# Patient Record
Sex: Female | Born: 1950 | Race: White | Hispanic: No | Marital: Married | Smoking: Never smoker
Health system: Southern US, Community
[De-identification: ages and names within clinical notes are randomized; demographics above are authoritative.]

## PROBLEM LIST (undated history)

## (undated) DIAGNOSIS — F329 Major depressive disorder, single episode, unspecified: Secondary | ICD-10-CM

## (undated) DIAGNOSIS — I10 Essential (primary) hypertension: Secondary | ICD-10-CM

## (undated) DIAGNOSIS — F32A Depression, unspecified: Secondary | ICD-10-CM

## (undated) DIAGNOSIS — R002 Palpitations: Secondary | ICD-10-CM

## (undated) DIAGNOSIS — K769 Liver disease, unspecified: Secondary | ICD-10-CM

## (undated) DIAGNOSIS — E785 Hyperlipidemia, unspecified: Secondary | ICD-10-CM

## (undated) DIAGNOSIS — M8580 Other specified disorders of bone density and structure, unspecified site: Secondary | ICD-10-CM

## (undated) HISTORY — DX: Liver disease, unspecified: K76.9

## (undated) HISTORY — DX: Major depressive disorder, single episode, unspecified: F32.9

## (undated) HISTORY — DX: Hyperlipidemia, unspecified: E78.5

## (undated) HISTORY — DX: Palpitations: R00.2

## (undated) HISTORY — DX: Depression, unspecified: F32.A

## (undated) HISTORY — PX: THYROIDECTOMY, PARTIAL: SHX18

## (undated) HISTORY — DX: Other specified disorders of bone density and structure, unspecified site: M85.80

## (undated) HISTORY — DX: Essential (primary) hypertension: I10

---

## 2014-06-18 ENCOUNTER — Telehealth: Payer: Self-pay | Admitting: Cardiovascular Disease

## 2014-06-18 NOTE — Telephone Encounter (Signed)
Received records from [facility] (Dr Christena FlakeWilliam Zimmer) for appointment with Dr Tresa EndoKelly on 07/26/14.  [facility] for Dr Landry DykeKelly's schedule on 07/16/14.  lp

## 2014-07-26 ENCOUNTER — Encounter: Payer: Self-pay | Admitting: Cardiovascular Disease

## 2014-07-26 ENCOUNTER — Ambulatory Visit (INDEPENDENT_AMBULATORY_CARE_PROVIDER_SITE_OTHER): Payer: Medicare Other | Admitting: Cardiovascular Disease

## 2014-07-26 VITALS — BP 160/80 | HR 68 | Ht 62.0 in | Wt 147.2 lb

## 2014-07-26 DIAGNOSIS — E890 Postprocedural hypothyroidism: Secondary | ICD-10-CM

## 2014-07-26 DIAGNOSIS — R079 Chest pain, unspecified: Secondary | ICD-10-CM

## 2014-07-26 DIAGNOSIS — Z7901 Long term (current) use of anticoagulants: Secondary | ICD-10-CM

## 2014-07-26 DIAGNOSIS — Z9889 Other specified postprocedural states: Secondary | ICD-10-CM

## 2014-07-26 DIAGNOSIS — Z8659 Personal history of other mental and behavioral disorders: Secondary | ICD-10-CM

## 2014-07-26 DIAGNOSIS — Z79899 Other long term (current) drug therapy: Secondary | ICD-10-CM

## 2014-07-26 DIAGNOSIS — Z01818 Encounter for other preprocedural examination: Secondary | ICD-10-CM

## 2014-07-26 DIAGNOSIS — Z9009 Acquired absence of other part of head and neck: Secondary | ICD-10-CM

## 2014-07-26 DIAGNOSIS — R0789 Other chest pain: Secondary | ICD-10-CM

## 2014-07-26 DIAGNOSIS — R5383 Other fatigue: Secondary | ICD-10-CM

## 2014-07-26 DIAGNOSIS — R9431 Abnormal electrocardiogram [ECG] [EKG]: Secondary | ICD-10-CM

## 2014-07-26 DIAGNOSIS — I10 Essential (primary) hypertension: Secondary | ICD-10-CM

## 2014-07-26 LAB — CBC
HCT: 39.1 % (ref 36.0–46.0)
HEMOGLOBIN: 13.7 g/dL (ref 12.0–15.0)
MCH: 30.7 pg (ref 26.0–34.0)
MCHC: 35 g/dL (ref 30.0–36.0)
MCV: 87.7 fL (ref 78.0–100.0)
MPV: 9.7 fL (ref 8.6–12.4)
Platelets: 292 10*3/uL (ref 150–400)
RBC: 4.46 MIL/uL (ref 3.87–5.11)
RDW: 13.2 % (ref 11.5–15.5)
WBC: 11.9 10*3/uL — ABNORMAL HIGH (ref 4.0–10.5)

## 2014-07-26 MED ORDER — AMLODIPINE BESYLATE 5 MG PO TABS: 5.0000 mg | ORAL_TABLET | Freq: Every day | ORAL | Status: DC

## 2014-07-26 NOTE — Patient Instructions (Addendum)
START Amlodipine 5mg  daily.  This has been sent to [facility] in [place].  If you have no side effects please call after 2 weeks and we can send it to your mail order pharmacy.  Your physician has requested that you have a cardiac catheterization by Dr. Tresa EndoKelly. Cardiac catheterization is used to diagnose and/or treat various heart conditions. Doctors may recommend this procedure for a number of different reasons. The most common reason is to evaluate chest pain. Chest pain can be a symptom of coronary artery disease (CAD), and cardiac catheterization can show whether plaque is narrowing or blocking your heart's arteries. This procedure is also used to evaluate the valves, as well as measure the blood flow and oxygen levels in different parts of your heart. For further information please visit [email or web address] Please follow instruction sheet, as given.  Your physician recommends that you return for lab work in: 3-7 days prior to the cardiac cath along with a chest x-ray.(will order CXR day of cath.

## 2014-07-27 LAB — COMPREHENSIVE METABOLIC PANEL
ALBUMIN: 4.1 g/dL (ref 3.5–5.2)
ALK PHOS: 68 U/L (ref 39–117)
ALT: 51 U/L — ABNORMAL HIGH (ref 0–35)
AST: 40 U/L — ABNORMAL HIGH (ref 0–37)
BILIRUBIN TOTAL: 0.3 mg/dL (ref 0.2–1.2)
BUN: 23 mg/dL (ref 6–23)
CO2: 27 mEq/L (ref 19–32)
Calcium: 9.4 mg/dL (ref 8.4–10.5)
Chloride: 103 mEq/L (ref 96–112)
Creat: 1.14 mg/dL — ABNORMAL HIGH (ref 0.50–1.10)
GLUCOSE: 97 mg/dL (ref 70–99)
Potassium: 4.2 mEq/L (ref 3.5–5.3)
Sodium: 138 mEq/L (ref 135–145)
TOTAL PROTEIN: 6.9 g/dL (ref 6.0–8.3)

## 2014-07-27 LAB — TSH: TSH: 1.264 u[IU]/mL (ref 0.350–4.500)

## 2014-07-27 LAB — PROTIME-INR
INR: 1.02 (ref <1.50)
Prothrombin Time: 13.4 seconds (ref 11.6–15.2)

## 2014-07-27 LAB — APTT: aPTT: 29 seconds (ref 24–37)

## 2014-07-28 ENCOUNTER — Encounter: Payer: Self-pay | Admitting: Cardiovascular Disease

## 2014-07-28 DIAGNOSIS — R9431 Abnormal electrocardiogram [ECG] [EKG]: Secondary | ICD-10-CM | POA: Insufficient documentation

## 2014-07-28 DIAGNOSIS — I10 Essential (primary) hypertension: Secondary | ICD-10-CM | POA: Insufficient documentation

## 2014-07-28 DIAGNOSIS — E890 Postprocedural hypothyroidism: Secondary | ICD-10-CM | POA: Insufficient documentation

## 2014-07-28 DIAGNOSIS — R079 Chest pain, unspecified: Secondary | ICD-10-CM | POA: Insufficient documentation

## 2014-07-28 DIAGNOSIS — Z8659 Personal history of other mental and behavioral disorders: Secondary | ICD-10-CM | POA: Insufficient documentation

## 2014-07-28 DIAGNOSIS — Z9009 Acquired absence of other part of head and neck: Secondary | ICD-10-CM | POA: Insufficient documentation

## 2014-07-28 NOTE — Progress Notes (Addendum)
Patient ID: [name], female   DOB: 17-Jan-1951, 64 y.o.   MRN: [id number]     PATIENT PROFILE: [name] is a 64 y.o. female who is followed by Dr. Tanda Rockers in [place], [place].  She was referred for cardiology evaluation.  Her relative, [name], is one of my patients.   HPI:  [name] has a history of hypertension dating back for at least 2002, and most recently she has been maintained on lisinopril 10 mg and Ziac 5/6.25 mg daily.  She has experienced episodic chest pressure intermittently and had initially undergone a stress test a  7 years ago.  She also has a history of depression.  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  Recently, she has noticed increasing palpitations and also has developed episodes of chest tightness which seems to be occurring with less walking.  She underwent a nuclear perfusion study in July 2015 which was done as a Freight forwarder stress.  There were no symptoms during the study.  She was felt to have a small fixed anteroseptal defect without ischemia.  Ejection fraction was 39%, but was felt that this may not be accurate due to many PVCs during the study.  She subsequently underwent a 2-D echo Doppler study in October 2015 in [place] , which demonstrated an ejection fraction at 55-60%.  There was mild left ventricular posterior wall thickness.  Check equivocal tissue Doppler assessment.  The peak pressure gradient across her aortic valve was 7.6 mm with a mean pressure gradient of 4.3 mm without evidence for significant aortic stenosis.  The patient states that recently she has noticed more chest tightness and this seems to occur with only walking halfway down her driveway.  She now presents for cardiology evaluation.   Past medical history is notable for hypertension, palpitations, depressive disorder, osteoarthritis.   Past surgical history is notable for partial thyroidectomy while she was in eighth  grade.   Current Outpatient Prescriptions  Medication Sig Dispense Refill  . amLODipine (NORVASC) 5 MG tablet Take 1 tablet (5 mg total) by mouth daily. 30 tablet 3  . Ascorbic Acid (VITAMIN C) 1000 MG tablet Take 1,000 mg by mouth daily.    [name] BIOTIN 5000 PO Take by mouth.    . bisoprolol-hydrochlorothiazide (ZIAC) 5-6.25 MG per tablet Take by mouth.    . Calcium Carbonate (CALCIUM 600 PO) Take by mouth.    . Cholecalciferol (VITAMIN D-3 PO) Take by mouth.    . diclofenac (VOLTAREN) 50 MG EC tablet Take by mouth.    [name] lisinopril (PRINIVIL,ZESTRIL) 10 MG tablet Take by mouth.    . Omega-3 Fatty Acids (FISH OIL PO) Take by mouth daily.    . TURMERIC CURCUMIN PO Take by mouth daily.     No current facility-administered medications for this visit.   Social history is notable in that she is married for 39 years.  She does not have any children.  She lives with her husband.  She completed 12th grade of education.  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  There is no tobacco history or alcohol use.  She does care for many dogs.  Family History  Problem Relation Age of Onset  . Heart failure Mother    Family history is notable that her mother died at age 42 with possible congestive heart failure.  Her father died at 17 with sudden death.  He had COPD.  A brother died at 70 with  cancer and a sister died at 7656 with cancer.   ROS General: Negative; No fevers, chills, or night sweats HEENT: Negative; No changes in vision or hearing, sinus congestion, difficulty swallowing Pulmonary: Negative; No cough, wheezing, shortness of breath, hemoptysis Cardiovascular:  See HPI;  GI: Negative; No nausea, vomiting, diarrhea, or abdominal pain GU: Negative; No dysuria, hematuria, or difficulty voiding Musculoskeletal: Negative; no myalgias, joint pain, or weakness Hematologic/Oncologic: Negative; no easy bruising, bleeding Endocrine: History of partial  thyroidectomy Neuro: Negative; no changes in balance, headaches Skin: Negative; No rashes or skin lesions Psychiatric: Prior history of depression, currently stable Sleep: Negative; No daytime sleepiness, hypersomnolence, bruxism, restless legs, hypnogagnic hallucinations Other comprehensive 14 point system review is negative   Physical Exam BP 160/80 mmHg  Pulse 68  Ht 5\' 2"  (1.575 m)  Wt 147 lb 3.2 oz (66.769 kg)  BMI 26.92 kg/m2  General: Alert, oriented, no distress.  Skin: normal turgor, no rashes, warm and dry HEENT: Normocephalic, atraumatic. Pupils equal round and reactive to light; sclera anicteric; extraocular muscles intact; Fundi mild arteriolar narrowing without hemorrhages or exudates Nose without nasal septal hypertrophy Mouth/Parynx benign; Mallinpatti scale 3 Neck: No JVD, no carotid bruits; normal carotid upstroke Lungs: clear to ausculatation and percussion; no wheezing or rales Chest wall: without tenderness to palpitation Heart: PMI not displaced, RRR, s1 s2 normal, 1/6 systolic murmur, no diastolic murmur, no rubs, gallops, thrills, or heaves Abdomen: soft, nontender; no hepatosplenomehaly, BS+; abdominal aorta nontender and not dilated by palpation. Back: no CVA tenderness Pulses 2+ Musculoskeletal: full range of motion, normal strength, no joint deformities Extremities: no clubbing cyanosis or edema, Homan's sign negative  Neurologic: grossly nonfocal; Cranial nerves grossly wnl Psychologic: Normal mood and affect   ECG (independently read by me): Normal sinus rhythm at 68 bpm.  There is mild downsloping ST segment depression in leads II, III, and F V4 through V6, which is slightly more progressive since the patient's prior ECG done in [place].  LABS:  BMET    Component Value Date/Time   NA 138 07/26/2014 1553   K 4.2 07/26/2014 1553   CL 103 07/26/2014 1553   CO2 27 07/26/2014 1553   GLUCOSE 97 07/26/2014 1553   BUN 23 07/26/2014 1553    CREATININE 1.14* 07/26/2014 1553   CALCIUM 9.4 07/26/2014 1553     Hepatic Function Panel     Component Value Date/Time   PROT 6.9 07/26/2014 1553   ALBUMIN 4.1 07/26/2014 1553   AST 40* 07/26/2014 1553   ALT 51* 07/26/2014 1553   ALKPHOS 68 07/26/2014 1553   BILITOT 0.3 07/26/2014 1553     CBC    Component Value Date/Time   WBC 11.9* 07/26/2014 1553   RBC 4.46 07/26/2014 1553   HGB 13.7 07/26/2014 1553   HCT 39.1 07/26/2014 1553   PLT 292 07/26/2014 1553   MCV 87.7 07/26/2014 1553   MCH 30.7 07/26/2014 1553   MCHC 35.0 07/26/2014 1553   RDW 13.2 07/26/2014 1553     BNP No results found for: PROBNP  Lipid Panel  No results found for: CHOL, TRIG, HDL, CHOLHDL, VLDL, LDLCALC, LDLDIRECT    RADIOLOGY: No results found.   ASSESSMENT AND PLAN: My impression is that Ms.[name] is a [age] year old female who has at least a 13 year history of hypertension and currently has been on lisinopril 10 mg and bisoprolol/HCTZ 5/6.25 mg.  Her blood pressure today is elevated at 160/80.  She also has experienced progressive chest tightness.  A  prior nuclear perfusion study in July, raised the possibility of a fixed defect anteroseptal lead is felt that this was possibly due to left bundle branch block.  However, I reviewed the ECGs in the patient did not have left bundle branch block at the time of the stress test.  She did have mild RV conduction delay.  Her echo Doppler study has shown normal systolic function.  Her ECG today does show slight  progressive inferolateral ST segment changes.  She admits to fairly reproducible chest tightness and ultimately this does get better with activity if she walks slowly.  She does have mild mitral regurgitation.  I am electing to add amlodipine 5 mg, which be helpful both for her blood pressure control as well as potential for anti-ischemic benefit.  In light of her symptoms which may be compatible with ischemia and her ECG changes, I have recommended  definitive cardiac evaluation with cardiac catheterization.  At this point, I do not feel there is a need to repeat noninvasive assessment. The risks and benefits of a cardiac catheterization and possible coronary percutaneous intervention including, but not limited to, death, stroke, MI, kidney damage and bleeding were discussed with the patient who indicates understanding and agrees to proceed with this procedure for definitive assessment.  If stenting is necessary, there are no contraindications to DES stenting.  Laboratory will be sent.  I answered all her questions.  The catheterization will be scheduled at [facility] next week.   Lennette Bihari, MD, [facility] 07/28/2014 12:35 PM

## 2014-07-31 ENCOUNTER — Other Ambulatory Visit: Payer: Self-pay | Admitting: *Deleted

## 2014-07-31 DIAGNOSIS — R079 Chest pain, unspecified: Secondary | ICD-10-CM

## 2014-07-31 DIAGNOSIS — R0602 Shortness of breath: Secondary | ICD-10-CM

## 2014-08-03 ENCOUNTER — Ambulatory Visit (HOSPITAL_COMMUNITY)
Admission: RE | Admit: 2014-08-03 | Discharge: 2014-08-03 | Disposition: A | Discharge status: Home / Self Care | Payer: Medicare Other | Source: Ambulatory Visit | Attending: Cardiovascular Disease | Admitting: Cardiovascular Disease

## 2014-08-03 ENCOUNTER — Encounter (HOSPITAL_COMMUNITY): Payer: Self-pay | Admitting: Cardiovascular Disease

## 2014-08-03 ENCOUNTER — Encounter (HOSPITAL_COMMUNITY)
Admission: RE | Disposition: A | Discharge status: Home / Self Care | Payer: Self-pay | Source: Ambulatory Visit | Attending: Cardiovascular Disease

## 2014-08-03 ENCOUNTER — Ambulatory Visit (HOSPITAL_COMMUNITY): Payer: Medicare Other

## 2014-08-03 DIAGNOSIS — I2510 Atherosclerotic heart disease of native coronary artery without angina pectoris: Secondary | ICD-10-CM | POA: Diagnosis not present

## 2014-08-03 DIAGNOSIS — F329 Major depressive disorder, single episode, unspecified: Secondary | ICD-10-CM | POA: Diagnosis not present

## 2014-08-03 DIAGNOSIS — Z79899 Other long term (current) drug therapy: Secondary | ICD-10-CM | POA: Diagnosis not present

## 2014-08-03 DIAGNOSIS — R9431 Abnormal electrocardiogram [ECG] [EKG]: Secondary | ICD-10-CM | POA: Insufficient documentation

## 2014-08-03 DIAGNOSIS — M1990 Unspecified osteoarthritis, unspecified site: Secondary | ICD-10-CM | POA: Insufficient documentation

## 2014-08-03 DIAGNOSIS — R0602 Shortness of breath: Secondary | ICD-10-CM | POA: Insufficient documentation

## 2014-08-03 DIAGNOSIS — R0789 Other chest pain: Secondary | ICD-10-CM

## 2014-08-03 DIAGNOSIS — R002 Palpitations: Secondary | ICD-10-CM | POA: Diagnosis not present

## 2014-08-03 DIAGNOSIS — R079 Chest pain, unspecified: Secondary | ICD-10-CM | POA: Diagnosis present

## 2014-08-03 DIAGNOSIS — I341 Nonrheumatic mitral (valve) prolapse: Secondary | ICD-10-CM | POA: Insufficient documentation

## 2014-08-03 DIAGNOSIS — I10 Essential (primary) hypertension: Secondary | ICD-10-CM | POA: Diagnosis not present

## 2014-08-03 DIAGNOSIS — R001 Bradycardia, unspecified: Secondary | ICD-10-CM | POA: Insufficient documentation

## 2014-08-03 HISTORY — PX: LEFT HEART CATHETERIZATION WITH CORONARY ANGIOGRAM: SHX5451

## 2014-08-03 LAB — CK TOTAL AND CKMB (NOT AT ARMC)
CK, MB: 1.9 ng/mL (ref 0.3–4.0)
RELATIVE INDEX: INVALID (ref 0.0–2.5)
Total CK: 60 U/L (ref 7–177)

## 2014-08-03 SURGERY — LEFT HEART CATHETERIZATION WITH CORONARY ANGIOGRAM
Anesthesia: LOCAL

## 2014-08-03 MED ORDER — FENTANYL CITRATE 0.05 MG/ML IJ SOLN
INTRAMUSCULAR | Status: AC
  Filled 2014-08-03: qty 2

## 2014-08-03 MED ORDER — SODIUM CHLORIDE 0.9 % IV SOLN
INTRAVENOUS | Status: DC
  Administered 2014-08-03: 10:00:00 via INTRAVENOUS

## 2014-08-03 MED ORDER — SODIUM CHLORIDE 0.9 % IJ SOLN
3.0000 mL | INTRAMUSCULAR | Status: DC | PRN
  Administered 2014-08-03: 3 mL via INTRAVENOUS
  Filled 2014-08-03: qty 3

## 2014-08-03 MED ORDER — NITROGLYCERIN 1 MG/10 ML FOR IR/CATH LAB
INTRA_ARTERIAL | Status: AC
  Filled 2014-08-03: qty 10

## 2014-08-03 MED ORDER — HEPARIN (PORCINE) IN NACL 2-0.9 UNIT/ML-% IJ SOLN
INTRAMUSCULAR | Status: AC
  Filled 2014-08-03: qty 1500

## 2014-08-03 MED ORDER — SODIUM CHLORIDE 0.9 % IV SOLN: INTRAVENOUS | Status: DC

## 2014-08-03 MED ORDER — HEPARIN SODIUM (PORCINE) 1000 UNIT/ML IJ SOLN
INTRAMUSCULAR | Status: AC
  Filled 2014-08-03: qty 1

## 2014-08-03 MED ORDER — DIAZEPAM 5 MG PO TABS
5.0000 mg | ORAL_TABLET | ORAL | Status: AC
Start: 2014-08-03 — End: 2014-08-03
  Administered 2014-08-03: 5 mg via ORAL

## 2014-08-03 MED ORDER — VERAPAMIL HCL 2.5 MG/ML IV SOLN
INTRAVENOUS | Status: AC
  Filled 2014-08-03: qty 2

## 2014-08-03 MED ORDER — ASPIRIN 81 MG PO CHEW
CHEWABLE_TABLET | ORAL | Status: AC
  Administered 2014-08-03: 81 mg via ORAL
  Filled 2014-08-03: qty 1

## 2014-08-03 MED ORDER — MIDAZOLAM HCL 2 MG/2ML IJ SOLN
INTRAMUSCULAR | Status: AC
Start: 2014-08-03 — End: 2014-08-03
  Filled 2014-08-03: qty 2

## 2014-08-03 MED ORDER — ACETAMINOPHEN 325 MG PO TABS: 650.0000 mg | ORAL_TABLET | ORAL | Status: DC | PRN

## 2014-08-03 MED ORDER — ASPIRIN 81 MG PO CHEW
81.0000 mg | CHEWABLE_TABLET | ORAL | Status: AC
  Administered 2014-08-03: 81 mg via ORAL

## 2014-08-03 MED ORDER — ONDANSETRON HCL 4 MG/2ML IJ SOLN: 4.0000 mg | Freq: Four times a day (QID) | INTRAMUSCULAR | Status: DC | PRN

## 2014-08-03 MED ORDER — MIDAZOLAM HCL 2 MG/2ML IJ SOLN
INTRAMUSCULAR | Status: AC
  Filled 2014-08-03: qty 2

## 2014-08-03 MED ORDER — DIAZEPAM 5 MG PO TABS
ORAL_TABLET | ORAL | Status: AC
  Administered 2014-08-03: 5 mg via ORAL
  Filled 2014-08-03: qty 1

## 2014-08-03 MED ORDER — ASPIRIN EC 81 MG PO TBEC: 81.0000 mg | DELAYED_RELEASE_TABLET | Freq: Every day | ORAL | Status: DC

## 2014-08-03 NOTE — CV Procedure (Addendum)
[name] Sees is a 64 y.o. female   [id number]  [id number] LOCATION:  FACILITY: [facility]  PHYSICIAN: Lennette Biharihomas A. Kelly, MD, [facility] 1950-11-17   DATE OF PROCEDURE:  08/03/2014     CARDIAC CATHETERIZATION    HISTORY:  Ms. Rebbeca Paulancy Kaiser is a 64 year old female who is followed by Dr. Tanda RockersZimmer in [place], [place].  She has a long-standing history of hypertension, a history of palpitations, and recently had developed some exertional shortness of breath and episodes of chest tightness with walking.  A nuclear perfusion study raised the possibility of a small fixed anteroseptal defect without ischemia.  She has continued to experience symptoms.  She is now referred for definitive cardiac catheterization.   PROCEDURE:  Left heart catheterization via the right radial artery approach: Coronary angiography, selective administration of intracoronary nitroglycerin into the left coronary system, left ventriculography.  The patient was brought to the second floor [facility] cath lab in the postabsorptive state. The patient was premedicated with Versed 3 mg and fentanyl 75 mcg. A right radial approach was utilized after an Allen's test verified adequate circulation. The right radial artery was punctured via the Seldinger technique, and a 6 [place] Glidesheath Slender was inserted without difficulty.  A radial cocktail consisting of Verapamil, IV nitroglycerin, and lidocaine was administered. Weight adjusted heparin was administered. A safety J wire was advanced into the ascending aorta. Diagnostic catheterization was done with a 5 [place] TIG 4.0 catheter.  A 5 French pigtail catheter was used for left ventriculography. A TR radial band was applied for hemostasis. The patient left the catheterization laboratory in stable condition.   HEMODYNAMICS:   Central Aorta: 145/58  Left Ventricle: 145/17  ANGIOGRAPHY:   The left main coronary artery was angiographically normal and trifurcated into the  LAD, ramus intermediate and left circumflex coronary artery.   The LAD was a moderate size vessel which gave rise to a proximal diagonal vessel and several septal perforating arteries. The vessel extended to the LV apex.  There was a smooth area of 40-50% narrowing in the LAD after this first diagonal takeoff.  2 g of intracoronary nitroglycerin were injected which did not demonstrate any resolution of this smooth narrowing.  The remainder of the LAD was smooth and free of disease.  Ramus intermediate vessel is a moderate sized normal vessel  The left circumflex coronary artery was angiographically normal and gave rise to one major bifurcating obtuse marginal branch.   The RCA was angiographically normal it gave rise to a large PDA and PLA vessel.   Left ventriculography revealed normal global LV contractility without focal segmental wall motion abnormalities.  He was a suggestion of very mild angiographic mitral valve prolapse.  There was no evidence for significant mitral regurgitation. Ejection fraction is  55-60%   Total contrast used: 70 cc Omnipaque  IMPRESSION:  Mild single-vessel coronary obstructive disease with smooth 40-50% proximal LAD stenosis after the takeoff of the first diagonal vessel which did not significantly normalize following intracoronary nitroglycerin administration; normal ramus intermediate, left circumflex, and RCA.  Normal LV function without wall motion abnormalities.  Very mild angiographic mitral valve prolapse without significant mitral regurgitation   RECOMMENDATION:  Medical therapy.   Lennette Biharihomas A. Kelly, MD, [facility] 08/03/2014 12:50 PM

## 2014-08-03 NOTE — Interval H&P Note (Signed)
Cath Lab Visit (complete for each Cath Lab visit)  Clinical Evaluation Leading to the Procedure:   ACS: No.  Non-ACS:    Anginal Classification: CCS II  Anti-ischemic medical therapy: Maximal Therapy (2 or more classes of medications)  Non-Invasive Test Results: Intermediate-risk stress test findings: cardiac mortality 1-3%/year  Prior CABG: No previous CABG      History and Physical Interval Note:  08/03/2014 12:01 PM  Sarah MorningNancy Kaiser  has presented today for surgery, with the diagnosis of cp  The various methods of treatment have been discussed with the patient and family. After consideration of risks, benefits and other options for treatment, the patient has consented to  Procedure(s): LEFT HEART CATHETERIZATION WITH CORONARY ANGIOGRAM (N/A) as a surgical intervention .  The patient's history has been reviewed, patient examined, no change in status, stable for surgery.  I have reviewed the patient's chart and labs.  Questions were answered to the patient's satisfaction.     Starlynn Klinkner A

## 2014-08-03 NOTE — Discharge Instructions (Signed)
Radial Site Care °Refer to this sheet in the next few weeks. These instructions provide you with information on caring for yourself after your procedure. Your caregiver may also give you more specific instructions. Your treatment has been planned according to current medical practices, but problems sometimes occur. Call your caregiver if you have any problems or questions after your procedure. °HOME CARE INSTRUCTIONS °· You may shower the day after the procedure. Remove the bandage (dressing) and gently wash the site with plain soap and water. Gently pat the site dry. °· Do not apply powder or lotion to the site. °· Do not submerge the affected site in water for 3 to 5 days. °· Inspect the site at least twice daily. °· Do not flex or bend the affected arm for 24 hours. °· No lifting over 5 pounds (2.3 kg) for 5 days after your procedure. °· Do not drive home if you are discharged the same day of the procedure. Have someone else drive you. °· You may drive 24 hours after the procedure unless otherwise instructed by your caregiver. °· Do not operate machinery or power tools for 24 hours. °· A responsible adult should be with you for the first 24 hours after you arrive home. °What to expect: °· Any bruising will usually fade within 1 to 2 weeks. °· Blood that collects in the tissue (hematoma) may be painful to the touch. It should usually decrease in size and tenderness within 1 to 2 weeks. °SEEK IMMEDIATE MEDICAL CARE IF: °· You have unusual pain at the radial site. °· You have redness, warmth, swelling, or pain at the radial site. °· You have drainage (other than a small amount of blood on the dressing). °· You have chills. °· You have a fever or persistent symptoms for more than 72 hours. °· You have a fever and your symptoms suddenly get worse. °· Your arm becomes pale, cool, tingly, or numb. °· You have heavy bleeding from the site. Hold pressure on the site. °Document Released: 08/15/2010 Document Revised:  10/05/2011 Document Reviewed: 08/15/2010 °ExitCare® Patient Information ©2015 [employer]. This information is not intended to replace advice given to you by your health care provider. Make sure you discuss any questions you have with your health care provider. ° °

## 2014-08-03 NOTE — Progress Notes (Signed)
TR BAND REMOVAL  LOCATION:    right radial  DEFLATED PER PROTOCOL:    Yes.    TIME BAND OFF / DRESSING APPLIED:    1430   SITE UPON ARRIVAL:    Level 0  SITE AFTER BAND REMOVAL:    Level 0  CIRCULATION SENSATION AND MOVEMENT:    Within Normal Limits   Yes.    COMMENTS:   TRB REMOVED/ TEGADERM DSG APPLIED. 

## 2014-08-03 NOTE — H&P (View-Only) (Signed)
Patient ID: [name], female   DOB: 17-Jan-1951, 64 y.o.   MRN: [id number]     PATIENT PROFILE: [name] is a 64 y.o. female who is followed by Dr. Tanda Rockers in [place], [place].  She was referred for cardiology evaluation.  Her relative, [name], is one of my patients.   HPI:  [name] has a history of hypertension dating back for at least 2002, and most recently she has been maintained on lisinopril 10 mg and Ziac 5/6.25 mg daily.  She has experienced episodic chest pressure intermittently and had initially undergone a stress test a  7 years ago.  She also has a history of depression.  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  Recently, she has noticed increasing palpitations and also has developed episodes of chest tightness which seems to be occurring with less walking.  She underwent a nuclear perfusion study in July 2015 which was done as a Freight forward[employer] stress.  There were no symptoms during the study.  She was felt to have a small fixed anteroseptal defect without ischemia.  Ejection fraction was 39%, but was felt that this may not be accurate due to many PVCs during the study.  She subsequently underwent a 2-D echo Doppler study in October 2015 in [place] , which demonstrated an ejection fraction at 55-60%.  There was mild left ventricular posterior wall thickness.  Check equivocal tissue Doppler assessment.  The peak pressure gradient across her aortic valve was 7.6 mm with a mean pressure gradient of 4.3 mm without evidence for significant aortic stenosis.  The patient states that recently she has noticed more chest tightness and this seems to occur with only walking halfway down her driveway.  She now presents for cardiology evaluation.   Past medical history is notable for hypertension, palpitations, depressive disorder, osteoarthritis.   Past surgical history is notable for partial thyroidectomy while she was in eighth  grade.   Current Outpatient Prescriptions  Medication Sig Dispense Refill  . amLODipine (NORVASC) 5 MG tablet Take 1 tablet (5 mg total) by mouth daily. 30 tablet 3  . Ascorbic Acid (VITAMIN C) 1000 MG tablet Take 1,000 mg by mouth daily.    [name] BIOTIN 5000 PO Take by mouth.    . bisoprolol-hydrochlorothiazide (ZIAC) 5-6.25 MG per tablet Take by mouth.    . Calcium Carbonate (CALCIUM 600 PO) Take by mouth.    . Cholecalciferol (VITAMIN D-3 PO) Take by mouth.    . diclofenac (VOLTAREN) 50 MG EC tablet Take by mouth.    [name] lisinopril (PRINIVIL,ZESTRIL) 10 MG tablet Take by mouth.    . Omega-3 Fatty Acids (FISH OIL PO) Take by mouth daily.    . TURMERIC CURCUMIN PO Take by mouth daily.     No current facility-administered medications for this visit.   Social history is notable in that she is married for 39 years.  She does not have any children.  She lives with her husband.  She completed 12th grade of education.  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  There is no tobacco history or alcohol use.  She does care for many dogs.  Family History  Problem Relation Age of Onset  . Heart failure Mother    Family history is notable that her mother died at age 42 with possible congestive heart failure.  Her father died at 17 with sudden death.  He had COPD.  A brother died at 70 with  cancer and a sister died at 7656 with cancer.   ROS General: Negative; No fevers, chills, or night sweats HEENT: Negative; No changes in vision or hearing, sinus congestion, difficulty swallowing Pulmonary: Negative; No cough, wheezing, shortness of breath, hemoptysis Cardiovascular:  See HPI;  GI: Negative; No nausea, vomiting, diarrhea, or abdominal pain GU: Negative; No dysuria, hematuria, or difficulty voiding Musculoskeletal: Negative; no myalgias, joint pain, or weakness Hematologic/Oncologic: Negative; no easy bruising, bleeding Endocrine: History of partial  thyroidectomy Neuro: Negative; no changes in balance, headaches Skin: Negative; No rashes or skin lesions Psychiatric: Prior history of depression, currently stable Sleep: Negative; No daytime sleepiness, hypersomnolence, bruxism, restless legs, hypnogagnic hallucinations Other comprehensive 14 point system review is negative   Physical Exam BP 160/80 mmHg  Pulse 68  Ht 5\' 2"  (1.575 m)  Wt 147 lb 3.2 oz (66.769 kg)  BMI 26.92 kg/m2  General: Alert, oriented, no distress.  Skin: normal turgor, no rashes, warm and dry HEENT: Normocephalic, atraumatic. Pupils equal round and reactive to light; sclera anicteric; extraocular muscles intact; Fundi mild arteriolar narrowing without hemorrhages or exudates Nose without nasal septal hypertrophy Mouth/Parynx benign; Mallinpatti scale 3 Neck: No JVD, no carotid bruits; normal carotid upstroke Lungs: clear to ausculatation and percussion; no wheezing or rales Chest wall: without tenderness to palpitation Heart: PMI not displaced, RRR, s1 s2 normal, 1/6 systolic murmur, no diastolic murmur, no rubs, gallops, thrills, or heaves Abdomen: soft, nontender; no hepatosplenomehaly, BS+; abdominal aorta nontender and not dilated by palpation. Back: no CVA tenderness Pulses 2+ Musculoskeletal: full range of motion, normal strength, no joint deformities Extremities: no clubbing cyanosis or edema, Homan's sign negative  Neurologic: grossly nonfocal; Cranial nerves grossly wnl Psychologic: Normal mood and affect   ECG (independently read by me): Normal sinus rhythm at 68 bpm.  There is mild downsloping ST segment depression in leads II, III, and F V4 through V6, which is slightly more progressive since the patient's prior ECG done in [place].  LABS:  BMET    Component Value Date/Time   NA 138 07/26/2014 1553   K 4.2 07/26/2014 1553   CL 103 07/26/2014 1553   CO2 27 07/26/2014 1553   GLUCOSE 97 07/26/2014 1553   BUN 23 07/26/2014 1553    CREATININE 1.14* 07/26/2014 1553   CALCIUM 9.4 07/26/2014 1553     Hepatic Function Panel     Component Value Date/Time   PROT 6.9 07/26/2014 1553   ALBUMIN 4.1 07/26/2014 1553   AST 40* 07/26/2014 1553   ALT 51* 07/26/2014 1553   ALKPHOS 68 07/26/2014 1553   BILITOT 0.3 07/26/2014 1553     CBC    Component Value Date/Time   WBC 11.9* 07/26/2014 1553   RBC 4.46 07/26/2014 1553   HGB 13.7 07/26/2014 1553   HCT 39.1 07/26/2014 1553   PLT 292 07/26/2014 1553   MCV 87.7 07/26/2014 1553   MCH 30.7 07/26/2014 1553   MCHC 35.0 07/26/2014 1553   RDW 13.2 07/26/2014 1553     BNP No results found for: PROBNP  Lipid Panel  No results found for: CHOL, TRIG, HDL, CHOLHDL, VLDL, LDLCALC, LDLDIRECT    RADIOLOGY: No results found.   ASSESSMENT AND PLAN: My impression is that Ms.[name] is a [age] year old female who has at least a 13 year history of hypertension and currently has been on lisinopril 10 mg and bisoprolol/HCTZ 5/6.25 mg.  Her blood pressure today is elevated at 160/80.  She also has experienced progressive chest tightness.  A  prior nuclear perfusion study in July, raised the possibility of a fixed defect anteroseptal lead is felt that this was possibly due to left bundle branch block.  However, I reviewed the ECGs in the patient did not have left bundle branch block at the time of the stress test.  She did have mild RV conduction delay.  Her echo Doppler study has shown normal systolic function.  Her ECG today does show slight  progressive inferolateral ST segment changes.  She admits to fairly reproducible chest tightness and ultimately this does get better with activity if she walks slowly.  She does have mild mitral regurgitation.  I am electing to add amlodipine 5 mg, which be helpful both for her blood pressure control as well as potential for anti-ischemic benefit.  In light of her symptoms which may be compatible with ischemia and her ECG changes, I have recommended  definitive cardiac evaluation with cardiac catheterization.  At this point, I do not feel there is a need to repeat noninvasive assessment. The risks and benefits of a cardiac catheterization and possible coronary percutaneous intervention including, but not limited to, death, stroke, MI, kidney damage and bleeding were discussed with the patient who indicates understanding and agrees to proceed with this procedure for definitive assessment.  If stenting is necessary, there are no contraindications to DES stenting.  Laboratory will be sent.  I answered all her questions.  The catheterization will be scheduled at [facility] next week.   Lennette Bihari, MD, [facility] 07/28/2014 12:35 PM

## 2014-08-03 NOTE — Research (Signed)
Bioflow Study Informed Consent   Subject Name: [name]  Subject met inclusion and exclusion criteria.  The informed consent form, study requirements and expectations were reviewed with the subject and questions and concerns were addressed prior to the signing of the consent form.  The subject verbalized understanding of the trail requirements.  The subject agreed to participate in the Bioflow trial and signed the informed consent.  The informed consent was obtained prior to performance of any protocol-specific procedures for the subject.  A copy of the signed informed consent was given to the subject and a copy was placed in the subject's medical record.  Sandie Ano 08/03/2014, 10:45

## 2014-08-06 ENCOUNTER — Telehealth: Payer: Self-pay | Admitting: Cardiovascular Disease

## 2014-08-06 MED ORDER — AMLODIPINE BESYLATE 5 MG PO TABS: 5.0000 mg | ORAL_TABLET | Freq: Every day | ORAL | Status: DC

## 2014-08-06 NOTE — Telephone Encounter (Signed)
Spoke with pt, refill sent to the pharmacy and 2 wk follow up scheduled.

## 2014-08-06 NOTE — Telephone Encounter (Signed)
Pt need a new prescription for her Amlodipine Bestylate 5 mg #90 and refills. Please send to [facility]. Pt had a Cath on Friday,when does she need her next appt?

## 2014-08-24 ENCOUNTER — Ambulatory Visit (INDEPENDENT_AMBULATORY_CARE_PROVIDER_SITE_OTHER): Payer: Medicare Other | Admitting: Cardiology

## 2014-08-24 ENCOUNTER — Encounter: Payer: Self-pay | Admitting: Cardiology

## 2014-08-24 VITALS — BP 110/70 | HR 76 | Ht 62.0 in | Wt 146.8 lb

## 2014-08-24 DIAGNOSIS — I2510 Atherosclerotic heart disease of native coronary artery without angina pectoris: Secondary | ICD-10-CM

## 2014-08-24 DIAGNOSIS — R945 Abnormal results of liver function studies: Principal | ICD-10-CM

## 2014-08-24 DIAGNOSIS — R7989 Other specified abnormal findings of blood chemistry: Secondary | ICD-10-CM

## 2014-08-24 LAB — LIPID PANEL
CHOL/HDL RATIO: 4.3 ratio
CHOLESTEROL: 187 mg/dL (ref 0–200)
HDL: 44 mg/dL (ref >39)
LDL CALC: 113 mg/dL — AB (ref 0–99)
TRIGLYCERIDES: 148 mg/dL (ref <150)
VLDL: 30 mg/dL (ref 0–40)

## 2014-08-24 LAB — HEPATIC FUNCTION PANEL
ALT: 49 U/L — AB (ref 0–35)
AST: 36 U/L (ref 0–37)
Albumin: 4.2 g/dL (ref 3.5–5.2)
Alkaline Phosphatase: 64 U/L (ref 39–117)
BILIRUBIN DIRECT: 0.1 mg/dL (ref 0.0–0.3)
BILIRUBIN INDIRECT: 0.5 mg/dL (ref 0.2–1.2)
Total Bilirubin: 0.6 mg/dL (ref 0.2–1.2)
Total Protein: 6.8 g/dL (ref 6.0–8.3)

## 2014-08-24 NOTE — Progress Notes (Signed)
08/24/2014 Sarah MorningNancy Kaiser   [birth year]  [id number]  Primary Physician: Christena FlakeZIMMER,WILLIAM, MD Primary Cardiologist: Dr. Tresa EndoKelly  Reason for visit/chief complaint: Post hospital follow-up for CAD  HPI:  The patient is a 64 y/o female, followed by Dr. Tresa EndoKelly, with a long-standing history of hypertension, a history of palpitations, and recently had developed some exertional shortness of breath and episodes of chest tightness with walking. A nuclear perfusion study raised the possibility of a small fixed anteroseptal defect without ischemia. Sbsequently, she was referred for definitive cardiac catheterization. This was performed by Dr. Tresa EndoKelly on 08/03/13 and revealed mild single-vessel coronary obstructive disease with smooth 40-50% proximal LAD stenosis after the takeoff of the first diagonal vessel which did not significantly normalize following intracoronary nitroglycerin administration; normal ramus intermediate, left circumflex, and RCA.  She was noted to have normal LV function without wall motion abnormalities and very mild angiographic mitral valve prolapse without significant mitral regurgitation.    Also of note, patient had elevated LFTs at time of CMP during hospitalization. AST was 40 and ALT was 51.    She presents to clinic today for post catheterization follow-up. She reports that she has done relatively well since discharge. She denies any recurrent chest pain however she also admits that she has not been very active due to right knee osteoarthritis. She denies shortness of breath. Her right radial cath site is well-healed without complications. She reports full medication compliance.   Current Outpatient Prescriptions  Medication Sig Dispense Refill  . alendronate (FOSAMAX) 70 MG tablet Take 70 mg by mouth once a week. Take with a full glass of water on an empty stomach.    [name]. amLODipine (NORVASC) 5 MG tablet Take 1 tablet (5 mg total) by mouth daily. 90 tablet 3  . Ascorbic Acid  (VITAMIN C) 1000 MG tablet Take 1,000 mg by mouth daily.    [name]. BIOTIN 5000 PO Take by mouth.    . bisoprolol-hydrochlorothiazide (ZIAC) 5-6.25 MG per tablet Take 1 tablet by mouth every Kaiser.     . Calcium Carbonate (CALCIUM 600 PO) Take by mouth.    . Cholecalciferol (VITAMIN D-3 PO) Take by mouth.    . diclofenac (VOLTAREN) 50 MG EC tablet Take 50 mg by mouth 2 (two) times daily.     [name]. lisinopril (PRINIVIL,ZESTRIL) 10 MG tablet Take 10 mg by mouth every Kaiser.     . Omega-3 Fatty Acids (FISH OIL PO) Take by mouth daily.    . TURMERIC CURCUMIN PO Take by mouth daily.     No current facility-administered medications for this visit.    No Known Allergies  History   Social History  . Marital Status: Married    Spouse Name: N/A    Number of Children: N/A  . Years of Education: N/A   Occupational History  . Not on file.   Social History Main Topics  . Smoking status: Never Smoker   . Smokeless tobacco: Not on file  . Alcohol Use: No  . Drug Use: No  . Sexual Activity: Not on file   Other Topics Concern  . Not on file   Social History Narrative    Family History  Problem Relation Age of Onset  . Heart failure Mother      Review of Systems: General: negative for chills, fever, night sweats or weight changes.  Cardiovascular: negative for chest pain, dyspnea on exertion, edema, orthopnea, palpitations, paroxysmal nocturnal dyspnea or shortness of breath Dermatological: negative for rash Respiratory: negative  for cough or wheezing Urologic: negative for hematuria Abdominal: negative for nausea, vomiting, diarrhea, bright red blood per rectum, melena, or hematemesis Neurologic: negative for visual changes, syncope, or dizziness All other systems reviewed and are otherwise negative except as noted above.    Blood pressure 110/70, pulse 76, height  (1.575 m), weight 146 lb 12.8 oz (66.588 kg), last menstrual period 07/27/1998.  General appearance: alert,  cooperative and no distress Neck: no carotid bruit and no JVD Lungs: clear to auscultation bilaterally Heart: regular rate and rhythm, S1, S2 normal, no murmur, click, rub or gallop Extremities: no LEE Pulses: 2+ and symmetric Skin: warm and dry Neurologic: Grossly normal  EKG not performed  ASSESSMENT AND PLAN:   1. CAD: Left heart catheterization revealed mild single-vessel coronary obstructive disease with smooth 40-50% proximal LAD stenosis. LV function was normal without wall motion abnormalities. Medical therapy was recommended. Statin therapy was not initiated due to elevated LFTs. It appears that she also has not been taking daily aspirin. Given mild disease and other risk factors, I recommend she start taking low dose daily aspirin, 81 mg. Continue beta blocker therapy with bisoprolol. Continue amlodipine. We also reviewed the concerning symptoms for acute coronary syndrome. We also discussed proper use of sublingual nitroglycerin. We will order sublingual nitroglycerin to be used as needed  2. Hypertension: Well-controlled at 110/70. Continue current medication regimen.  3. Elevated LFTs: AST was 40 and ALT was 51 4 weeks ago. Will recheck LFTs to insure that levels have normalized.  4. Screening for hyperlipidemia: Given known coronary disease, we'll check a fasting lipid panel to assess cholesterol levels. If needed, we will initiate statin therapy if LFTs have normalized.  PLAN  Start low dose daily aspirin. Continue medications for hypertension. Screening for hyperlipidemia. Recheck LFTs. We'll possibly initiate statin therapy based on lab findings. Follow-up with Dr. Tresa Endo in 3-4 months for reassessment.  Jaime Dome, BRITTAINYPA-C 08/24/2014 11:13 AM

## 2014-08-24 NOTE — Patient Instructions (Signed)
Your physician has recommended you make the following change in your medication: [place] recommends that you start a low dose aspirin daily.  Your physician recommends that you return for lab work fasting.   Your physician recommends that you schedule a follow-up appointment in: 3-4 months with Dr. Tresa EndoKelly.

## 2014-09-03 ENCOUNTER — Telehealth: Payer: Self-pay | Admitting: *Deleted

## 2014-09-03 ENCOUNTER — Other Ambulatory Visit: Payer: Self-pay | Admitting: *Deleted

## 2014-09-03 DIAGNOSIS — R945 Abnormal results of liver function studies: Secondary | ICD-10-CM

## 2014-09-03 DIAGNOSIS — E785 Hyperlipidemia, unspecified: Secondary | ICD-10-CM

## 2014-09-03 DIAGNOSIS — Z79899 Other long term (current) drug therapy: Secondary | ICD-10-CM

## 2014-09-03 DIAGNOSIS — R7989 Other specified abnormal findings of blood chemistry: Secondary | ICD-10-CM

## 2014-09-03 MED ORDER — NITROGLYCERIN 0.4 MG SL SUBL: 0.4000 mg | SUBLINGUAL_TABLET | SUBLINGUAL | Status: DC | PRN

## 2014-09-03 NOTE — Telephone Encounter (Signed)
-----   Message from Allayne ButcherBrittainy M Simmons, [place] sent at 08/31/2014  5:03 PM EST ----- LFTs improving. AST back to normal but ALT slightly elevated.  LDL is elevated at 113. She has known coronary disease. LDL goal is <70. Will defer initiation of statin to Dr. Tresa EndoKelly, given mild LFT elevation. May try diet and exercise and repeat in 6 weeks.

## 2014-09-03 NOTE — Telephone Encounter (Signed)
Called and notified patient of lab results and recommendations. She also requested a prescription for sublingual nitro. Stated that at her last appointment with Boyce MediciBrittany Simmons she told her that she wanted her to have this. Prescription was sent to [facility].

## 2014-09-05 ENCOUNTER — Encounter: Payer: Self-pay | Admitting: Cardiovascular Disease

## 2014-09-10 ENCOUNTER — Encounter: Payer: Self-pay | Admitting: Cardiovascular Disease

## 2014-09-25 ENCOUNTER — Telehealth: Payer: Self-pay | Admitting: *Deleted

## 2014-09-25 MED ORDER — ATORVASTATIN CALCIUM 20 MG PO TABS: 20.0000 mg | ORAL_TABLET | Freq: Every day | ORAL | Status: DC

## 2014-09-25 NOTE — Telephone Encounter (Signed)
Called and informed patient of lab results and recommendations. Atorvastatin prescription sent to [facility].

## 2014-09-25 NOTE — Telephone Encounter (Signed)
-----   Message from Lennette Biharihomas A Kelly, MD sent at 09/09/2014 12:51 PM EST ----- Mild LAD disease at cath. LDL 133; consider atorvastatin 20 mg to get LDL <70

## 2014-11-26 ENCOUNTER — Ambulatory Visit (INDEPENDENT_AMBULATORY_CARE_PROVIDER_SITE_OTHER): Payer: Medicare Other | Admitting: Cardiovascular Disease

## 2014-11-26 ENCOUNTER — Encounter: Payer: Self-pay | Admitting: Cardiovascular Disease

## 2014-11-26 VITALS — BP 142/66 | HR 52 | Ht 61.0 in | Wt 139.8 lb

## 2014-11-26 DIAGNOSIS — I10 Essential (primary) hypertension: Secondary | ICD-10-CM | POA: Diagnosis not present

## 2014-11-26 DIAGNOSIS — E785 Hyperlipidemia, unspecified: Secondary | ICD-10-CM

## 2014-11-26 DIAGNOSIS — R0602 Shortness of breath: Secondary | ICD-10-CM

## 2014-11-26 DIAGNOSIS — I2510 Atherosclerotic heart disease of native coronary artery without angina pectoris: Secondary | ICD-10-CM | POA: Diagnosis not present

## 2014-11-26 MED ORDER — ATORVASTATIN CALCIUM 40 MG PO TABS: 40.0000 mg | ORAL_TABLET | Freq: Every day | ORAL | Status: DC

## 2014-11-26 NOTE — Patient Instructions (Signed)
Your physician has recommended you make the following change in your medication: increase the atorvastatin to 40 mg daily.  Cut the bisoprolol-hydrochlorothiazide into 1/2 tablet daily.  Your physician recommends that you return for lab work in: 3 months.  Your physician wants you to follow-up in: 6 months or sooner if needed. You will receive a reminder letter in the mail two months in advance. If you don't receive a letter, please call our office to schedule the follow-up appointment.

## 2014-11-27 ENCOUNTER — Encounter: Payer: Self-pay | Admitting: Cardiovascular Disease

## 2014-11-27 DIAGNOSIS — E785 Hyperlipidemia, unspecified: Secondary | ICD-10-CM | POA: Insufficient documentation

## 2014-11-27 DIAGNOSIS — I2510 Atherosclerotic heart disease of native coronary artery without angina pectoris: Secondary | ICD-10-CM | POA: Insufficient documentation

## 2014-11-27 NOTE — Progress Notes (Signed)
Patient ID: [name], female   DOB: [birth year], 64 y.o.   MRN: [id number]    HPI:  [name] is a 64 y.o. female who is followed by Dr. Tanda Rockers in [place], [place].  She was referred for [facility] cardiology evaluation in December 2015.   Her relative, [name], is one of my patients.  She presents to the office today in follow-up of her cardiac catheterization.   [name] has a history of hypertension dating back for at least 2002, and  recently  has been maintained on lisinopril 10 mg and Ziac 5/6.25 mg daily.  She has experienced episodic chest pressure intermittently and had initially undergone a stress test a  7 years ago.  She also has a history of depression. 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  Recently, she has noticed increasing palpitations and also has developed episodes of chest tightness which seems to be occurring with less walking.  She underwent a nuclear perfusion study in July 2015 which was done as a Fre[employer] stress.  There were no symptoms during the study.  She was felt to have a small fixed anteroseptal defect without ischemia.  Ejection fraction was 39%, but was felt that this may not be accurate due to many PVCs during the study.  She subsequently underwent a 2-D echo Doppler study in October 2015 in [place] , which demonstrated an ejection fraction at 55-60%.  There was mild left ventricular posterior wall thickness.  Check equivocal tissue Doppler assessment.  The peak pressure gradient across her aortic valve was 7.6 mm with a mean pressure gradient of 4.3 mm without evidence for significant aortic stenosis.  The patient states that recently she has noticed more chest tightness and this seems to occur with only walking halfway down her driveway.    1.  I initially saw her, I was concerned about her symptoms.  I added amlodipine 5 mg to help both with blood pressure control as well as potential anti-ischemic benefit.  Her  ECG changes in symptomatology.  Definitive cardiac catheterization was recommended.  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  Otherwise her coronary anatomy was normal.  Showed normal LV function without wall motion abnormalities and evidence for very mild intragraft mitral valve prolapse without significant mitral regurgitation.  She was started on statin therapy with her LDL of 113 attempt to induce plaque stability and regression.  She initially saw [name] for post hospital follow-up on 08/24/2014.  At that time, her blood pressure was well controlled at 110/70.  Presently, she feels well.  She denies any recurrent chest pain.  She does note less shortness of breath.  She has been maintained on amlodipine 5 mg Ziac 5/6.25 mg, lisinopril 10 mg daily.  She's now on atorvastatin 20 mg daily.  Recent blood work was obtained on 11/08/2014 which showed a total cholesterol 236, LDL 163, triglycerides 124.  She presents for evaluation.  Past medical history is notable for hypertension, palpitations, depressive disorder, osteoarthritis.  Past surgical history is notable for partial thyroidectomy while she was in eighth grade.   Current Outpatient Prescriptions  Medication Sig Dispense Refill  . alendronate (FOSAMAX) 70 MG tablet Take 70 mg by mouth once a week. Take with a full glass of water on an empty stomach.    [name] amLODipine (NORVASC) 5 MG tablet Take 1  tablet (5 mg total) by mouth daily. 90 tablet 3  . Ascorbic Acid (VITAMIN C) 1000 MG tablet Take 1,000 mg by mouth daily.    [name]. aspirin 81 MG tablet Take 81 mg by mouth daily.    [name]. BIOTIN 5000 PO Take by mouth.    . bisoprolol-hydrochlorothiazide (ZIAC) 5-6.25 MG per tablet Take 0.5 tablets by mouth every Kaiser.    . Calcium Carbonate (CALCIUM 600 PO) Take by  mouth.    . Cholecalciferol (VITAMIN D-3 PO) Take by mouth.    . diclofenac (VOLTAREN) 50 MG EC tablet Take 50 mg by mouth 2 (two) times daily.     [name]. lisinopril (PRINIVIL,ZESTRIL) 10 MG tablet Take 10 mg by mouth every Kaiser.     . nitroGLYCERIN (NITROSTAT) 0.4 MG SL tablet Place 1 tablet (0.4 mg total) under the tongue every 5 (five) minutes as needed for chest pain. 25 tablet 3  . Omega-3 Fatty Acids (FISH OIL PO) Take by mouth daily.    . TURMERIC CURCUMIN PO Take by mouth daily.    [name]. atorvastatin (LIPITOR) 40 MG tablet Take 1 tablet (40 mg total) by mouth daily. 90 tablet 3  . traMADol (ULTRAM) 50 MG tablet Take 1 tablet by mouth daily as needed.     No current facility-administered medications for this visit.   Social history is notable in that she is married for 39 years.  She does not have any children.  She lives with her husband.  She completed 12th grade of education.  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  There is no tobacco history or alcohol use.  She does care for many dogs.  Family History  Problem Relation Age of Onset  . Heart failure Mother    Family history is notable that her mother died at age 66 with possible congestive heart failure.  Her father died at 1078 with sudden death.  He had COPD.  A brother died at 2357 with cancer and a sister died at 3956 with cancer.   ROS General: Negative; No fevers, chills, or night sweats HEENT: Negative; No changes in vision or hearing, sinus congestion, difficulty swallowing Pulmonary: Negative; No cough, wheezing, shortness of breath, hemoptysis Cardiovascular:  See HPI;  GI: Negative; No nausea, vomiting, diarrhea, or abdominal pain GU: Negative; No dysuria, hematuria, or difficulty voiding Musculoskeletal: Positive for arthritis in her right knee Hematologic/Oncologic: Negative; no easy bruising, bleeding Endocrine: History of partial thyroidectomy Neuro: Negative; no changes in  balance, headaches Skin: Negative; No rashes or skin lesions Psychiatric: Prior history of depression, currently stable Sleep: Negative; No daytime sleepiness, hypersomnolence, bruxism, restless legs, hypnogagnic hallucinations Other comprehensive 14 point system review is negative   Physical Exam BP 142/66 mmHg  Pulse 52  Ht 5\' 1"  (1.549 m)  Wt 139 lb 12.8 oz (63.413 kg)  BMI 26.43 kg/m2  General: Alert, oriented, no distress.  Skin: normal turgor, no rashes, warm and dry HEENT: Normocephalic, atraumatic. Pupils equal round and reactive to light; sclera anicteric; extraocular muscles intact; Fundi mild arteriolar narrowing without hemorrhages or exudates Nose without nasal septal hypertrophy Mouth/Parynx benign; Mallinpatti scale 3 Neck: No JVD, no carotid bruits; normal carotid upstroke Lungs: clear to ausculatation and percussion; no wheezing or rales Chest wall: without tenderness to palpitation Heart: PMI not displaced, RRR, s1 s2 normal, 1/6 systolic murmur, no diastolic murmur, no rubs, gallops, thrills, or heaves Abdomen: soft, nontender; no hepatosplenomehaly, BS+; abdominal aorta nontender and  not dilated by palpation. Back: no CVA tenderness Pulses 2+ Musculoskeletal: full range of motion, normal strength, no joint deformities Extremities: no clubbing cyanosis or edema, Homan's sign negative  Neurologic: grossly nonfocal; Cranial nerves grossly wnl Psychologic: Normal mood and affect  ECG (independently read by me): Sinus bradycardia 52 bpm.  ST changes improved.  QTc interval 381 ms.   07/26/2014 ECG (independently read by me): Normal sinus rhythm at 68 bpm.  There is mild downsloping ST segment depression in leads II, III, and F V4 through V6, which is slightly more progressive since the patient's prior ECG done in [place].  LABS:  BMET    Component Value Date/Time   NA 138 07/26/2014 1553   K 4.2 07/26/2014 1553   CL 103 07/26/2014 1553   CO2 27  07/26/2014 1553   GLUCOSE 97 07/26/2014 1553   BUN 23 07/26/2014 1553   CREATININE 1.14* 07/26/2014 1553   CALCIUM 9.4 07/26/2014 1553     Hepatic Function Panel     Component Value Date/Time   PROT 6.8 08/24/2014 1202   ALBUMIN 4.2 08/24/2014 1202   AST 36 08/24/2014 1202   ALT 49* 08/24/2014 1202   ALKPHOS 64 08/24/2014 1202   BILITOT 0.6 08/24/2014 1202   BILIDIR 0.1 08/24/2014 1202   IBILI 0.5 08/24/2014 1202     CBC    Component Value Date/Time   WBC 11.9* 07/26/2014 1553   RBC 4.46 07/26/2014 1553   HGB 13.7 07/26/2014 1553   HCT 39.1 07/26/2014 1553   PLT 292 07/26/2014 1553   MCV 87.7 07/26/2014 1553   MCH 30.7 07/26/2014 1553   MCHC 35.0 07/26/2014 1553   RDW 13.2 07/26/2014 1553     BNP No results found for: PROBNP  Lipid Panel     Component Value Date/Time   CHOL 187 08/24/2014 1202   TRIG 148 08/24/2014 1202   HDL 44 08/24/2014 1202   CHOLHDL 4.3 08/24/2014 1202   VLDL 30 08/24/2014 1202   LDLCALC 113* 08/24/2014 1202      RADIOLOGY: No results found.   ASSESSMENT AND PLAN:  Ms.Stebbins is a 64 year old female with a 13 year history of hypertension and previously was on lisinopril 10 mg and bisoprolol/HCTZ 5/6.25 mg.  Her blood pressure is improved today with the addition of her amlodipine which was started at her initial office visit.  She also has experienced progressive chest tightness.  A prior nuclear perfusion study in July, raised the possibility of a fixed defect anteroseptal lead is felt that this was possibly due to left bundle branch block.  However, I reviewed the ECGs in the patient did not have left bundle branch block at the time of the stress test.  She did have mild RV conduction delay.  Her echo Doppler study has shown normal systolic function.  I again reviewed her cardiac catheterization findings.  This revealed a smooth 40-50% LAD stenosis which did not completely normalize following intracoronary nitroglycerin, and  otherwise normal coronary arteries.  She's not had any further episodes of chest pain on her current medical therapy.  She had recent lab work done in [place].  Her LDL cholesterol is elevated and she had not been taking the atorvastatin.  I have suggested she take 40 mg with a goal to have an LDL cholesterol less than 70 and suggested coenzyme Q 10, which may help with tolerability.  In 3 months follow-up laboratory including CMET, and lipid panel will be obtained.  She is bradycardic with  a pulse of 52, and has no signs of edema.  I have recommended she decrease her Ziac to one half pill per day.  Lennette Bihari, MD, [facility] 11/27/2014 5:55 PM

## 2015-02-14 LAB — COMPREHENSIVE METABOLIC PANEL
ALT: 30 U/L (ref 0–35)
AST: 27 U/L (ref 0–37)
Albumin: 3.9 g/dL (ref 3.5–5.2)
Alkaline Phosphatase: 76 U/L (ref 39–117)
BUN: 32 mg/dL — ABNORMAL HIGH (ref 6–23)
CO2: 24 mEq/L (ref 19–32)
Calcium: 9.2 mg/dL (ref 8.4–10.5)
Chloride: 105 mEq/L (ref 96–112)
Creat: 1.46 mg/dL — ABNORMAL HIGH (ref 0.50–1.10)
GLUCOSE: 107 mg/dL — AB (ref 70–99)
POTASSIUM: 4.6 meq/L (ref 3.5–5.3)
Sodium: 140 mEq/L (ref 135–145)
TOTAL PROTEIN: 6.3 g/dL (ref 6.0–8.3)
Total Bilirubin: 0.4 mg/dL (ref 0.2–1.2)

## 2015-02-14 LAB — LIPID PANEL
Cholesterol: 126 mg/dL (ref 0–200)
HDL: 32 mg/dL — ABNORMAL LOW (ref >=46)
LDL Cholesterol: 72 mg/dL (ref 0–99)
Total CHOL/HDL Ratio: 3.9 Ratio
Triglycerides: 108 mg/dL (ref <150)
VLDL: 22 mg/dL (ref 0–40)

## 2015-02-18 ENCOUNTER — Telehealth: Payer: Self-pay | Admitting: *Deleted

## 2015-02-18 DIAGNOSIS — Z79899 Other long term (current) drug therapy: Secondary | ICD-10-CM

## 2015-02-18 MED ORDER — LISINOPRIL 5 MG PO TABS: 5.0000 mg | ORAL_TABLET | Freq: Every day | ORAL | Status: DC

## 2015-02-18 NOTE — Telephone Encounter (Signed)
Patient notified of lab results. Instructed to decrease Lisinopril to  daily. BMET to be rechecked in 4 weeks.  Order mailed to patient

## 2015-02-18 NOTE — Telephone Encounter (Signed)
-----   Message from Lennette Bihari, MD sent at 02/17/2015 12:49 PM EDT ----- Cr inc to 1.46; reduce lisinopril to 5 mg; re check bmet in 4 weeks.

## 2015-05-30 ENCOUNTER — Ambulatory Visit (INDEPENDENT_AMBULATORY_CARE_PROVIDER_SITE_OTHER): Payer: Medicare Other | Admitting: Cardiovascular Disease

## 2015-05-30 VITALS — BP 122/60 | HR 57 | Ht 61.5 in | Wt 144.9 lb

## 2015-05-30 DIAGNOSIS — E890 Postprocedural hypothyroidism: Secondary | ICD-10-CM

## 2015-05-30 DIAGNOSIS — I10 Essential (primary) hypertension: Secondary | ICD-10-CM | POA: Diagnosis not present

## 2015-05-30 DIAGNOSIS — R079 Chest pain, unspecified: Secondary | ICD-10-CM

## 2015-05-30 DIAGNOSIS — E785 Hyperlipidemia, unspecified: Secondary | ICD-10-CM

## 2015-05-30 DIAGNOSIS — I2510 Atherosclerotic heart disease of native coronary artery without angina pectoris: Secondary | ICD-10-CM

## 2015-05-30 DIAGNOSIS — Z79899 Other long term (current) drug therapy: Secondary | ICD-10-CM | POA: Diagnosis not present

## 2015-05-30 DIAGNOSIS — Z8659 Personal history of other mental and behavioral disorders: Secondary | ICD-10-CM

## 2015-05-30 DIAGNOSIS — Z9889 Other specified postprocedural states: Secondary | ICD-10-CM

## 2015-05-30 DIAGNOSIS — N289 Disorder of kidney and ureter, unspecified: Secondary | ICD-10-CM

## 2015-05-30 DIAGNOSIS — Z9009 Acquired absence of other part of head and neck: Secondary | ICD-10-CM

## 2015-05-30 LAB — COMPREHENSIVE METABOLIC PANEL
ALBUMIN: 4.3 g/dL (ref 3.6–5.1)
ALT: 32 U/L — ABNORMAL HIGH (ref 6–29)
AST: 30 U/L (ref 10–35)
Alkaline Phosphatase: 89 U/L (ref 33–130)
BILIRUBIN TOTAL: 0.5 mg/dL (ref 0.2–1.2)
BUN: 26 mg/dL — ABNORMAL HIGH (ref 7–25)
CO2: 25 mmol/L (ref 20–31)
CREATININE: 1.38 mg/dL — AB (ref 0.50–0.99)
Calcium: 9.4 mg/dL (ref 8.6–10.4)
Chloride: 105 mmol/L (ref 98–110)
Glucose, Bld: 91 mg/dL (ref 65–99)
Potassium: 4.5 mmol/L (ref 3.5–5.3)
SODIUM: 138 mmol/L (ref 135–146)
TOTAL PROTEIN: 6.7 g/dL (ref 6.1–8.1)

## 2015-05-30 LAB — TSH: TSH: 1.471 u[IU]/mL (ref 0.350–4.500)

## 2015-05-30 NOTE — Patient Instructions (Signed)
Your physician recommends that you return for lab work.  Your physician wants you to follow-up in: 6 MONTHS or sooner if needed. You will receive a reminder letter in the mail two months in advance. If you don't receive a letter, please call our office to schedule the follow-up appointment.  If you need a refill on your cardiac medications before your next appointment, please call your pharmacy.

## 2015-06-01 ENCOUNTER — Encounter: Payer: Self-pay | Admitting: Cardiovascular Disease

## 2015-06-01 DIAGNOSIS — N289 Disorder of kidney and ureter, unspecified: Secondary | ICD-10-CM | POA: Insufficient documentation

## 2015-06-01 NOTE — Progress Notes (Signed)
Patient ID: [name], female   DOB: Mar 29, 1951, 64 y.o.   MRN: [id number]    HPI:  [name] is a 64 y.o. female who is followed by Dr. Ginette Otto in [place], [place].  Her relative, [name], is one of my patients.  She presents to the office today for six-month follow-up cardiology evaluation.   [name] has a history of hypertension dating back for at least 2002, and  recently  has been maintained on lisinopril 10 mg and Ziac 5/6.25 mg daily.  She has experienced episodic chest pressure intermittently and had initially undergone a stress test a  7 years ago.  She also has a history of depression. 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 She had noticed increasing palpitations and also  developed episodes of chest tightness last year. She underwent a nuclear perfusion study in July 2015 She was felt to have a small fixed anteroseptal defect without ischemia.  Ejection fraction was 39%, but was felt that this may not be accurate due to many PVCs during the study.  She subsequently underwent a 2-D echo Doppler study in October 2015 in [place] , which demonstrated an ejection fraction at 55-60%.  There was mild left ventricular posterior wall thickness.  Check equivocal tissue Doppler assessment.  The peak pressure gradient across her aortic valve was 7.6 mm with a mean pressure gradient of 4.3 mm without evidence for significant aortic stenosis.  The patient states that recently she has noticed more chest tightness and this seems to occur with only walking halfway down her driveway.    When I initially saw her,  I added amlodipine 5 mg to help both with blood pressure control as well as potential anti-ischemic benefit.   Due to recurrent symptoms, she underwent a catheterization on 08/03/2014 which revealed smooth 40-50% proximal LAD stenosis after the takeoff of the first diagonal vessel which did not significant normalize following intracoronary nitroglycerin  administration.  Otherwise her coronary anatomy was normal.  She had normal LV function without wall motion abnormalities and evidence for very mild  mitral valve prolapse without significant mitral regurgitation.  She was started on statin therapy with her LDL of 113 attempt to induce plaque stability and regression.  Since I last saw her, she has felt well.  There is been complete resolution of her previous chest tightness.  She denies any palpitations.She denies any recurrent chest pain.  She does note less shortness of breath.  She has been maintained on amlodipine 5 mg Ziac 5/6.25 mg, lisinopril 10 mg daily.  She is  now on an increased atorvastatin dose at 40 mg daily.  Recent blood work was obtained on 11/08/2014 which showed a total cholesterol 236, LDL 163, triglycerides 124.    Past medical history is notable for hypertension, palpitations, depressive disorder, osteoarthritis.  Past surgical history is notable for partial thyroidectomy while she was in eighth grade.   Current Outpatient Prescriptions  Medication Sig Dispense Refill  . amLODipine (NORVASC) 5 MG tablet Take 1 tablet (5 mg total) by mouth daily. 90 tablet 3  . Ascorbic Acid (VITAMIN C) 1000 MG tablet Take 1,000 mg by mouth daily.    [name] aspirin 81 MG tablet Take 81 mg by mouth daily.    [name] atorvastatin (LIPITOR) 40 MG tablet Take 1 tablet (40 mg total) by mouth daily. 90 tablet 3  . BIOTIN 5000 PO Take by mouth.    . bisoprolol-hydrochlorothiazide (ZIAC) 5-6.25 MG per tablet Take 0.5 tablets by mouth  every morning.    . Calcium Carbonate (CALCIUM 600 PO) Take by mouth.    . Cholecalciferol (VITAMIN D-3 PO) Take by mouth.    . diclofenac (VOLTAREN) 50 MG EC tablet Take 50 mg by mouth 2 (two) times daily.     [name] lisinopril (PRINIVIL,ZESTRIL) 5 MG tablet Take 1 tablet (5 mg total) by mouth daily. 90 tablet 3  . nitroGLYCERIN (NITROSTAT) 0.4 MG SL tablet Place 1 tablet (0.4 mg total) under the tongue every 5 (five) minutes as  needed for chest pain. 25 tablet 3  . Omega-3 Fatty Acids (FISH OIL PO) Take by mouth daily.    [name] tiZANidine (ZANAFLEX) 4 MG tablet Take 1 tablet by mouth every 6 (six) hours as needed.    . TURMERIC CURCUMIN PO Take by mouth daily.    . Zinc 50 MG CAPS Take 1 capsule by mouth 2 (two) times daily.     No current facility-administered medications for this visit.   Social history is notable in that she is married for 39 years.  She does not have any children.  She lives with her husband.  She completed 12th grade of education.  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  There is no tobacco history or alcohol use.  She does care for many dogs.  Family History  Problem Relation Age of Onset  . Heart failure Mother    Family history is notable that her mother died at age 31 with possible congestive heart failure.  Her father died at 19 with sudden death.  He had COPD.  A brother died at 6 with cancer and a sister died at 38 with cancer.   ROS General: Negative; No fevers, chills, or night sweats HEENT: Negative; No changes in vision or hearing, sinus congestion, difficulty swallowing Pulmonary: Negative; No cough, wheezing, shortness of breath, hemoptysis Cardiovascular:  See HPI;  GI: Negative; No nausea, vomiting, diarrhea, or abdominal pain GU: Negative; No dysuria, hematuria, or difficulty voiding Musculoskeletal: Positive for arthritis in her right knee Hematologic/Oncologic: Negative; no easy bruising, bleeding Endocrine: History of partial thyroidectomy Neuro: Negative; no changes in balance, headaches Skin: Negative; No rashes or skin lesions Psychiatric: Prior history of depression, currently stable Sleep: Negative; No daytime sleepiness, hypersomnolence, bruxism, restless legs, hypnogagnic hallucinations Other comprehensive 14 point system review is negative   Physical Exam BP 122/60 mmHg  Pulse 57  Ht 5' 1.5" (1.562 m)  Wt 144 lb  14.4 oz (65.726 kg)  BMI 26.94 kg/m2   Wt Readings from Last 3 Encounters:  05/30/15 144 lb 14.4 oz (65.726 kg)  11/26/14 139 lb 12.8 oz (63.413 kg)  08/24/14 146 lb 12.8 oz (66.588 kg)   General: Alert, oriented, no distress.  Skin: normal turgor, no rashes, warm and dry HEENT: Normocephalic, atraumatic. Pupils equal round and reactive to light; sclera anicteric; extraocular muscles intact; Fundi mild arteriolar narrowing without hemorrhages or exudates Nose without nasal septal hypertrophy Mouth/Parynx benign; Mallinpatti scale 3 Neck: No JVD, no carotid bruits; normal carotid upstroke Lungs: clear to ausculatation and percussion; no wheezing or rales Chest wall: without tenderness to palpitation Heart: PMI not displaced, RRR, s1 s2 normal, 1/6 systolic murmur, no diastolic murmur, no rubs, gallops, thrills, or heaves Abdomen: soft, nontender; no hepatosplenomehaly, BS+; abdominal aorta nontender and not dilated by palpation. Back: no CVA tenderness Pulses 2+ Musculoskeletal: full range of motion, normal strength, no joint deformities Extremities: no clubbing cyanosis or edema, Homan's sign  negative  Neurologic: grossly nonfocal; Cranial nerves grossly wnl Psychologic: Normal mood and affect  ECG (independently read by me): sinus bradycardia 57.  Normal intervals.  Nondiagnostic T changes in lead 27 Nov 2014 ECG (independently read by me): Sinus bradycardia 52 bpm.  ST changes improved.  QTc interval 381 ms.  07/26/2014 ECG (independently read by me): Normal sinus rhythm at 68 bpm.  There is mild downsloping ST segment depression in leads II, III, and F V4 through V6, which is slightly more progressive since the patient's prior ECG done in [place].  LABS: BMP Latest Ref Rng 05/30/2015 02/14/2015 07/26/2014  Glucose 65 - 99 mg/dL 91 107(H) 97  BUN 7 - 25 mg/dL 26(H) 32(H) 23  Creatinine 0.50 - 0.99 mg/dL 1.38(H) 1.46(H) 1.14(H)  Sodium 135 - 146 mmol/L 138 140 138  Potassium  3.5 - 5.3 mmol/L 4.5 4.6 4.2  Chloride 98 - 110 mmol/L 105 105 103  CO2 20 - 31 mmol/L $RemoveB'25 24 27  'anurmrfy$ Calcium 8.6 - 10.4 mg/dL 9.4 9.2 9.4   Hepatic Function Latest Ref Rng 05/30/2015 02/14/2015 08/24/2014  Total Protein 6.1 - 8.1 g/dL 6.7 6.3 6.8  Albumin 3.6 - 5.1 g/dL 4.3 3.9 4.2  AST 10 - 35 U/L 30 27 36  ALT 6 - 29 U/L 32(H) 30 49(H)  Alk Phosphatase 33 - 130 U/L 89 76 64  Total Bilirubin 0.2 - 1.2 mg/dL 0.5 0.4 0.6  Bilirubin, Direct 0.0 - 0.3 mg/dL - - 0.1   CBC Latest Ref Rng 07/26/2014  WBC 4.0 - 10.5 K/uL 11.9(H)  Hemoglobin 12.0 - 15.0 g/dL 13.7  Hematocrit 36.0 - 46.0 % 39.1  Platelets 150 - 400 K/uL 292   Lab Results  Component Value Date   MCV 87.7 07/26/2014   Lab Results  Component Value Date   TSH 1.471 05/30/2015  No results found for: HGBA1C  Lipid Panel     Component Value Date/Time   CHOL 126 02/14/2015 0400   TRIG 108 02/14/2015 0400   HDL 32* 02/14/2015 0400   CHOLHDL 3.9 02/14/2015 0400   VLDL 22 02/14/2015 0400   LDLCALC 72 02/14/2015 0400     RADIOLOGY: No results found.   ASSESSMENT AND PLAN:  Ms.Kennard is a 64 year old female has a long-standing history of hypertension, which improved with the addition of amlodipine 5 mg added to her Ziac, lisinopril therapy.  Last year, she had experienced episodic chest tightness in cardiac catheterization performed after a nuclear perfusion study raised the possibility of anteroseptal defect  revealed a smooth 40-50% LAD stenosis which did not completely normalize following intracoronary nitroglycerin, and otherwise normal coronary arteries.  She's not had any further episodes of chest pain on her current medical therapy. Early this year, her lipid studies were still elevated and her dose of atorvastatin was increased.  Most recent laboratory in July shows her LDL cholesterol now at 72.  Several months ago lab revealed mild renal insufficiency with a Cr 1.46 and for this reason, her lisinopril was reduced from  10 mg to 5 mg.  I am recommending a follow-up chemistry profile to reassess her renal function.  I will also check a TSH, which has not been recently checked.  She is not having any palpitations.  She has gained approximate 5 pounds since May 2016.  Her BMI today is 26.9.  I will see her in 6 months for cardiology reevaluation.  Time spent: 25 minutes Troy Sine, MD, [facility] 06/01/2015 8:26 AM

## 2015-07-06 ENCOUNTER — Other Ambulatory Visit: Payer: Self-pay | Admitting: Cardiovascular Disease

## 2015-07-08 NOTE — Telephone Encounter (Signed)
Rx request sent to pharmacy.  

## 2015-11-16 ENCOUNTER — Other Ambulatory Visit: Payer: Self-pay | Admitting: Cardiovascular Disease

## 2015-11-18 NOTE — Telephone Encounter (Signed)
REFILL 

## 2016-01-09 ENCOUNTER — Other Ambulatory Visit: Payer: Self-pay | Admitting: Cardiovascular Disease

## 2016-01-09 NOTE — Telephone Encounter (Signed)
Rx(s) sent to pharmacy electronically.  

## 2016-02-07 ENCOUNTER — Encounter: Payer: Self-pay | Admitting: Cardiovascular Disease

## 2016-02-07 ENCOUNTER — Ambulatory Visit (INDEPENDENT_AMBULATORY_CARE_PROVIDER_SITE_OTHER): Payer: Medicare Other | Admitting: Cardiovascular Disease

## 2016-02-07 VITALS — BP 145/65 | HR 56 | Ht 62.0 in | Wt 144.0 lb

## 2016-02-07 DIAGNOSIS — I10 Essential (primary) hypertension: Secondary | ICD-10-CM | POA: Diagnosis not present

## 2016-02-07 DIAGNOSIS — I2510 Atherosclerotic heart disease of native coronary artery without angina pectoris: Secondary | ICD-10-CM

## 2016-02-07 NOTE — Patient Instructions (Signed)
Your physician recommends that you return for lab work today.  Your physician wants you to follow-up in: 1 year or sooner if needed. You will receive a reminder letter in the mail two months in advance. If you don't receive a letter, please call our office to schedule the follow-up appointment.  Your physician has recommended you make the following change in your medication: Dr Tresa Endokelly recommends that you use the diclofenac only as needed and not daily.

## 2016-02-08 LAB — COMPREHENSIVE METABOLIC PANEL
ALT: 15 U/L (ref 6–29)
AST: 19 U/L (ref 10–35)
Albumin: 4.1 g/dL (ref 3.6–5.1)
Alkaline Phosphatase: 72 U/L (ref 33–130)
BUN: 24 mg/dL (ref 7–25)
CO2: 25 mmol/L (ref 20–31)
CREATININE: 1.21 mg/dL — AB (ref 0.50–0.99)
Calcium: 10.3 mg/dL (ref 8.6–10.4)
Chloride: 105 mmol/L (ref 98–110)
Glucose, Bld: 123 mg/dL — ABNORMAL HIGH (ref 65–99)
Potassium: 4.7 mmol/L (ref 3.5–5.3)
SODIUM: 140 mmol/L (ref 135–146)
TOTAL PROTEIN: 6.8 g/dL (ref 6.1–8.1)
Total Bilirubin: 0.5 mg/dL (ref 0.2–1.2)

## 2016-02-09 ENCOUNTER — Encounter: Payer: Self-pay | Admitting: Cardiovascular Disease

## 2016-02-09 NOTE — Progress Notes (Signed)
Patient ID: [name], female   DOB: [birth year], 65 y.o.   MRN: [id number]    HPI:  [name] is a 65 y.o. female who is followed by Dr. Ginette Otto in [place], [place].  Her relative, [name], is one of my patients.  She presents to the office today for 9 month follow-up cardiology evaluation.   [name] has a history of hypertension dating back for at least 2002, and  recently  has been maintained on lisinopril 10 mg and Ziac 5/6.25 mg daily.  She has experienced episodic chest pressure intermittently and had initially undergone a stress test a  7 years ago.  She also has a history of depression. 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 She had noticed increasing palpitations and also  developed episodes of chest tightness last year. She underwent a nuclear perfusion study in July 2015 She was felt to have a small fixed anteroseptal defect without ischemia.  Ejection fraction was 39%, but was felt that this may not be accurate due to many PVCs during the study.  She subsequently underwent a 2-D echo Doppler study in October 2015 in [place] , which demonstrated an ejection fraction at 55-60%.  There was mild left ventricular posterior wall thickness.  Check equivocal tissue Doppler assessment.  The peak pressure gradient across her aortic valve was 7.6 mm with a mean pressure gradient of 4.3 mm without evidence for significant aortic stenosis.  The patient states that recently she has noticed more chest tightness and this seems to occur with only walking halfway down her driveway.    When I initially saw her,  I added amlodipine 5 mg to help both with blood pressure control as well as potential anti-ischemic benefit.   Due to recurrent symptoms, she underwent a catheterization on 08/03/2014 which revealed smooth 40-50% proximal LAD stenosis after the takeoff of the first diagonal vessel which did not significant normalize following intracoronary nitroglycerin  administration.  Otherwise her coronary anatomy was normal.  She had normal LV function without wall motion abnormalities and evidence for very mild  mitral valve prolapse without significant mitral regurgitation.  She was started on statin therapy with her LDL of 113 attempt to induce plaque stability and regression.  Since I last saw her in November 2016.  He has continued to feel well, she has  felt well.  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  She denies any recurrent episodes of chest pain or tightness.  No palpitations.  She denies presyncope or syncope.  She was told of having mild osteopenia on bone density imaging.  She underwent a CBC, and lipid study by her primary physician.  Her total cholesterol was 142, HDL 43, LDL 83, and triglycerides 76.  Hemoglobin and hematocrit were 13.2 and 39.1.  She denies any palpitations.She denies any recurrent chest pain.  She does note less shortness of breath.  She is on amlodipine 5 mg Ziac 5/6.25 mg, lisinopril 10 mg daily.  She is now on an increased atorvastatin dose at 40 mg daily.  Lab work from one year ago on 11/08/2014 showed a total cholesterol 236, LDL 163, triglycerides 124.  Her lipid studies have improved under increased atorvastatin dose.  She presents for evaluation.  Past medical history is notable for hypertension, palpitations, depressive disorder, osteoarthritis.  Past surgical history is notable for partial thyroidectomy while she was in eighth grade.   Current Outpatient Prescriptions  Medication Sig Dispense Refill  . amLODipine (NORVASC) 5 MG tablet  TAKE 1 TABLET EVERY DAY 90 tablet 2  . atorvastatin (LIPITOR) 40 MG tablet Take 1 tablet (40 mg total) by mouth daily. KEEP OV. 90 tablet 0  . BIOTIN 5000 PO Take by mouth.    . bisoprolol-hydrochlorothiazide (ZIAC) 5-6.25 MG per tablet Take 0.5 tablets by mouth every morning.    . Calcium Carbonate (CALCIUM 600 PO) Take by mouth.    . Cholecalciferol (VITAMIN D-3 PO) Take by  mouth.    . diclofenac (VOLTAREN) 50 MG EC tablet Take 50 mg by mouth as needed.    [name] lisinopril (PRINIVIL,ZESTRIL) 5 MG tablet Take 1 tablet (5 mg total) by mouth daily. 90 tablet 3  . nitroGLYCERIN (NITROSTAT) 0.4 MG SL tablet Place 1 tablet (0.4 mg total) under the tongue every 5 (five) minutes as needed for chest pain. 25 tablet 3  . Omega-3 Fatty Acids (FISH OIL PO) Take by mouth daily.    . TURMERIC CURCUMIN PO Take by mouth daily.     No current facility-administered medications for this visit.   Social history is notable in that she is married for 39 years.  She does not have any children.  She lives with her husband.  She completed 12th grade of education.  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  There is no tobacco history or alcohol use.  She does care for many dogs.  Family History  Problem Relation Age of Onset  . Heart failure Mother   . Sudden death Father   . COPD Father   . Cancer Brother   . Cancer Sister    Family history is notable that her mother died at age 54 with possible congestive heart failure.  Her father died at 79 with sudden death.  He had COPD.  A brother died at 63 with cancer and a sister died at 51 with cancer.   ROS General: Negative; No fevers, chills, or night sweats HEENT: Negative; No changes in vision or hearing, sinus congestion, difficulty swallowing Pulmonary: Negative; No cough, wheezing, shortness of breath, hemoptysis Cardiovascular:  See HPI;  GI: Negative; No nausea, vomiting, diarrhea, or abdominal pain GU: Negative; No dysuria, hematuria, or difficulty voiding Musculoskeletal: Positive for arthritis in her right knee Hematologic/Oncologic: Negative; no easy bruising, bleeding Endocrine: History of partial thyroidectomy Neuro: Negative; no changes in balance, headaches Skin: Negative; No rashes or skin lesions Psychiatric: Prior history of depression, currently stable Sleep: Negative; No  daytime sleepiness, hypersomnolence, bruxism, restless legs, hypnogagnic hallucinations Other comprehensive 14 point system review is negative   Physical Exam BP 145/65 mmHg  Pulse 56  Ht '5\' 2"'$  (1.575 m)  Wt 144 lb (65.318 kg)  BMI 26.33 kg/m2   Wt Readings from Last 3 Encounters:  02/07/16 144 lb (65.318 kg)  05/30/15 144 lb 14.4 oz (65.726 kg)  11/26/14 139 lb 12.8 oz (63.413 kg)   General: Alert, oriented, no distress.  Skin: normal turgor, no rashes, warm and dry HEENT: Normocephalic, atraumatic. Pupils equal round and reactive to light; sclera anicteric; extraocular muscles intact; Fundi mild arteriolar narrowing without hemorrhages or exudates Nose without nasal septal hypertrophy Mouth/Parynx benign; Mallinpatti scale 3 Neck: No JVD, no carotid bruits; normal carotid upstroke Lungs: clear to ausculatation and percussion; no wheezing or rales Chest wall: without tenderness to palpitation Heart: PMI not displaced, RRR, s1 s2 normal, 1/6 systolic murmur, no diastolic murmur, no rubs, gallops, thrills, or heaves Abdomen: soft, nontender; no hepatosplenomehaly, BS+; abdominal aorta nontender  and not dilated by palpation. Back: no CVA tenderness Pulses 2+ Musculoskeletal: full range of motion, normal strength, no joint deformities Extremities: no clubbing cyanosis or edema, Homan's sign negative  Neurologic: grossly nonfocal; Cranial nerves grossly wnl Psychologic: Normal mood and affect   ECG (independently read by me): Sinus bradycardia 56 bpm.  Normal intervals.  Previously noted T-wave changes in leads 3 and aVF.  ECG (independently read by me): sinus bradycardia 57.  Normal intervals.  Nondiagnostic T changes in lead 27 Nov 2014 ECG (independently read by me): Sinus bradycardia 52 bpm.  ST changes improved.  QTc interval 381 ms.  07/26/2014 ECG (independently read by me): Normal sinus rhythm at 68 bpm.  There is mild downsloping ST segment depression in leads II, III,  and F V4 through V6, which is slightly more progressive since the patient's prior ECG done in [place].  LABS: Laboratory from 11/11/2015 including a lipid panel and CBC were reviewed.  BMP Latest Ref Rng 02/07/2016 05/30/2015 02/14/2015  Glucose 65 - 99 mg/dL 123(H) 91 107(H)  BUN 7 - 25 mg/dL 24 26(H) 32(H)  Creatinine 0.50 - 0.99 mg/dL 1.21(H) 1.38(H) 1.46(H)  Sodium 135 - 146 mmol/L 140 138 140  Potassium 3.5 - 5.3 mmol/L 4.7 4.5 4.6  Chloride 98 - 110 mmol/L 105 105 105  CO2 20 - 31 mmol/L '25 25 24  '$ Calcium 8.6 - 10.4 mg/dL 10.3 9.4 9.2   Hepatic Function Latest Ref Rng 02/07/2016 05/30/2015 02/14/2015  Total Protein 6.1 - 8.1 g/dL 6.8 6.7 6.3  Albumin 3.6 - 5.1 g/dL 4.1 4.3 3.9  AST 10 - 35 U/L '19 30 27  '$ ALT 6 - 29 U/L 15 32(H) 30  Alk Phosphatase 33 - 130 U/L 72 89 76  Total Bilirubin 0.2 - 1.2 mg/dL 0.5 0.5 0.4  Bilirubin, Direct 0.0 - 0.3 mg/dL - - -   CBC Latest Ref Rng 07/26/2014  WBC 4.0 - 10.5 K/uL 11.9(H)  Hemoglobin 12.0 - 15.0 g/dL 13.7  Hematocrit 36.0 - 46.0 % 39.1  Platelets 150 - 400 K/uL 292   Lab Results  Component Value Date   MCV 87.7 07/26/2014   Lab Results  Component Value Date   TSH 1.471 05/30/2015  No results found for: HGBA1C  Lipid Panel     Component Value Date/Time   CHOL 126 02/14/2015 0400   TRIG 108 02/14/2015 0400   HDL 32* 02/14/2015 0400   CHOLHDL 3.9 02/14/2015 0400   VLDL 22 02/14/2015 0400   LDLCALC 72 02/14/2015 0400     RADIOLOGY: No results found.   ASSESSMENT AND PLAN:  Ms.Hora is a 65 year old female who has a long-standing history of hypertension, which improved with the addition of amlodipine 5 mg added to her Ziac and lisinopril therapy.  In 2015 she had experienced episodic chest tightness.  Cardiac catheterization performed after a nuclear perfusion study raised the possibility of anteroseptal defect  revealed a smooth 40-50% LAD stenosis which did not completely normalize following intracoronary  nitroglycerin, and otherwise normal coronary arteries.  She has remained stable without recurrent episodes of chest pain on her current medical therapy.  Last year, her lipid studies were still elevated and her dose of atorvastatin was increased.  Most recent laboratory shows improvement in her lipid studies, although LDL was 83, which was slightly increased from one year ago when it had dropped to 72.  She does have mild renal insufficiency.  Last year.  I reduced her lisinopril dose from 10 mg  to 5 mg due to this will insufficiency.  I checked a comprehensive metabolic panel today to further evaluate her mild renal insufficiency.  One year ago.  Her creatinine had risen to 1.46.  Laboratory done at this office visit has shown improvement with a creatinine of 1.21.  Her weight is stable.  She was recently found to have mild osteopenia.  Her blood pressure today continues to be upper normal.  On her current regimen.  She willl monitor her blood pressure.  She will be having a follow-up evaluation with Dr. Ginette Otto.  I will see her in one year for follow-up cardiology evaluation.  Time spent: 25 minutes Troy Sine, MD, [facility] 02/09/2016 9:32 AM

## 2016-03-04 ENCOUNTER — Other Ambulatory Visit: Payer: Self-pay | Admitting: Cardiovascular Disease

## 2016-03-24 ENCOUNTER — Other Ambulatory Visit: Payer: Self-pay | Admitting: Cardiovascular Disease

## 2016-07-10 ENCOUNTER — Telehealth: Payer: Self-pay | Admitting: Cardiovascular Disease

## 2016-07-10 NOTE — Telephone Encounter (Signed)
New Message   Pt call requesting to speak with RN. Pt states she drop off forms for clearance. Pt call to f/u if the forms were received. Pt states she left the forms at check in. Please call back to discuss

## 2016-07-13 NOTE — Telephone Encounter (Signed)
Follow Up   [name] is calling to find out about her Cardiac Clearance. She has called a couple times about this clearance . Please call

## 2016-07-15 ENCOUNTER — Telehealth: Payer: Self-pay | Admitting: *Deleted

## 2016-07-15 NOTE — Telephone Encounter (Signed)
Requesting surgical clearance:   1. Type of surgery: Right knee: right TKA w/navigation, CAS Image-less  2. Surgeon: Samson FredericBrian Swinteck  3. Surgical date: PENDING CLEARANCE  4. Medications that need to be held: NONE                         Special instructions:

## 2016-07-17 NOTE — Telephone Encounter (Signed)
Clearance has been placed in Dr Landry DykeKelly's box for review and approval.

## 2016-07-28 NOTE — Telephone Encounter (Signed)
OK for surgery.

## 2016-08-24 ENCOUNTER — Ambulatory Visit (INDEPENDENT_AMBULATORY_CARE_PROVIDER_SITE_OTHER): Payer: Medicare Other | Admitting: Cardiovascular Disease

## 2016-08-24 ENCOUNTER — Encounter: Payer: Self-pay | Admitting: Cardiovascular Disease

## 2016-08-24 VITALS — BP 130/60 | HR 64 | Ht 62.0 in | Wt 147.0 lb

## 2016-08-24 DIAGNOSIS — I493 Ventricular premature depolarization: Secondary | ICD-10-CM

## 2016-08-24 DIAGNOSIS — I10 Essential (primary) hypertension: Secondary | ICD-10-CM

## 2016-08-24 DIAGNOSIS — Z8659 Personal history of other mental and behavioral disorders: Secondary | ICD-10-CM

## 2016-08-24 DIAGNOSIS — E785 Hyperlipidemia, unspecified: Secondary | ICD-10-CM

## 2016-08-24 DIAGNOSIS — I2510 Atherosclerotic heart disease of native coronary artery without angina pectoris: Secondary | ICD-10-CM

## 2016-08-24 DIAGNOSIS — Z01818 Encounter for other preprocedural examination: Secondary | ICD-10-CM

## 2016-08-24 MED ORDER — METOPROLOL SUCCINATE ER 25 MG PO TB24: 25.0000 mg | ORAL_TABLET | Freq: Every day | ORAL | 3 refills | Status: DC

## 2016-08-24 NOTE — Progress Notes (Signed)
Patient ID: [name], female   DOB: 07-27-1951, 66 y.o.   MRN: [id number]    HPI:  [name] is a 66 y.o. female who is followed by Dr. Ginette Otto in [place], [place].  Her relative, [name], is one of my patients.  She presents to the office today for 6 month follow-up cardiology evaluation.   [name] has a history of hypertension dating back for at least 2002, and  recently  has been maintained on lisinopril 10 mg and Ziac 5/6.25 mg daily.  She has experienced episodic chest pressure intermittently and had initially undergone a stress test a  7 years ago.  She also has a history of depression. 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 She had noticed increasing palpitations and also  developed episodes of chest tightness last year. She underwent a nuclear perfusion study in July 2015 She was felt to have a small fixed anteroseptal defect without ischemia.  Ejection fraction was 39%, but was felt that this may not be accurate due to many PVCs during the study.  She subsequently underwent a 2-D echo Doppler study in October 2015 in [place] , which demonstrated an ejection fraction at 55-60%.  There was mild left ventricular posterior wall thickness.  Check equivocal tissue Doppler assessment.  The peak pressure gradient across her aortic valve was 7.6 mm with a mean pressure gradient of 4.3 mm without evidence for significant aortic stenosis.  The patient states that recently she has noticed more chest tightness and this seems to occur with only walking halfway down her driveway.    When I initially saw her,  I added amlodipine 5 mg to help both with blood pressure control as well as potential anti-ischemic benefit.   Due to recurrent symptoms, she underwent a catheterization on 08/03/2014 which revealed smooth 40-50% proximal LAD stenosis after the takeoff of the first diagonal vessel which did not significant normalize following intracoronary nitroglycerin  administration.  Otherwise her coronary anatomy was normal.  She had normal LV function without wall motion abnormalities and evidence for very mild  mitral valve prolapse without significant mitral regurgitation.  She was started on statin therapy with her LDL of 113 attempt to induce plaque stability and regression.  When I last saw her, she was feeling well.  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  She denies any recurrent episodes of chest pain or tightness.  She denies presyncope or syncope.  She was told of having mild osteopenia on bone density imaging.  She underwent a CBC, and lipid study by her primary physician.  Her total cholesterol was 142, HDL 43, LDL 83, and triglycerides 76.  Hemoglobin and hematocrit were 13.2 and 39.1.  She denies any palpitations.She denies any recurrent chest pain.  She does note less shortness of breath.  She is on amlodipine 5 mg Ziac 5/6.25 mg, lisinopril 10 mg daily.  She is now on an increased atorvastatin dose at 40 mg daily.  Lab work from one year ago on 11/08/2014 showed a total cholesterol 236, LDL 163, triglycerides 124.  Her lipid studies have improved under increased atorvastatin dose.    She presents to the office today complaining that she has noticed an occasional pause when she takes her pulse.  She denies any sensation of tachycardia.  She denies presyncope.  She denies syncope.  There are no episodes of chest pain.  She is in need for right knee surgery to be done by Dr. Delfino Lovett.  She presents  to the office for evaluation.  Past medical history is notable for hypertension, palpitations, depressive disorder, osteoarthritis.  Past surgical history is notable for partial thyroidectomy while she was in eighth grade.   Current Outpatient Prescriptions  Medication Sig Dispense Refill  . amLODipine (NORVASC) 5 MG tablet TAKE 1 TABLET EVERY DAY 90 tablet 2  . atorvastatin (LIPITOR) 40 MG tablet TAKE 1 TABLET EVERY DAY (KEEP OFFICE VISIT) 90 tablet 3   . BIOTIN 5000 PO Take by mouth.    . Calcium Carbonate (CALCIUM 600 PO) Take by mouth.    . Cholecalciferol (VITAMIN D-3 PO) Take by mouth.    . diclofenac (VOLTAREN) 50 MG EC tablet Take 50 mg by mouth as needed.    [name] lisinopril (PRINIVIL,ZESTRIL) 10 MG tablet Take 10 mg by mouth daily.    . Magnesium 250 MG TABS Take 250 mg by mouth daily.    . nitroGLYCERIN (NITROSTAT) 0.4 MG SL tablet Place 1 tablet (0.4 mg total) under the tongue every 5 (five) minutes as needed for chest pain. 25 tablet 3  . Omega-3 Fatty Acids (FISH OIL PO) Take by mouth daily.    . TURMERIC CURCUMIN PO Take by mouth daily.    . metoprolol succinate (TOPROL XL) 25 MG 24 hr tablet Take 1 tablet (25 mg total) by mouth daily. 90 tablet 3   No current facility-administered medications for this visit.    Social history is notable in that she is married for 39 years.  She does not have any children.  She lives with her husband.  She completed 12th grade of education.  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  There is no tobacco history or alcohol use.  She does care for many dogs.  Family History  Problem Relation Age of Onset  . Heart failure Mother   . Sudden death Father   . COPD Father   . Cancer Brother   . Cancer Sister    Family history is notable that her mother died at age 20 with possible congestive heart failure.  Her father died at 37 with sudden death.  He had COPD.  A brother died at 51 with cancer and a sister died at 92 with cancer.   ROS General: Negative; No fevers, chills, or night sweats HEENT: Negative; No changes in vision or hearing, sinus congestion, difficulty swallowing Pulmonary: Negative; No cough, wheezing, shortness of breath, hemoptysis Cardiovascular:  See HPI;  GI: Negative; No nausea, vomiting, diarrhea, or abdominal pain GU: Negative; No dysuria, hematuria, or difficulty voiding Musculoskeletal: Positive for arthritis in her right  knee Hematologic/Oncologic: Negative; no easy bruising, bleeding Endocrine: History of partial thyroidectomy Neuro: Negative; no changes in balance, headaches Skin: Negative; No rashes or skin lesions Psychiatric: Prior history of depression, currently stable Sleep: Negative; No daytime sleepiness, hypersomnolence, bruxism, restless legs, hypnogagnic hallucinations Other comprehensive 14 point system review is negative   Physical Exam BP 130/60   Pulse 64   Ht _0  (1.575 m)   Wt 147 lb (66.7 kg)   BMI 26.89 kg/m    Repeat blood pressure by me was 130/68.  Pulse was in the 60s with the occasional PVC followed by compensatory pause.  Wt Readings from Last 3 Encounters:  08/24/16 147 lb (66.7 kg)  02/07/16 144 lb (65.3 kg)  05/30/15 144 lb 14.4 oz (65.7 kg)   General: Alert, oriented, no distress.  Skin: normal turgor, no rashes, warm and dry  HEENT: Normocephalic, atraumatic. Pupils equal round and reactive to light; sclera anicteric; extraocular muscles intact; Fundi mild arteriolar narrowing without hemorrhages or exudates Nose without nasal septal hypertrophy Mouth/Parynx benign; Mallinpatti scale 3 Neck: No JVD, no carotid bruits; normal carotid upstroke Lungs: clear to ausculatation and percussion; no wheezing or rales Chest wall: without tenderness to palpitation Heart: PMI not displaced, RRR, s1 s2 normal, 1/6 systolic murmur, no diastolic murmur, no rubs, gallops, thrills, or heaves Abdomen: soft, nontender; no hepatosplenomehaly, BS+; abdominal aorta nontender and not dilated by palpation. Back: no CVA tenderness Pulses 2+ Musculoskeletal: full range of motion, normal strength, no joint deformities Extremities: no clubbing cyanosis or edema, Homan's sign negative  Neurologic: grossly nonfocal; Cranial nerves grossly wnl Psychologic: Normal mood and affect  ECG (independently read by me): Sinus rhythm at 64 bpm.  Occasional isolated PVC followed by compensatory pause.   QTc interval 44 ms.  PR interval 128 ms.  No ST segment changes.  July 2017 ECG (independently read by me): Sinus bradycardia 56 bpm.  Normal intervals.  Previously noted T-wave changes in leads 3 and aVF.  ECG (independently read by me): sinus bradycardia 57.  Normal intervals.  Nondiagnostic T changes in lead 27 Nov 2014 ECG (independently read by me): Sinus bradycardia 52 bpm.  ST changes improved.  QTc interval 381 ms.  07/26/2014 ECG (independently read by me): Normal sinus rhythm at 68 bpm.  There is mild downsloping ST segment depression in leads II, III, and F V4 through V6, which is slightly more progressive since the patient's prior ECG done in [place].  LABS: Laboratory from 11/11/2015 including a lipid panel and CBC were reviewed.  BMP Latest Ref Rng & Units 02/07/2016 05/30/2015 02/14/2015  Glucose 65 - 99 mg/dL 123(H) 91 107(H)  BUN 7 - 25 mg/dL 24 26(H) 32(H)  Creatinine 0.50 - 0.99 mg/dL 1.21(H) 1.38(H) 1.46(H)  Sodium 135 - 146 mmol/L 140 138 140  Potassium 3.5 - 5.3 mmol/L 4.7 4.5 4.6  Chloride 98 - 110 mmol/L 105 105 105  CO2 20 - 31 mmol/L _0 Calcium 8.6 - 10.4 mg/dL 10.3 9.4 9.2   Hepatic Function Latest Ref Rng & Units 02/07/2016 05/30/2015 02/14/2015  Total Protein 6.1 - 8.1 g/dL 6.8 6.7 6.3  Albumin 3.6 - 5.1 g/dL 4.1 4.3 3.9  AST 10 - 35 U/L _1 ALT 6 - 29 U/L 15 32(H) 30  Alk Phosphatase 33 - 130 U/L 72 89 76  Total Bilirubin 0.2 - 1.2 mg/dL 0.5 0.5 0.4  Bilirubin, Direct 0.0 - 0.3 mg/dL - - -   CBC Latest Ref Rng & Units 07/26/2014  WBC 4.0 - 10.5 K/uL 11.9(H)  Hemoglobin 12.0 - 15.0 g/dL 13.7  Hematocrit 36.0 - 46.0 % 39.1  Platelets 150 - 400 K/uL 292   Lab Results  Component Value Date   MCV 87.7 07/26/2014   Lab Results  Component Value Date   TSH 1.471 05/30/2015  No results found for: HGBA1C  Lipid Panel     Component Value Date/Time   CHOL 126 02/14/2015 0400   TRIG 108 02/14/2015 0400   HDL 32 (L) 02/14/2015 0400    CHOLHDL 3.9 02/14/2015 0400   VLDL 22 02/14/2015 0400   LDLCALC 72 02/14/2015 0400     RADIOLOGY: No results found.  IMPRESSION:  1. CAD in native artery   2. Essential hypertension   3. Hyperlipidemia LDL goal <70   4. Unifocal PVCs   5.  History of depression   6. Preoperative clearance     ASSESSMENT AND PLAN:  Ms.Dorning is a 66 year old female who has a long-standing history of hypertension, which improved with the addition of amlodipine 5 mg added to her Ziac and lisinopril therapy.  In 2015 she had experienced episodic chest tightness.  Cardiac catheterization performed after a nuclear perfusion study raised the possibility of anteroseptal defect  revealed a smooth 40-50% LAD stenosis which did not completely normalize following intracoronary nitroglycerin, and otherwise normal coronary arteries.  She has remained stable without recurrent episodes of chest pain on her current medical therapy.  Last year, her lipid studies were still elevated and her dose of atorvastatin was increased.  With which improved with increased atorvastatin therapy at 40 mg.  Her blood pressure today is controlled on amlodipine 5 mg, lisinopril 10 mg, and she has been taking bisoprolol HCTZ 5/6.25 mg.  She has noticed an occasional skip.  When she takes her pulse.  Her ECG confirms isolated unifocal PVC followed by compensatory pars.  During prolonged auscultation this occurred rarely.  He has been on a combination beta blocker with hydrochlorothiazide diuretic.  I have uggested that she discontinue bisoprolol HCTZ.  She does not have any signs of edema.  I will start her on metoprolol XL 25 mg daily for long-acting beta blocker therapy.  If her resting pulse gets too slow and gets below 50 she will reduce this to 12.5 mg.  She is stable to undergo knee surgery and I have given her cardiac clearance.    Time spent: 25 minutes Troy Sine, MD, [facility] 08/24/2016 12:27 PM

## 2016-08-24 NOTE — Patient Instructions (Signed)
Medication Instructions:   Start new prescription for metoprolol succ 25 mg  STOP bisoprolol -hctz  Labwork:   Testing/Procedures:    Follow-Up: Your physician wants you to follow-up in: 6 months or sooner if needed. You will receive a reminder letter in the mail two months in advance. If you don't receive a letter, please call our office to schedule the follow-up appointment.    Any Other Special Instructions Will Be Listed Below (If Applicable).  If your heart rate drops below 50 BPM  Decrease the metoprolol to 1/2 tablet.

## 2016-08-31 ENCOUNTER — Ambulatory Visit: Payer: Self-pay | Admitting: Orthopedic Surgery

## 2016-09-15 ENCOUNTER — Ambulatory Visit: Payer: Self-pay | Admitting: Orthopedic Surgery

## 2016-09-15 NOTE — H&P (Signed)
TOTAL KNEE ADMISSION H&P  Patient is being admitted for right total knee arthroplasty.  Subjective:  Chief Complaint:right knee pain.  HPI: Janie Morningancy Macho, 66 y.o. female, has a history of pain and functional disability in the right knee due to arthritis and has failed non-surgical conservative treatments for greater than 12 weeks to includeNSAID's and/or analgesics, corticosteriod injections, flexibility and strengthening excercises, use of assistive devices, weight reduction as appropriate and activity modification.  Onset of symptoms was gradual, starting 4 years ago with rapidlly worsening course since that time. The patient noted no past surgery on the right knee(s).  Patient currently rates pain in the right knee(s) at 10 out of 10 with activity. Patient has night pain, worsening of pain with activity and weight bearing, pain that interferes with activities of daily living, pain with passive range of motion, crepitus and joint swelling.  Patient has evidence of subchondral cysts, subchondral sclerosis, periarticular osteophytes and joint space narrowing by imaging studies. There is no active infection.  Patient Active Problem List   Diagnosis Date Noted  . Mild renal insufficiency 06/01/2015  . CAD in native artery 11/27/2014  . Hyperlipidemia LDL goal <70 11/27/2014  . Pain in the chest   . SOB (shortness of breath)   . Essential hypertension 07/28/2014  . Exertional chest pain 07/28/2014  . Abnormal ECG 07/28/2014  . History of partial thyroidectomy 07/28/2014  . History of depression 07/28/2014   Past Medical History:  Diagnosis Date  . Depressive disorder   . Dyslipidemia   . Hypertension   . Nonalcoholic liver disease, chronic   . Osteopenia   . Palpitations     Past Surgical History:  Procedure Laterality Date  . LEFT HEART CATHETERIZATION WITH CORONARY ANGIOGRAM N/A 08/03/2014   Procedure: LEFT HEART CATHETERIZATION WITH CORONARY ANGIOGRAM;  Surgeon: Lennette Biharihomas A Kelly,  MD;  [facility];  Service: Cardiovascular;  Laterality: N/A;  . THYROIDECTOMY, PARTIAL       (Not in a hospital admission) No Known Allergies  Social History  Substance Use Topics  . Smoking status: Never Smoker  . Smokeless tobacco: Never Used  . Alcohol use No    Family History  Problem Relation Age of Onset  . Heart failure Mother   . Sudden death Father   . COPD Father   . Cancer Brother   . Cancer Sister      Review of Systems  Constitutional: Negative.   HENT: Negative.   Eyes: Negative.   Respiratory: Negative.   Cardiovascular: Negative.   Gastrointestinal: Negative.   Genitourinary: Negative.   Musculoskeletal: Positive for back pain and joint pain.  Skin: Negative.   Neurological: Negative.   Endo/Heme/Allergies: Negative.   Psychiatric/Behavioral: Negative.     Objective:  Physical Exam  Vitals reviewed. Constitutional: She is oriented to person, place, and time. She appears well-developed and well-nourished.  HENT:  Head: Normocephalic and atraumatic.  Eyes: Conjunctivae and EOM are normal. Pupils are equal, round, and reactive to light.  Neck: Normal range of motion. Neck supple.  Cardiovascular: Normal rate, regular rhythm and intact distal pulses.   Respiratory: Effort normal. No respiratory distress.  GI: Soft. She exhibits no distension.  Genitourinary:  Genitourinary Comments: deferred  Musculoskeletal:       Right knee: She exhibits decreased range of motion, swelling, effusion and abnormal alignment. Tenderness found. Medial joint line and lateral joint line tenderness noted.  Neurological: She is alert and oriented to person, place, and time. She has normal reflexes.  Skin: Skin is warm and dry.  Psychiatric: She has a normal mood and affect. Her behavior is normal. Judgment and thought content normal.    Vital signs in last 24 hours: @VSRANGES @  Labs:   Estimated body mass index is 26.89 kg/m as calculated from the  following:   Height as of 08/24/16: 5\' 2"  (1.575 m).   Weight as of 08/24/16: 66.7 kg (147 lb).   Imaging Review Plain radiographs demonstrate severe degenerative joint disease of the right knee(s). The overall alignment issignificant varus. The bone quality appears to be adequate for age and reported activity level.  Assessment/Plan:  End stage arthritis, right knee   The patient history, physical examination, clinical judgment of the provider and imaging studies are consistent with end stage degenerative joint disease of the right knee(s) and total knee arthroplasty is deemed medically necessary. The treatment options including medical management, injection therapy arthroscopy and arthroplasty were discussed at length. The risks and benefits of total knee arthroplasty were presented and reviewed. The risks due to aseptic loosening, infection, stiffness, patella tracking problems, thromboembolic complications and other imponderables were discussed. The patient acknowledged the explanation, agreed to proceed with the plan and consent was signed. Patient is being admitted for inpatient treatment for surgery, pain control, PT, OT, prophylactic antibiotics, VTE prophylaxis, progressive ambulation and ADL's and discharge planning. The patient is planning to be discharged home with outpatient therapy. Already has DME.

## 2016-09-17 NOTE — Pre-Procedure Instructions (Signed)
Janie MorningNancy Egger  09/17/2016      [facility] [place] [address] [place] [id number] Phone: [phone] Fax: [phone]  [id number] - [place], [place] - [address] [place] [id number] Phone: [phone] Fax: [phone]  [id number] - [place], [place] - [address] [place] [id number] Phone: [phone] Fax: [phone]    Your procedure is scheduled on    Monday  09/28/16  [facility] at 530 A.M.  Call this number if you have problems the morning of surgery:  [phone]   Remember:  Do not eat food or drink liquids after midnight.  Take these medicines the morning of surgery with A SIP OF WATER   AMLODIPINE (NORVASC), EYE DROPS, METOPROLOL (TOPROL)      (STOP 7 DAYS PRIOR TO SURGERY- ASPIRIN OR ASPIRIN PRODUCTS, IBUPROFEN/ ADVIL/ MOTRIN, GOODY POWDERS, BC'S, COENZYME  Q 10, OMEGA 3 FISH OIL, TURMERIC, HERBAL MEDICINES)   Do not wear jewelry, make-up or nail polish.  Do not wear lotions, powders, or perfumes, or deoderant.  Do not shave 48 hours prior to surgery.  Men may shave face and neck.  Do not bring valuables to the hospital.  [facility] is not responsible for any belongings or valuables.  Contacts, dentures or bridgework may not be worn into surgery.  Leave your suitcase in the car.  After surgery it may be brought to your room.  For patients admitted to the hospital, discharge time will be determined by your treatment team.  Patients discharged the day of surgery will not be allowed to drive home.   Name and phone number of your driver:    Special instructions:  [facility] - Preparing for Surgery  Before surgery, you can play an important role.  Because skin is not sterile, your skin needs to be as free of germs as possible.  You can reduce the number of germs on you skin by washing  with CHG (chlorahexidine gluconate) soap before surgery.  CHG is an antiseptic cleaner which kills germs and bonds with the skin to continue killing germs even after washing.  Please DO NOT use if you have an allergy to CHG or antibacterial soaps.  If your skin becomes reddened/irritated stop using the CHG and inform your nurse when you arrive at Short Stay.  Do not shave (including legs and underarms) for at least 48 hours prior to the first CHG shower.  You may shave your face.  Please follow these instructions carefully:   1.  Shower with CHG Soap the night before surgery and the                                morning of Surgery.  2.  If you choose to wash your hair, wash your hair first as usual with your       normal shampoo.  3.  After you shampoo, rinse your hair and body thoroughly to remove the                      Shampoo.  4.  Use CHG as you would any other liquid soap.  You can apply chg directly       to the skin and wash gently with scrungie or a clean washcloth.  5.  Apply the  CHG Soap to your body ONLY FROM THE NECK DOWN.        Do not use on open wounds or open sores.  Avoid contact with your eyes,       ears, mouth and genitals (private parts).  Wash genitals (private parts)       with your normal soap.  6.  Wash thoroughly, paying special attention to the area where your surgery        will be performed.  7.  Thoroughly rinse your body with warm water from the neck down.  8.  DO NOT shower/wash with your normal soap after using and rinsing off       the CHG Soap.  9.  Pat yourself dry with a clean towel.            10.  Wear clean pajamas.            11.  Place clean sheets on your bed the night of your first shower and do not        sleep with pets.  Day of Surgery  Do not apply any lotions/deoderants the morning of surgery.  Please wear clean clothes to the hospital/surgery center.    Please read over the following fact sheets that you were given. MRSA Information and  Surgical Site Infection Prevention

## 2016-09-18 ENCOUNTER — Encounter (HOSPITAL_COMMUNITY)
Admission: RE | Admit: 2016-09-18 | Discharge: 2016-09-18 | Disposition: A | Discharge status: Home / Self Care | Payer: Medicare Other | Source: Ambulatory Visit | Attending: Orthopedic Surgery | Admitting: Orthopedic Surgery

## 2016-09-18 ENCOUNTER — Encounter (HOSPITAL_COMMUNITY): Payer: Self-pay | Admitting: *Deleted

## 2016-09-18 DIAGNOSIS — Z01818 Encounter for other preprocedural examination: Secondary | ICD-10-CM | POA: Diagnosis present

## 2016-09-18 DIAGNOSIS — M1711 Unilateral primary osteoarthritis, right knee: Secondary | ICD-10-CM | POA: Diagnosis not present

## 2016-09-18 DIAGNOSIS — Z79899 Other long term (current) drug therapy: Secondary | ICD-10-CM | POA: Insufficient documentation

## 2016-09-18 LAB — CBC
HCT: 38 % (ref 36.0–46.0)
HEMOGLOBIN: 13 g/dL (ref 12.0–15.0)
MCH: 30 pg (ref 26.0–34.0)
MCHC: 34.2 g/dL (ref 30.0–36.0)
MCV: 87.8 fL (ref 78.0–100.0)
Platelets: 239 10*3/uL (ref 150–400)
RBC: 4.33 MIL/uL (ref 3.87–5.11)
RDW: 12.9 % (ref 11.5–15.5)
WBC: 8.2 10*3/uL (ref 4.0–10.5)

## 2016-09-18 LAB — COMPREHENSIVE METABOLIC PANEL
ALK PHOS: 59 U/L (ref 38–126)
ALT: 20 U/L (ref 14–54)
ANION GAP: 12 (ref 5–15)
AST: 26 U/L (ref 15–41)
Albumin: 3.9 g/dL (ref 3.5–5.0)
BUN: 17 mg/dL (ref 6–20)
CALCIUM: 9.6 mg/dL (ref 8.9–10.3)
CO2: 23 mmol/L (ref 22–32)
Chloride: 104 mmol/L (ref 101–111)
Creatinine, Ser: 1.32 mg/dL — ABNORMAL HIGH (ref 0.44–1.00)
GFR, EST AFRICAN AMERICAN: 47 mL/min — AB (ref >60)
GFR, EST NON AFRICAN AMERICAN: 41 mL/min — AB (ref >60)
Glucose, Bld: 198 mg/dL — ABNORMAL HIGH (ref 65–99)
Potassium: 4.2 mmol/L (ref 3.5–5.1)
SODIUM: 139 mmol/L (ref 135–145)
TOTAL PROTEIN: 6.5 g/dL (ref 6.5–8.1)
Total Bilirubin: 0.6 mg/dL (ref 0.3–1.2)

## 2016-09-18 LAB — PROTIME-INR
INR: 1.04
PROTHROMBIN TIME: 13.6 s (ref 11.4–15.2)

## 2016-09-18 LAB — ABO/RH: ABO/RH(D): A POS

## 2016-09-18 LAB — TYPE AND SCREEN
ABO/RH(D): A POS
Antibody Screen: NEGATIVE

## 2016-09-18 LAB — SURGICAL PCR SCREEN
MRSA, PCR: NEGATIVE
Staphylococcus aureus: NEGATIVE

## 2016-09-25 MED ORDER — ACETAMINOPHEN 10 MG/ML IV SOLN
1000.0000 mg | INTRAVENOUS | Status: AC
  Administered 2016-09-28: 1000 mg via INTRAVENOUS

## 2016-09-25 MED ORDER — SODIUM CHLORIDE 0.9 % IV SOLN: INTRAVENOUS | Status: DC

## 2016-09-25 MED ORDER — TRANEXAMIC ACID 1000 MG/10ML IV SOLN
1000.0000 mg | INTRAVENOUS | Status: AC
  Administered 2016-09-28: 1000 mg via INTRAVENOUS
  Filled 2016-09-25: qty 10

## 2016-09-25 MED ORDER — CEFAZOLIN SODIUM-DEXTROSE 2-4 GM/100ML-% IV SOLN
2.0000 g | INTRAVENOUS | Status: AC
  Administered 2016-09-28: 2 g via INTRAVENOUS
  Filled 2016-09-25: qty 100

## 2016-09-27 NOTE — Anesthesia Preprocedure Evaluation (Addendum)
Anesthesia Evaluation  Patient identified by MRN, date of birth, ID band Patient awake    Reviewed: Allergy & Precautions, H&P , NPO status , Patient's Chart, lab work & pertinent test results  Airway Mallampati: III  TM Distance: >3 FB Neck ROM: Full    Dental no notable dental hx. (+) Teeth Intact, Dental Advisory Given   Pulmonary neg pulmonary ROS,    Pulmonary exam normal breath sounds clear to auscultation       Cardiovascular hypertension, Pt. on medications  Rhythm:Regular Rate:Normal     Neuro/Psych Depression negative neurological ROS  negative psych ROS   GI/Hepatic negative GI ROS, Neg liver ROS,   Endo/Other  negative endocrine ROS  Renal/GU negative Renal ROS  negative genitourinary   Musculoskeletal   Abdominal   Peds  Hematology negative hematology ROS (+)   Anesthesia Other Findings   Reproductive/Obstetrics negative OB ROS                            Anesthesia Physical Anesthesia Plan  ASA: II  Anesthesia Plan: Spinal   Post-op Pain Management:  Regional for Post-op pain   Induction: Intravenous  Airway Management Planned: Simple Face Mask  Additional Equipment:   Intra-op Plan:   Post-operative Plan:   Informed Consent: I have reviewed the patients History and Physical, chart, labs and discussed the procedure including the risks, benefits and alternatives for the proposed anesthesia with the patient or authorized representative who has indicated his/her understanding and acceptance.   Dental advisory given  Plan Discussed with: CRNA  Anesthesia Plan Comments:         Anesthesia Quick Evaluation

## 2016-09-28 ENCOUNTER — Inpatient Hospital Stay (HOSPITAL_COMMUNITY): Payer: Medicare Other | Admitting: Anesthesiology

## 2016-09-28 ENCOUNTER — Encounter (HOSPITAL_COMMUNITY)
Admission: RE | Disposition: A | Discharge status: Home / Self Care | Payer: Self-pay | Source: Ambulatory Visit | Attending: Orthopedic Surgery

## 2016-09-28 ENCOUNTER — Inpatient Hospital Stay (HOSPITAL_COMMUNITY): Payer: Medicare Other

## 2016-09-28 ENCOUNTER — Encounter (HOSPITAL_COMMUNITY): Payer: Self-pay | Admitting: *Deleted

## 2016-09-28 ENCOUNTER — Inpatient Hospital Stay (HOSPITAL_COMMUNITY)
Admission: RE | Admit: 2016-09-28 | Discharge: 2016-09-30 | DRG: 470 | Disposition: A | Discharge status: Home / Self Care | Payer: Medicare Other | Source: Ambulatory Visit | Attending: Orthopedic Surgery | Admitting: Orthopedic Surgery

## 2016-09-28 DIAGNOSIS — E785 Hyperlipidemia, unspecified: Secondary | ICD-10-CM | POA: Diagnosis present

## 2016-09-28 DIAGNOSIS — Z96651 Presence of right artificial knee joint: Secondary | ICD-10-CM

## 2016-09-28 DIAGNOSIS — Z09 Encounter for follow-up examination after completed treatment for conditions other than malignant neoplasm: Secondary | ICD-10-CM

## 2016-09-28 DIAGNOSIS — M1711 Unilateral primary osteoarthritis, right knee: Secondary | ICD-10-CM | POA: Diagnosis present

## 2016-09-28 DIAGNOSIS — M8580 Other specified disorders of bone density and structure, unspecified site: Secondary | ICD-10-CM | POA: Diagnosis present

## 2016-09-28 DIAGNOSIS — K769 Liver disease, unspecified: Secondary | ICD-10-CM | POA: Diagnosis present

## 2016-09-28 DIAGNOSIS — M25561 Pain in right knee: Secondary | ICD-10-CM | POA: Diagnosis present

## 2016-09-28 DIAGNOSIS — I2510 Atherosclerotic heart disease of native coronary artery without angina pectoris: Secondary | ICD-10-CM | POA: Diagnosis present

## 2016-09-28 DIAGNOSIS — I10 Essential (primary) hypertension: Secondary | ICD-10-CM | POA: Diagnosis present

## 2016-09-28 HISTORY — PX: KNEE ARTHROPLASTY: SHX992

## 2016-09-28 LAB — GLUCOSE, CAPILLARY: GLUCOSE-CAPILLARY: 100 mg/dL — AB (ref 65–99)

## 2016-09-28 SURGERY — ARTHROPLASTY, KNEE, TOTAL, USING IMAGELESS COMPUTER-ASSISTED NAVIGATION
Anesthesia: Spinal | Laterality: Right

## 2016-09-28 MED ORDER — MAGNESIUM GLUCONATE 500 MG PO TABS
500.0000 mg | ORAL_TABLET | Freq: Every day | ORAL | Status: DC
  Administered 2016-09-29 – 2016-09-30 (×2): 500 mg via ORAL
  Filled 2016-09-28 (×3): qty 1

## 2016-09-28 MED ORDER — METHOCARBAMOL 1000 MG/10ML IJ SOLN
500.0000 mg | Freq: Four times a day (QID) | INTRAMUSCULAR | Status: DC | PRN
  Filled 2016-09-28: qty 5

## 2016-09-28 MED ORDER — ONDANSETRON HCL 4 MG/2ML IJ SOLN
INTRAMUSCULAR | Status: AC
  Filled 2016-09-28: qty 2

## 2016-09-28 MED ORDER — ONDANSETRON HCL 4 MG PO TABS: 4.0000 mg | ORAL_TABLET | Freq: Four times a day (QID) | ORAL | Status: DC | PRN

## 2016-09-28 MED ORDER — DOCUSATE SODIUM 100 MG PO CAPS
100.0000 mg | ORAL_CAPSULE | Freq: Two times a day (BID) | ORAL | Status: DC
  Administered 2016-09-28 – 2016-09-30 (×4): 100 mg via ORAL
  Filled 2016-09-28 (×4): qty 1

## 2016-09-28 MED ORDER — BIOTIN 5 MG PO CAPS: 5.0000 mg | ORAL_CAPSULE | Freq: Every day | ORAL | Status: DC

## 2016-09-28 MED ORDER — SODIUM CHLORIDE 0.9 % IV SOLN: INTRAVENOUS | Status: DC

## 2016-09-28 MED ORDER — ONDANSETRON HCL 4 MG/2ML IJ SOLN
INTRAMUSCULAR | Status: DC | PRN
  Administered 2016-09-28: 4 mg via INTRAVENOUS

## 2016-09-28 MED ORDER — MIDAZOLAM HCL 2 MG/2ML IJ SOLN
INTRAMUSCULAR | Status: AC
  Filled 2016-09-28: qty 2

## 2016-09-28 MED ORDER — PROPOFOL 1000 MG/100ML IV EMUL
INTRAVENOUS | Status: AC
  Filled 2016-09-28: qty 100

## 2016-09-28 MED ORDER — VITAMIN D 1000 UNITS PO TABS
5000.0000 [IU] | ORAL_TABLET | Freq: Every day | ORAL | Status: DC
  Administered 2016-09-29 – 2016-09-30 (×2): 5000 [IU] via ORAL
  Filled 2016-09-28 (×2): qty 5

## 2016-09-28 MED ORDER — PROPOFOL 10 MG/ML IV BOLUS
INTRAVENOUS | Status: AC
  Filled 2016-09-28: qty 20

## 2016-09-28 MED ORDER — MIDAZOLAM HCL 5 MG/5ML IJ SOLN
INTRAMUSCULAR | Status: DC | PRN
  Administered 2016-09-28: 2 mg via INTRAVENOUS

## 2016-09-28 MED ORDER — POLYETHYLENE GLYCOL 3350 17 G PO PACK: 17.0000 g | PACK | Freq: Every day | ORAL | Status: DC | PRN

## 2016-09-28 MED ORDER — HYDROMORPHONE HCL 1 MG/ML IJ SOLN
INTRAMUSCULAR | Status: AC
  Filled 2016-09-28: qty 1

## 2016-09-28 MED ORDER — LIDOCAINE 2% (20 MG/ML) 5 ML SYRINGE
INTRAMUSCULAR | Status: AC
  Filled 2016-09-28: qty 5

## 2016-09-28 MED ORDER — METOCLOPRAMIDE HCL 5 MG PO TABS: 5.0000 mg | ORAL_TABLET | Freq: Three times a day (TID) | ORAL | Status: DC | PRN

## 2016-09-28 MED ORDER — METOCLOPRAMIDE HCL 5 MG/ML IJ SOLN
5.0000 mg | Freq: Three times a day (TID) | INTRAMUSCULAR | Status: DC | PRN
  Administered 2016-09-28: 10 mg via INTRAVENOUS
  Filled 2016-09-28: qty 2

## 2016-09-28 MED ORDER — PHENYLEPHRINE 40 MCG/ML (10ML) SYRINGE FOR IV PUSH (FOR BLOOD PRESSURE SUPPORT)
PREFILLED_SYRINGE | INTRAVENOUS | Status: AC
  Filled 2016-09-28: qty 10

## 2016-09-28 MED ORDER — CEFAZOLIN IN D5W 1 GM/50ML IV SOLN
INTRAVENOUS | Status: AC
  Filled 2016-09-28: qty 50

## 2016-09-28 MED ORDER — KETOROLAC TROMETHAMINE 15 MG/ML IJ SOLN
7.5000 mg | Freq: Four times a day (QID) | INTRAMUSCULAR | Status: AC
  Administered 2016-09-28 – 2016-09-29 (×4): 7.5 mg via INTRAVENOUS
  Filled 2016-09-28 (×3): qty 1

## 2016-09-28 MED ORDER — SENNA 8.6 MG PO TABS
2.0000 | ORAL_TABLET | Freq: Every day | ORAL | Status: DC
  Administered 2016-09-28 – 2016-09-29 (×2): 17.2 mg via ORAL
  Filled 2016-09-28 (×2): qty 2

## 2016-09-28 MED ORDER — DIPHENHYDRAMINE HCL 12.5 MG/5ML PO ELIX
12.5000 mg | ORAL_SOLUTION | ORAL | Status: DC | PRN
Start: 2016-09-28 — End: 2016-09-30

## 2016-09-28 MED ORDER — HYDROMORPHONE HCL 1 MG/ML IJ SOLN
0.2500 mg | INTRAMUSCULAR | Status: DC | PRN
  Administered 2016-09-28 (×4): 0.5 mg via INTRAVENOUS

## 2016-09-28 MED ORDER — DEXAMETHASONE SODIUM PHOSPHATE 10 MG/ML IJ SOLN
10.0000 mg | Freq: Once | INTRAMUSCULAR | Status: AC
  Administered 2016-09-29: 10 mg via INTRAVENOUS
  Filled 2016-09-28: qty 1

## 2016-09-28 MED ORDER — CHLORHEXIDINE GLUCONATE 4 % EX LIQD: 60.0000 mL | Freq: Once | CUTANEOUS | Status: DC

## 2016-09-28 MED ORDER — CEFAZOLIN IN D5W 1 GM/50ML IV SOLN
1.0000 g | Freq: Four times a day (QID) | INTRAVENOUS | Status: AC
  Administered 2016-09-28 (×2): 1 g via INTRAVENOUS
  Filled 2016-09-28: qty 50

## 2016-09-28 MED ORDER — HYDROCODONE-ACETAMINOPHEN 5-325 MG PO TABS: 1.0000 | ORAL_TABLET | ORAL | 0 refills | Status: DC | PRN

## 2016-09-28 MED ORDER — HYDROMORPHONE HCL 1 MG/ML IJ SOLN
0.5000 mg | INTRAMUSCULAR | Status: DC | PRN
  Administered 2016-09-28: 1 mg via INTRAVENOUS

## 2016-09-28 MED ORDER — METOPROLOL SUCCINATE ER 25 MG PO TB24
25.0000 mg | ORAL_TABLET | Freq: Every day | ORAL | Status: DC
  Administered 2016-09-29 – 2016-09-30 (×2): 25 mg via ORAL
  Filled 2016-09-28 (×2): qty 1

## 2016-09-28 MED ORDER — FENTANYL CITRATE (PF) 100 MCG/2ML IJ SOLN
INTRAMUSCULAR | Status: AC
  Filled 2016-09-28: qty 4

## 2016-09-28 MED ORDER — AMLODIPINE BESYLATE 5 MG PO TABS
5.0000 mg | ORAL_TABLET | Freq: Every day | ORAL | Status: DC
  Administered 2016-09-29 – 2016-09-30 (×2): 5 mg via ORAL
  Filled 2016-09-28 (×2): qty 1

## 2016-09-28 MED ORDER — SENNA 8.6 MG PO TABS: 2.0000 | ORAL_TABLET | Freq: Every day | ORAL | 3 refills | Status: DC

## 2016-09-28 MED ORDER — BUPIVACAINE-EPINEPHRINE (PF) 0.5% -1:200000 IJ SOLN
INTRAMUSCULAR | Status: AC
  Filled 2016-09-28: qty 30

## 2016-09-28 MED ORDER — HYDROMORPHONE HCL 2 MG/ML IJ SOLN
0.5000 mg | INTRAMUSCULAR | Status: DC | PRN
  Administered 2016-09-29: 1 mg via INTRAVENOUS
  Filled 2016-09-28: qty 1

## 2016-09-28 MED ORDER — HYDROMORPHONE HCL 1 MG/ML IJ SOLN
INTRAMUSCULAR | Status: AC
Start: 2016-09-28 — End: 2016-09-29
  Filled 2016-09-28: qty 1

## 2016-09-28 MED ORDER — ONDANSETRON HCL 4 MG PO TABS: 4.0000 mg | ORAL_TABLET | Freq: Three times a day (TID) | ORAL | 0 refills | Status: DC | PRN

## 2016-09-28 MED ORDER — ALUM & MAG HYDROXIDE-SIMETH 200-200-20 MG/5ML PO SUSP: 30.0000 mL | ORAL | Status: DC | PRN

## 2016-09-28 MED ORDER — SODIUM CHLORIDE 0.9 % IR SOLN
Status: DC | PRN
  Administered 2016-09-28: 3000 mL

## 2016-09-28 MED ORDER — KETOROLAC TROMETHAMINE 30 MG/ML IJ SOLN
INTRAMUSCULAR | Status: AC
  Filled 2016-09-28: qty 1

## 2016-09-28 MED ORDER — NITROGLYCERIN 0.4 MG SL SUBL: 0.4000 mg | SUBLINGUAL_TABLET | SUBLINGUAL | Status: DC | PRN

## 2016-09-28 MED ORDER — MENTHOL 3 MG MT LOZG: 1.0000 | LOZENGE | OROMUCOSAL | Status: DC | PRN

## 2016-09-28 MED ORDER — EPHEDRINE SULFATE 50 MG/ML IJ SOLN
INTRAMUSCULAR | Status: DC | PRN
  Administered 2016-09-28: 5 mg via INTRAVENOUS

## 2016-09-28 MED ORDER — KETOROLAC TROMETHAMINE 15 MG/ML IJ SOLN
INTRAMUSCULAR | Status: AC
  Filled 2016-09-28: qty 1

## 2016-09-28 MED ORDER — PHENOL 1.4 % MT LIQD: 1.0000 | OROMUCOSAL | Status: DC | PRN

## 2016-09-28 MED ORDER — ASPIRIN 81 MG PO CHEW
81.0000 mg | CHEWABLE_TABLET | Freq: Two times a day (BID) | ORAL | Status: DC
  Administered 2016-09-28 – 2016-09-30 (×4): 81 mg via ORAL
  Filled 2016-09-28 (×4): qty 1

## 2016-09-28 MED ORDER — 0.9 % SODIUM CHLORIDE (POUR BTL) OPTIME
TOPICAL | Status: DC | PRN
  Administered 2016-09-28: 1000 mL

## 2016-09-28 MED ORDER — METHOCARBAMOL 500 MG PO TABS
500.0000 mg | ORAL_TABLET | Freq: Four times a day (QID) | ORAL | Status: DC | PRN
  Administered 2016-09-28 – 2016-09-30 (×4): 500 mg via ORAL
  Filled 2016-09-28 (×4): qty 1

## 2016-09-28 MED ORDER — LACTATED RINGERS IV SOLN
INTRAVENOUS | Status: DC | PRN
  Administered 2016-09-28 (×2): via INTRAVENOUS

## 2016-09-28 MED ORDER — ACETAMINOPHEN 650 MG RE SUPP: 650.0000 mg | Freq: Four times a day (QID) | RECTAL | Status: DC | PRN

## 2016-09-28 MED ORDER — ROPIVACAINE HCL 7.5 MG/ML IJ SOLN
INTRAMUSCULAR | Status: DC | PRN
  Administered 2016-09-28: 20 mL via PERINEURAL

## 2016-09-28 MED ORDER — KETOROLAC TROMETHAMINE 30 MG/ML IJ SOLN
INTRAMUSCULAR | Status: DC | PRN
  Administered 2016-09-28: 30 mg via INTRAVENOUS

## 2016-09-28 MED ORDER — METHOCARBAMOL 500 MG PO TABS
ORAL_TABLET | ORAL | Status: AC
  Filled 2016-09-28: qty 1

## 2016-09-28 MED ORDER — LISINOPRIL 5 MG PO TABS
5.0000 mg | ORAL_TABLET | Freq: Every day | ORAL | Status: DC
  Administered 2016-09-29 – 2016-09-30 (×2): 5 mg via ORAL
  Filled 2016-09-28 (×2): qty 1

## 2016-09-28 MED ORDER — DOCUSATE SODIUM 100 MG PO CAPS: 100.0000 mg | ORAL_CAPSULE | Freq: Two times a day (BID) | ORAL | 3 refills | Status: DC

## 2016-09-28 MED ORDER — HYDROCODONE-ACETAMINOPHEN 5-325 MG PO TABS
1.0000 | ORAL_TABLET | ORAL | Status: DC | PRN
  Administered 2016-09-29 – 2016-09-30 (×6): 2 via ORAL
  Filled 2016-09-28 (×6): qty 2

## 2016-09-28 MED ORDER — ACETAMINOPHEN 10 MG/ML IV SOLN
INTRAVENOUS | Status: AC
  Filled 2016-09-28: qty 100

## 2016-09-28 MED ORDER — TRANEXAMIC ACID 1000 MG/10ML IV SOLN
1000.0000 mg | Freq: Once | INTRAVENOUS | Status: AC
  Administered 2016-09-28: 1000 mg via INTRAVENOUS
  Filled 2016-09-28: qty 10

## 2016-09-28 MED ORDER — SODIUM CHLORIDE 0.9 % IJ SOLN
INTRAMUSCULAR | Status: DC | PRN
  Administered 2016-09-28: 50 mL

## 2016-09-28 MED ORDER — ONDANSETRON HCL 4 MG/2ML IJ SOLN
4.0000 mg | Freq: Four times a day (QID) | INTRAMUSCULAR | Status: DC | PRN
  Administered 2016-09-28: 4 mg via INTRAVENOUS
  Filled 2016-09-28 (×3): qty 2

## 2016-09-28 MED ORDER — LIDOCAINE HCL (CARDIAC) 20 MG/ML IV SOLN
INTRAVENOUS | Status: DC | PRN
  Administered 2016-09-28: 70 mg via INTRATRACHEAL

## 2016-09-28 MED ORDER — BUPIVACAINE-EPINEPHRINE (PF) 0.5% -1:200000 IJ SOLN
INTRAMUSCULAR | Status: DC | PRN
  Administered 2016-09-28: 30 mL via PERINEURAL

## 2016-09-28 MED ORDER — FENTANYL CITRATE (PF) 100 MCG/2ML IJ SOLN
INTRAMUSCULAR | Status: DC | PRN
  Administered 2016-09-28 (×3): 50 ug via INTRAVENOUS
  Administered 2016-09-28: 25 ug via INTRAVENOUS

## 2016-09-28 MED ORDER — ATORVASTATIN CALCIUM 40 MG PO TABS
40.0000 mg | ORAL_TABLET | Freq: Every day | ORAL | Status: DC
  Administered 2016-09-28 – 2016-09-29 (×2): 40 mg via ORAL
  Filled 2016-09-28 (×2): qty 1

## 2016-09-28 MED ORDER — ASPIRIN 81 MG PO TABS: 81.0000 mg | ORAL_TABLET | Freq: Two times a day (BID) | ORAL | 1 refills | Status: DC

## 2016-09-28 MED ORDER — PROPOFOL 500 MG/50ML IV EMUL
INTRAVENOUS | Status: DC | PRN
  Administered 2016-09-28: 50 ug/kg/min via INTRAVENOUS

## 2016-09-28 MED ORDER — BUPIVACAINE IN DEXTROSE 0.75-8.25 % IT SOLN
INTRATHECAL | Status: DC | PRN
  Administered 2016-09-28: 15 mg via INTRATHECAL

## 2016-09-28 MED ORDER — POVIDONE-IODINE 10 % EX SWAB: 2.0000 "application " | Freq: Once | CUTANEOUS | Status: DC

## 2016-09-28 MED ORDER — ACETAMINOPHEN 325 MG PO TABS: 650.0000 mg | ORAL_TABLET | Freq: Four times a day (QID) | ORAL | Status: DC | PRN

## 2016-09-28 SURGICAL SUPPLY — 49 items
ALCOHOL ISOPROPYL (RUBBING) (MISCELLANEOUS) ×3 IMPLANT
BANDAGE ACE 6X5 VEL STRL LF (GAUZE/BANDAGES/DRESSINGS) ×3 IMPLANT
BLADE SAW RECIP 87.9 MT (BLADE) ×3 IMPLANT
CAPT KNEE TRIATH TK-4 ×3 IMPLANT
CHLORAPREP W/TINT 26ML (MISCELLANEOUS) ×6 IMPLANT
CUFF TOURNIQUET SINGLE 34IN LL (TOURNIQUET CUFF) ×3 IMPLANT
DERMABOND ADVANCED (GAUZE/BANDAGES/DRESSINGS) ×4
DERMABOND ADVANCED .7 DNX12 (GAUZE/BANDAGES/DRESSINGS) ×2 IMPLANT
DRAIN HEMOVAC 7FR (DRAIN) ×3 IMPLANT
DRAPE EXTREMITY TIBURON (DRAPES) ×3 IMPLANT
DRAPE SHEET LG 3/4 BI-LAMINATE (DRAPES) ×6 IMPLANT
DRAPE U-SHAPE 47X51 STRL (DRAPES) ×3 IMPLANT
DRAPE UNIVERSAL PACK (DRAPES) ×3 IMPLANT
DRSG AQUACEL AG ADV 3.5X14 (GAUZE/BANDAGES/DRESSINGS) ×3 IMPLANT
DRSG TEGADERM 2-3/8X2-3/4 SM (GAUZE/BANDAGES/DRESSINGS) ×3 IMPLANT
ELECT REM PT RETURN 9FT ADLT (ELECTROSURGICAL) ×3
ELECTRODE REM PT RTRN 9FT ADLT (ELECTROSURGICAL) ×1 IMPLANT
EVACUATOR 1/8 PVC DRAIN (DRAIN) IMPLANT
GLOVE BIO SURGEON STRL SZ8.5 (GLOVE) ×9 IMPLANT
GLOVE BIOGEL PI IND STRL 7.5 (GLOVE) ×1 IMPLANT
GLOVE BIOGEL PI IND STRL 8.5 (GLOVE) ×1 IMPLANT
GLOVE BIOGEL PI INDICATOR 7.5 (GLOVE) ×2
GLOVE BIOGEL PI INDICATOR 8.5 (GLOVE) ×2
GLOVE SURG SS PI 8.0 STRL IVOR (GLOVE) ×12 IMPLANT
GOWN STRL REUS W/ TWL XL LVL3 (GOWN DISPOSABLE) ×1 IMPLANT
GOWN STRL REUS W/TWL 2XL LVL3 (GOWN DISPOSABLE) ×9 IMPLANT
GOWN STRL REUS W/TWL XL LVL3 (GOWN DISPOSABLE) ×2
HANDPIECE INTERPULSE COAX TIP (DISPOSABLE) ×2
HOOD PEEL AWAY FLYTE STAYCOOL (MISCELLANEOUS) ×6 IMPLANT
KIT BASIN OR (CUSTOM PROCEDURE TRAY) ×3 IMPLANT
MANIFOLD NEPTUNE II (INSTRUMENTS) ×3 IMPLANT
NEEDLE SPNL 18GX3.5 QUINCKE PK (NEEDLE) ×6 IMPLANT
PACK TOTAL JOINT (CUSTOM PROCEDURE TRAY) ×3 IMPLANT
PACK TOTAL KNEE CUSTOM (KITS) ×3 IMPLANT
PADDING CAST COTTON 6X4 STRL (CAST SUPPLIES) ×3 IMPLANT
SAW OSC TIP CART 19.5X105X1.3 (SAW) ×3 IMPLANT
SEALER BIPOLAR AQUA 6.0 (INSTRUMENTS) ×6 IMPLANT
SET HNDPC FAN SPRY TIP SCT (DISPOSABLE) ×1 IMPLANT
SET PAD KNEE POSITIONER (MISCELLANEOUS) ×3 IMPLANT
SUT MNCRL AB 3-0 PS2 27 (SUTURE) ×3 IMPLANT
SUT MON AB 2-0 CT1 36 (SUTURE) ×6 IMPLANT
SUT VIC AB 1 CTX 27 (SUTURE) ×3 IMPLANT
SUT VIC AB 2-0 CT1 27 (SUTURE) ×2
SUT VIC AB 2-0 CT1 TAPERPNT 27 (SUTURE) ×1 IMPLANT
SUT VLOC 180 0 24IN GS25 (SUTURE) ×3 IMPLANT
SYR 50ML LL SCALE MARK (SYRINGE) ×6 IMPLANT
TOWER CARTRIDGE SMART MIX (DISPOSABLE) ×3 IMPLANT
TRAY CATH 16FR W/PLASTIC CATH (SET/KITS/TRAYS/PACK) IMPLANT
WRAP KNEE MAXI GEL POST OP (GAUZE/BANDAGES/DRESSINGS) ×3 IMPLANT

## 2016-09-28 NOTE — Interval H&P Note (Signed)
History and Physical Interval Note:  09/28/2016 7:27 AM  [name]  has presented today for surgery, with the diagnosis of Degenerative joint disease right knee  The various methods of treatment have been discussed with the patient and family. After consideration of risks, benefits and other options for treatment, the patient has consented to  Procedure(s): COMPUTER ASSISTED TOTAL KNEE ARTHROPLASTY (Right) as a surgical intervention .  The patient's history has been reviewed, patient examined, no change in status, stable for surgery.  I have reviewed the patient's chart and labs.  Questions were answered to the patient's satisfaction.     Odaly Peri, Cloyde ReamsBrian James

## 2016-09-28 NOTE — Anesthesia Procedure Notes (Signed)
Spinal  Patient location during procedure: OR Start time: 09/28/2016 7:35 AM End time: 09/28/2016 7:40 AM Staffing Anesthesiologist: Gaynelle AduFITZGERALD, Mandy Fitzwater Performed: anesthesiologist  Preanesthetic Checklist Completed: patient identified, surgical consent, pre-op evaluation, timeout performed, IV checked, risks and benefits discussed and monitors and equipment checked Spinal Block Patient position: sitting Prep: DuraPrep Patient monitoring: cardiac monitor, continuous pulse ox and blood pressure Approach: midline Location: L3-4 Injection technique: single-shot Needle Needle type: Pencan  Needle gauge: 24 G Needle length: 9 cm Assessment Sensory level: T8 Additional Notes Functioning IV was confirmed and monitors were applied. Sterile prep and drape, including hand hygiene and sterile gloves were used. The patient was positioned and the spine was prepped. The skin was anesthetized with lidocaine.  Free flow of clear CSF was obtained prior to injecting local anesthetic into the CSF.  The spinal needle aspirated freely following injection.  The needle was carefully withdrawn.  The patient tolerated the procedure well.

## 2016-09-28 NOTE — H&P (View-Only) (Signed)
TOTAL KNEE ADMISSION H&P  Patient is being admitted for right total knee arthroplasty.  Subjective:  Chief Complaint:right knee pain.  HPI: [name], 66 y.o. female, has a history of pain and functional disability in the right knee due to arthritis and has failed non-surgical conservative treatments for greater than 12 weeks to includeNSAID's and/or analgesics, corticosteriod injections, flexibility and strengthening excercises, use of assistive devices, weight reduction as appropriate and activity modification.  Onset of symptoms was gradual, starting 4 years ago with rapidlly worsening course since that time. The patient noted no past surgery on the right knee(s).  Patient currently rates pain in the right knee(s) at 10 out of 10 with activity. Patient has night pain, worsening of pain with activity and weight bearing, pain that interferes with activities of daily living, pain with passive range of motion, crepitus and joint swelling.  Patient has evidence of subchondral cysts, subchondral sclerosis, periarticular osteophytes and joint space narrowing by imaging studies. There is no active infection.  Patient Active Problem List   Diagnosis Date Noted  . Mild renal insufficiency 06/01/2015  . CAD in native artery 11/27/2014  . Hyperlipidemia LDL goal <70 11/27/2014  . Pain in the chest   . SOB (shortness of breath)   . Essential hypertension 07/28/2014  . Exertional chest pain 07/28/2014  . Abnormal ECG 07/28/2014  . History of partial thyroidectomy 07/28/2014  . History of depression 07/28/2014   Past Medical History:  Diagnosis Date  . Depressive disorder   . Dyslipidemia   . Hypertension   . Nonalcoholic liver disease, chronic   . Osteopenia   . Palpitations     Past Surgical History:  Procedure Laterality Date  . LEFT HEART CATHETERIZATION WITH CORONARY ANGIOGRAM N/A 08/03/2014   Procedure: LEFT HEART CATHETERIZATION WITH CORONARY ANGIOGRAM;  Surgeon: Lennette Biharihomas A Kelly,  MD;  [facility];  Service: Cardiovascular;  Laterality: N/A;  . THYROIDECTOMY, PARTIAL       (Not in a hospital admission) No Known Allergies  Social History  Substance Use Topics  . Smoking status: Never Smoker  . Smokeless tobacco: Never Used  . Alcohol use No    Family History  Problem Relation Age of Onset  . Heart failure Mother   . Sudden death Father   . COPD Father   . Cancer Brother   . Cancer Sister      Review of Systems  Constitutional: Negative.   HENT: Negative.   Eyes: Negative.   Respiratory: Negative.   Cardiovascular: Negative.   Gastrointestinal: Negative.   Genitourinary: Negative.   Musculoskeletal: Positive for back pain and joint pain.  Skin: Negative.   Neurological: Negative.   Endo/Heme/Allergies: Negative.   Psychiatric/Behavioral: Negative.     Objective:  Physical Exam  Vitals reviewed. Constitutional: She is oriented to person, place, and time. She appears well-developed and well-nourished.  HENT:  Head: Normocephalic and atraumatic.  Eyes: Conjunctivae and EOM are normal. Pupils are equal, round, and reactive to light.  Neck: Normal range of motion. Neck supple.  Cardiovascular: Normal rate, regular rhythm and intact distal pulses.   Respiratory: Effort normal. No respiratory distress.  GI: Soft. She exhibits no distension.  Genitourinary:  Genitourinary Comments: deferred  Musculoskeletal:       Right knee: She exhibits decreased range of motion, swelling, effusion and abnormal alignment. Tenderness found. Medial joint line and lateral joint line tenderness noted.  Neurological: She is alert and oriented to person, place, and time. She has normal reflexes.  Skin: Skin is warm and dry.  Psychiatric: She has a normal mood and affect. Her behavior is normal. Judgment and thought content normal.    Vital signs in last 24 hours: @VSRANGES @  Labs:   Estimated body mass index is 26.89 kg/m as calculated from the  following:   Height as of 08/24/16: 5\' 2"  (1.575 m).   Weight as of 08/24/16: 66.7 kg (147 lb).   Imaging Review Plain radiographs demonstrate severe degenerative joint disease of the right knee(s). The overall alignment issignificant varus. The bone quality appears to be adequate for age and reported activity level.  Assessment/Plan:  End stage arthritis, right knee   The patient history, physical examination, clinical judgment of the provider and imaging studies are consistent with end stage degenerative joint disease of the right knee(s) and total knee arthroplasty is deemed medically necessary. The treatment options including medical management, injection therapy arthroscopy and arthroplasty were discussed at length. The risks and benefits of total knee arthroplasty were presented and reviewed. The risks due to aseptic loosening, infection, stiffness, patella tracking problems, thromboembolic complications and other imponderables were discussed. The patient acknowledged the explanation, agreed to proceed with the plan and consent was signed. Patient is being admitted for inpatient treatment for surgery, pain control, PT, OT, prophylactic antibiotics, VTE prophylaxis, progressive ambulation and ADL's and discharge planning. The patient is planning to be discharged home with outpatient therapy. Already has DME.

## 2016-09-28 NOTE — Transfer of Care (Signed)
Immediate Anesthesia Transfer of Care Note  Patient: [name]  Procedure(s) Performed: Procedure(s): COMPUTER ASSISTED TOTAL KNEE ARTHROPLASTY (Right)  Patient Location: PACU  Anesthesia Type:Spinal and MAC combined with regional for post-op pain  Level of Consciousness: awake, alert , oriented and patient cooperative  Airway & Oxygen Therapy: Patient Spontanous Breathing  Post-op Assessment: Report given to RN and Post -op Vital signs reviewed and stable  Post vital signs: Reviewed and stable  Last Vitals:  Vitals:   09/28/16 0557  BP: (!) 172/75  Pulse: 76  Resp: 18  Temp: 36.6 C    Last Pain:  Vitals:   09/28/16 0557  TempSrc: Oral         Complications: No apparent anesthesia complications

## 2016-09-28 NOTE — Anesthesia Postprocedure Evaluation (Signed)
Anesthesia Post Note  Patient: [name]  Procedure(s) Performed: Procedure(s) (LRB): COMPUTER ASSISTED TOTAL KNEE ARTHROPLASTY (Right)  Patient location during evaluation: PACU Anesthesia Type: Spinal Level of consciousness: awake and alert Pain management: pain level controlled Vital Signs Assessment: post-procedure vital signs reviewed and stable Respiratory status: spontaneous breathing and respiratory function stable Cardiovascular status: blood pressure returned to baseline and stable Postop Assessment: spinal receding Anesthetic complications: no       Last Vitals:  Vitals:   09/28/16 1415 09/28/16 1445  BP: 138/63 130/72  Pulse: 67 73  Resp: 12 16  Temp:      Last Pain:  Vitals:   09/28/16 1415  TempSrc:   PainSc: Asleep                 Maeley Matton,W. EDMOND

## 2016-09-28 NOTE — Discharge Instructions (Signed)
°Dr. Markela Wee °Total Joint Specialist °[facility] °[address] °[place], [place] [address] °[phone] ° °TOTAL KNEE REPLACEMENT POSTOPERATIVE DIRECTIONS ° ° ° °Knee Rehabilitation, Guidelines Following Surgery  °Results after knee surgery are often greatly improved when you follow the exercise, range of motion and muscle strengthening exercises prescribed by your doctor. Safety measures are also important to protect the knee from further injury. Any time any of these exercises cause you to have increased pain or swelling in your knee joint, decrease the amount until you are comfortable again and slowly increase them. If you have problems or questions, call your caregiver or physical therapist for advice.  ° °WEIGHT BEARING °Weight bearing as tolerated with assist device (walker, cane, etc) as directed, use it as long as suggested by your surgeon or therapist, typically at least 4-6 weeks. ° °HOME CARE INSTRUCTIONS  °Remove items at home which could result in a fall. This includes throw rugs or furniture in walking pathways.  °Continue medications as instructed at time of discharge. °You may have some home medications which will be placed on hold until you complete the course of blood thinner medication.  °You may start showering once you are discharged home but do not submerge the incision under water. Just pat the incision dry and apply a dry gauze dressing on daily. °Walk with walker as instructed.  °You may resume a sexual relationship in one month or when given the OK by your doctor.  °· Use walker as long as suggested by your caregivers. °· Avoid periods of inactivity such as sitting longer than an hour when not asleep. This helps prevent blood clots.  °You may put full weight on your legs and walk as much as is comfortable.  °You may return to work once you are cleared by your doctor.  °Do not drive a car for 6 weeks or until released by you surgeon.  °· Do not drive while  taking narcotics.  °Wear the elastic stockings for three weeks following surgery during the day but you may remove then at night. °Make sure you keep all of your appointments after your operation with all of your doctors and caregivers. You should call the office at the above phone number and make an appointment for approximately two weeks after the date of your surgery. °Do not remove your surgical dressing. The dressing is waterproof; you may take showers in 3 days, but do not take tub baths or submerge the dressing. °Please pick up a stool softener and laxative for home use as long as you are requiring pain medications. °· ICE to the affected knee every three hours for 30 minutes at a time and then as needed for pain and swelling.  Continue to use ice on the knee for pain and swelling from surgery. You may notice swelling that will progress down to the foot and ankle.  This is normal after surgery.  Elevate the leg when you are not up walking on it.   °It is important for you to complete the blood thinner medication as prescribed by your doctor. °· Continue to use the breathing machine which will help keep your temperature down.  It is common for your temperature to cycle up and down following surgery, especially at night when you are not up moving around and exerting yourself.  The breathing machine keeps your lungs expanded and your temperature down. ° °RANGE OF MOTION AND STRENGTHENING EXERCISES  °Rehabilitation of the knee is important following a   knee injury or an operation. After just a few days of immobilization, the muscles of the thigh which control the knee become weakened and shrink (atrophy). Knee exercises are designed to build up the tone and strength of the thigh muscles and to improve knee motion. Often times heat used for twenty to thirty minutes before working out will loosen up your tissues and help with improving the range of motion but do not use heat for the first two weeks following  surgery. These exercises can be done on a training (exercise) mat, on the floor, on a table or on a bed. Use what ever works the best and is most comfortable for you Knee exercises include:  Leg Lifts - While your knee is still immobilized in a splint or cast, you can do straight leg raises. Lift the leg to 60 degrees, hold for 3 sec, and slowly lower the leg. Repeat 10-20 times 2-3 times daily. Perform this exercise against resistance later as your knee gets better.  Quad and Hamstring Sets - Tighten up the muscle on the front of the thigh (Quad) and hold for 5-10 sec. Repeat this 10-20 times hourly. Hamstring sets are done by pushing the foot backward against an object and holding for 5-10 sec. Repeat as with quad sets.  A rehabilitation program following serious knee injuries can speed recovery and prevent re-injury in the future due to weakened muscles. Contact your doctor or a physical therapist for more information on knee rehabilitation.   SKILLED REHAB INSTRUCTIONS: If the patient is transferred to a skilled rehab facility following release from the hospital, a list of the current medications will be sent to the facility for the patient to continue.  When discharged from the skilled rehab facility, please have the facility set up the patient's Home Health Physical Therapy prior to being released. Also, the skilled facility will be responsible for providing the patient with their medications at time of release from the facility to include their pain medication, the muscle relaxants, and their blood thinner medication. If the patient is still at the rehab facility at time of the two week follow up appointment, the skilled rehab facility will also need to assist the patient in arranging follow up appointment in our office and any transportation needs.  MAKE SURE YOU:  Understand these instructions.  Will watch your condition.  Will get help right away if you are not doing well or get worse.     Pick up stool softner and laxative for home use following surgery while on pain medications. Do NOT remove your dressing. You may shower.  Do not take tub baths or submerge incision under water. Please use a clean towel to pat the incision dry following showers. Continue to use ice for pain and swelling after surgery. Do not use any lotions or creams on the incision until instructed by your surgeon.

## 2016-09-28 NOTE — Anesthesia Procedure Notes (Signed)
Anesthesia Regional Block: Adductor canal block   Pre-Anesthetic Checklist: ,, timeout performed, Correct Patient, Correct Site, Correct Laterality, Correct Procedure, Correct Position, site marked, Risks and benefits discussed, pre-op evaluation,  At surgeon's request and post-op pain management  Laterality: Right  Prep: Maximum Sterile Barrier Precautions used, chloraprep       Needles:  Injection technique: Single-shot  Needle Type: Echogenic Stimulator Needle     Needle Length: 9cm  Needle Gauge: 21     Additional Needles:   Procedures: ultrasound guided,,,,,,,,  Narrative:  Start time: 09/28/2016 7:00 AM End time: 09/28/2016 7:10 AM Injection made incrementally with aspirations every 5 mL. Anesthesiologist: Gaynelle AduFITZGERALD, Kanyia Heaslip  Additional Notes: 2% Lidocaine skin wheel.

## 2016-09-28 NOTE — Discharge Summary (Signed)
Physician Discharge Summary  Patient ID: [name] MRN: [id number] DOB/AGE: April 13, 1951 66 y.o.  Admit date: 09/28/2016 Discharge date: 09/30/2016 Admission Diagnoses:  Osteoarthritis of right knee; HTN; exertional chest pain; hx of abnormal ECG; hx of partial thyroidectomy; hx of SOB; CAD; hyperlipidemia; mild renal insufficiency  Discharge Diagnoses:  Principal Problem:   Osteoarthritis of right knee same as above  Past Medical History:  Diagnosis Date  . Depressive disorder   . Dyslipidemia   . Hypertension   . Nonalcoholic liver disease, chronic   . Osteopenia   . Palpitations     Surgeries: Procedure(s): COMPUTER ASSISTED TOTAL KNEE ARTHROPLASTY on 09/28/2016   Consultants (if any): N/A  Discharged Condition: Improved  Hospital Course: [name] is an 66 y.o. female who was admitted 09/28/2016 with a diagnosis of Osteoarthritis of right knee and went to the operating room on 09/28/2016 and underwent the above named procedures.  She tolerated the procedure well without complication.  She was then admitted to the hospital.  She worked well with PT.  She tolerated her stay well without complication.  She is to be D/C'd home on 09/30/16 with outpatient PT.  She was given perioperative antibiotics:  Anti-infectives    Start     Dose/Rate Route Frequency Ordered Stop   09/28/16 1156  ceFAZolin (ANCEF) 1 GM/50ML IVPB    Comments:  Moring, Taylor   : cabinet override      09/28/16 1156 09/28/16 1158   09/28/16 1115  ceFAZolin (ANCEF) IVPB 1 g/50 mL premix     1 g 100 mL/hr over 30 Minutes Intravenous Every 6 hours 09/28/16 1109 09/28/16 1825   09/28/16 0700  ceFAZolin (ANCEF) IVPB 2g/100 mL premix     2 g 200 mL/hr over 30 Minutes Intravenous To [facility]Stay [facility] 09/25/16 1047 09/28/16 0755    .  She was given sequential compression devices, early ambulation, and ASA for DVT prophylaxis.  She benefited maximally from the hospital stay and there were no  complications.    Recent vital signs:  Vitals:   09/29/16 2258 09/30/16 0408  BP: 124/63 122/70  Pulse: 76 72  Resp: 12 14  Temp: 98.4 F (36.9 C) 98.4 F (36.9 C)    Recent laboratory studies:  Lab Results  Component Value Date   HGB 11.0 (L) 09/29/2016   HGB 13.0 09/18/2016   HGB 13.7 07/26/2014   Lab Results  Component Value Date   WBC 11.4 (H) 09/29/2016   PLT 168 09/29/2016   Lab Results  Component Value Date   INR 1.04 09/18/2016   Lab Results  Component Value Date   NA 139 09/29/2016   K 3.8 09/29/2016   CL 106 09/29/2016   CO2 24 09/29/2016   BUN 13 09/29/2016   CREATININE 1.18 (H) 09/29/2016   GLUCOSE 131 (H) 09/29/2016    Discharge Medications:   Allergies as of 09/30/2016      Reactions   No Known Allergies       Medication List    STOP taking these medications   acetaminophen 500 MG tablet Commonly known as:  TYLENOL     TAKE these medications   amLODipine 5 MG tablet Commonly known as:  NORVASC TAKE 1 TABLET EVERY DAY   ARTIFICIAL TEAR OP Apply 1 drop to eye daily as needed (dry eyes).   aspirin 81 MG tablet Take 1 tablet (81 mg total) by mouth 2 (two) times daily after a meal.   atorvastatin 40 MG tablet Commonly known  as:  LIPITOR TAKE 1 TABLET EVERY DAY (KEEP OFFICE VISIT)   BIOTIN 5000 PO Take 5,000 mcg by mouth daily.   CALCIUM PO Take 1,200 mg by mouth 2 (two) times daily.   CoQ-10 200 MG Caps Take 200 mg by mouth daily.   docusate sodium 100 MG capsule Commonly known as:  COLACE Take 1 capsule (100 mg total) by mouth 2 (two) times daily.   FISH OIL ULTRA 1400 MG Caps Take 1,400 mg by mouth 2 (two) times daily.   GLUCOSAMINE-MSM PO Take 1,500 mg by mouth 2 (two) times daily.   HYDROcodone-acetaminophen 5-325 MG tablet Commonly known as:  NORCO Take 1-2 tablets by mouth every 4 (four) hours as needed for moderate pain.   lisinopril 5 MG tablet Commonly known as:  PRINIVIL,ZESTRIL Take 5 mg by mouth daily.    magnesium gluconate 500 MG tablet Commonly known as:  MAGONATE Take 500 mg by mouth daily.   metoprolol succinate 25 MG 24 hr tablet Commonly known as:  TOPROL XL Take 1 tablet (25 mg total) by mouth daily.   nitroGLYCERIN 0.4 MG SL tablet Commonly known as:  NITROSTAT Place 1 tablet (0.4 mg total) under the tongue every 5 (five) minutes as needed for chest pain.   ondansetron 4 MG tablet Commonly known as:  ZOFRAN Take 1 tablet (4 mg total) by mouth every 8 (eight) hours as needed for nausea or vomiting.   senna 8.6 MG Tabs tablet Commonly known as:  SENOKOT Take 2 tablets (17.2 mg total) by mouth at bedtime.   Turmeric 500 MG Caps Take 500 mg by mouth 3 (three) times daily.   Vitamin D3 5000 units Caps Take 5,000 Units by mouth daily.       Diagnostic Studies: Dg Knee Right Port  Result Date: 09/28/2016 CLINICAL DATA:  Status post right knee replacement today. EXAM: PORTABLE RIGHT KNEE - 1-2 VIEW COMPARISON:  None. FINDINGS: Right total knee arthroplasty is in place. There is some gas in the soft tissues from surgery. Hardware is intact and normally positioned. No fracture. IMPRESSION: Status post right total knee replacement.  No acute finding. Electronically Signed   By: Drusilla Kanner M.D.   On: 09/28/2016 10:30    Disposition: 01-Home or Self Care  Discharge Instructions    Call MD / Call 911    Complete by:  As directed    If you experience chest pain or shortness of breath, CALL 911 and be transported to the hospital emergency room.  If you develope a fever above 101 F, pus (white drainage) or increased drainage or redness at the wound, or calf pain, call your surgeon's office.   Constipation Prevention    Complete by:  As directed    Drink plenty of fluids.  Prune juice may be helpful.  You may use a stool softener, such as Colace (over the counter) 100 mg twice a day.  Use MiraLax (over the counter) for constipation as needed.   Diet - low sodium heart healthy     Complete by:  As directed    Increase activity slowly as tolerated    Complete by:  As directed    Weight bearing as tolerated    Complete by:  As directed    Laterality:  right   Extremity:  Lower      Follow-up Information    Delaney Perona, Cloyde Reams, MD. Schedule an appointment as soon as possible for a visit in 2 week(s).   Specialty:  Orthopedic Surgery Why:  For wound re-check Contact information: [address] [place] [address] [phone]            Signed: Alfredo MartinezJustin Ollis, Cordelia PochePA-C, ATC [facility] Office:  [phone]

## 2016-09-28 NOTE — Op Note (Signed)
OPERATIVE REPORT  SURGEON: Samson FredericBrian Maie Kesinger, MD   ASSISTANT: Hart CarwinJustin Queen, RNFA.  PREOPERATIVE DIAGNOSIS: Right knee arthritis.   POSTOPERATIVE DIAGNOSIS: Right knee arthritis.   PROCEDURE: Right total knee arthroplasty.   IMPLANTS: Stryker Triathlon CR femur, size 3. Stryker Tritanium tibia, size 4. X3 polyethelyene insert, size 9 mm, CS. 3 button asymmetric patella, size 29 mm.  ANESTHESIA:  Regional and Spinal  TOURNIQUET TIME: Not utilized.   ESTIMATED BLOOD LOSS: 250 mL.  ANTIBIOTICS: 2 g Ancef.  DRAINS: None.  COMPLICATIONS: None   CONDITION: PACU - hemodynamically stable.   BRIEF CLINICAL NOTE: Janie Morningancy Wanner is a 66 y.o. female with a long-standing history of Right knee arthritis. After failing conservative management, the patient was indicated for total knee arthroplasty. The risks, benefits, and alternatives to the procedure were explained, and the patient elected to proceed.  PROCEDURE IN DETAIL: Adductor canal block was obtained in the pre-op holding area. Once inside the operative room, spinal anesthesia was obtained, and a foley catheter was inserted. The patient was then positioned, a nonsterile tourniquet was placed, and the lower extremity was prepped and draped in the normal sterile surgical fashion. A time-out was called verifying side and site of surgery. The patient received IV antibiotics within 60 minutes of beginning the procedure. The tourniquet was not utilized.  An anterior approach to the knee was performed utilizing a midvastus arthrotomy. A medial release was performed and the patellar fat pad was excised. Stryker navigation was used to cut the distal femur perpendicular to the mechanical axis. A freehand patellar resection was performed, and the patella was sized an prepared with 3 lug holes.  Nagivation was used to make a neutral proximal tibia  resection, taking 8 mm of bone from the less affected lateral side with 3 degrees of slope. The menisci were excised. A spacer block was placed, and the alignment and balance in extension were confirmed.   The distal femur was sized using the 3-degree external rotation guide referencing the posterior femoral cortex. The appropriate 4-in-1 cutting block was pinned into place. Rotation was checked using Whiteside's line, the epicondylar axis, and then confirmed with a spacer block in flexion. The remaining femoral cuts were performed, taking care to protect the MCL.  The tibia was sized and the trial tray was pinned into place. The remaining trail components were inserted. The knee was stable to varus and valgus stress through a full range of motion. The patella tracked centrally, and the PCL was well balanced. The trial components were removed, and the proximal tibial surface was prepared. Final components were impacted into place. The knee was tested for a final time and found to be well balanced.  The wound was copiously irrigated with a dilute betadine solution followed by normal saline with pulse lavage. Marcaine solution was injected into the periarticular soft tissue. The wound was closed in layers using #1 Vicryl and V-Loc for the fascia, 2-0 Vicryl for the subcutaneous fat, 2-0 Monocryl for the deep dermal layer, 3-0 running Monocryl subcuticular Stitch, and Dermabond for the skin. Once the glue was fully dried, an Aquacell Ag and compressive dressing were applied. Tthe patient was transported to the recovery room in stable condition. Sponge, needle, and instrument counts were correct at the end of the case x2. The patient tolerated the procedure well and there were no known complications.

## 2016-09-28 NOTE — Evaluation (Signed)
Physical Therapy Evaluation Patient Details Name: [name] MRN: [id number] DOB: 16-Feb-1951 Today's Date: 09/28/2016   History of Present Illness  Pt is a 66 yo female with history of end stage R knee OA and is s/p 09/28/16 R TKA PMH is significant for dyslipedemia, HTN, non-alchoholic liver disease and osteopenia.   Clinical Impression  Pt is s/p TKA resulting in the deficits listed below (see PT Problem List). Pt mobility unable to be assessed at evaluation due to nausea with movement of trunk and head. Pt was able to perform bed exercises. Pt will benefit from skilled PT to increase their independence and safety with mobility to allow discharge to the venue listed below.      Follow Up Recommendations Supervision/Assistance - 24 hour;Home health PT    Equipment Recommendations  None recommended by PT    Recommendations for Other Services       Precautions / Restrictions Precautions Precautions: Knee Required Braces or Orthoses: Other Brace/Splint (knee brace ice pack) Restrictions Weight Bearing Restrictions: Yes Other Position/Activity Restrictions: WBAT      Mobility  Bed Mobility               General bed mobility comments: Bed mobility not assessed due to pt nausea with movement of trunk and head  Transfers                 General transfer comment: Transfers not assessed due to pt nausea with movement of her trunk and head              Pertinent Vitals/Pain Pain Assessment: 0-10 Pain Score: 0-No pain  BP 132/55 HR 66 bpm SaO2 on RA 99%    Home Living Family/patient expects to be discharged to:: Private residence Living Arrangements: Spouse/significant other Available Help at Discharge: Family;Available 24 hours/day Type of Home: House Home Access: Stairs to enter Entrance Stairs-Rails: Doctor, general practice of Steps: 3 Home Layout: Two level;Able to live on main level with bedroom/bathroom Home Equipment: Dan Humphreys - 2  wheels;Bedside commode      Prior Function Level of Independence: Independent         Comments: difficulty with making it through entire supermarket due to pain        Extremity/Trunk Assessment   Upper Extremity Assessment Upper Extremity Assessment: Defer to OT evaluation    Lower Extremity Assessment Lower Extremity Assessment: RLE deficits/detail RLE Deficits / Details: Measured in supine R knee ext -8 degrees (limited by bandage) R knee flexion 104 degrees       Communication   Communication: No difficulties  Cognition Arousal/Alertness: Lethargic Behavior During Therapy:  (Groggy and nauseated) Overall Cognitive Status: Within Functional Limits for tasks assessed                         Exercises General Exercises - Lower Extremity Ankle Circles/Pumps: AROM;10 reps;Both;Supine Quad Sets: AROM;Both;10 reps;Supine Gluteal Sets: AROM;10 reps;Supine Heel Slides: AROM;10 reps;Both;Supine   Assessment/Plan    PT Assessment Patient needs continued PT services  PT Problem List Decreased activity tolerance;Decreased mobility       PT Treatment Interventions Gait training;Stair training;Therapeutic activities;Therapeutic exercise;Patient/family education;Functional mobility training;DME instruction;Balance training    PT Goals (Current goals can be found in the Care Plan section)  Acute Rehab PT Goals Patient Stated Goal: feel less nauseated and groggy PT Goal Formulation: With patient Time For Goal Achievement: 10/05/16 Potential to Achieve Goals: Good    Frequency 7X/week   Barriers to  discharge           End of Session   Activity Tolerance: Other (comment) (pt limited by nausea ) Patient left: in bed;with call bell/phone within reach Nurse Communication: Other (comment) (Nursing notified of nausea and medication ordered) PT Visit Diagnosis: Other abnormalities of gait and mobility (R26.89)         Time: [id number] PT Time Calculation  (min) (ACUTE ONLY): 34 min   Charges:   PT Evaluation $PT Eval Low Complexity: 1 Procedure PT Treatments $Therapeutic Exercise: 8-22 mins   PT G Codes:         [name] 09/28/2016, 4:56 PM  [name] PT, DPT Acute Rehabilitation  [phone] Pager [phone]

## 2016-09-29 ENCOUNTER — Encounter (HOSPITAL_COMMUNITY): Payer: Self-pay | Admitting: Orthopedic Surgery

## 2016-09-29 LAB — CBC
HEMATOCRIT: 32.3 % — AB (ref 36.0–46.0)
HEMOGLOBIN: 11 g/dL — AB (ref 12.0–15.0)
MCH: 30 pg (ref 26.0–34.0)
MCHC: 34.1 g/dL (ref 30.0–36.0)
MCV: 88 fL (ref 78.0–100.0)
Platelets: 168 10*3/uL (ref 150–400)
RBC: 3.67 MIL/uL — AB (ref 3.87–5.11)
RDW: 13.2 % (ref 11.5–15.5)
WBC: 11.4 10*3/uL — AB (ref 4.0–10.5)

## 2016-09-29 LAB — BASIC METABOLIC PANEL
ANION GAP: 9 (ref 5–15)
BUN: 13 mg/dL (ref 6–20)
CHLORIDE: 106 mmol/L (ref 101–111)
CO2: 24 mmol/L (ref 22–32)
CREATININE: 1.18 mg/dL — AB (ref 0.44–1.00)
Calcium: 9.1 mg/dL (ref 8.9–10.3)
GFR calc non Af Amer: 47 mL/min — ABNORMAL LOW (ref >60)
GFR, EST AFRICAN AMERICAN: 54 mL/min — AB (ref >60)
Glucose, Bld: 131 mg/dL — ABNORMAL HIGH (ref 65–99)
Potassium: 3.8 mmol/L (ref 3.5–5.1)
SODIUM: 139 mmol/L (ref 135–145)

## 2016-09-29 NOTE — Evaluation (Signed)
Occupational Therapy Evaluation Patient Details Name: [name] Dawood MRN: [id number] DOB: 1950-10-31 Today's Date: 09/29/2016    History of Present Illness Pt is a 66 yo female with history of end stage R knee OA and is s/p 09/28/16 R TKA PMH is significant for dyslipedemia, HTN, non-alchoholic liver disease and osteopenia.    Clinical Impression   Patient is s/p R TKA surgery resulting in functional limitations due to the deficits listed below (see OT problem list). Pt currently requires supervision for LB adls and will benefit from tub transfer demonstration next visit. [facility]  Patient will benefit from skilled OT acutely to increase independence and safety with ADLS to allow discharge home without follow up .     Follow Up Recommendations  No OT follow up    Equipment Recommendations  Tub/shower bench;3 in 1 bedside commode    Recommendations for Other Services       Precautions / Restrictions Precautions Precautions: Knee Restrictions Weight Bearing Restrictions: Yes RLE Weight Bearing: Weight bearing as tolerated      Mobility Bed Mobility               General bed mobility comments: in recliner-- bed is high and requries step stol  Transfers Overall transfer level: Needs assistance Equipment used: Rolling walker (2 wheeled) Transfers: Sit to/from Stand Sit to Stand: Min assist         General transfer comment: vc for hand placement to push up from recliner and to use UE to offweight L LE to adjust to more stable position    Balance Overall balance assessment: Needs assistance Sitting-balance support: Feet supported Sitting balance-Leahy Scale: Good     Standing balance support: Bilateral upper extremity supported Standing balance-Leahy Scale: Fair Standing balance comment: requires RW to maintain standing balance                            ADL Overall ADL's : Needs  assistance/impaired Eating/Feeding: Independent   Grooming: Wash/dry hands;Modified independent   Upper Body Bathing: Independent   Lower Body Bathing: Supervison/ safety Lower Body Bathing Details (indicate cue type and reason): able to reach toes in sitting Upper Body Dressing : Independent   Lower Body Dressing: Supervision/safety                 General ADL Comments: Pt educated on bed mobility with demonstration , tub transfer with bench, positioning of leg in bed using bed sheet as leg lifter, med management with alarm clock, avoid washing directly over incision and clothing options for safety with dogs.  pt educated on RW use with dogs in the home for safety     Vision Baseline Vision/History: Wears glasses Wears Glasses: At all times       Perception     Praxis      Pertinent Vitals/Pain Pain Assessment: Faces Pain Score: 7  Faces Pain Scale: Hurts little more Pain Location: R knee, R ankle Pain Descriptors / Indicators: Sore Pain Intervention(s): Premedicated before session;Repositioned;Monitored during session;Limited activity within patient's tolerance     Hand Dominance Right   Extremity/Trunk Assessment Upper Extremity Assessment Upper Extremity Assessment: Overall WFL for tasks assessed   Lower Extremity Assessment Lower Extremity Assessment: Defer to PT evaluation RLE Deficits / Details: Measured seated in recliner R knee ext -8 degrees  R knee flexion 107 degrees RLE Sensation:  (intact)   Cervical / Trunk Assessment Cervical /  Trunk Assessment: Normal   Communication Communication Communication: No difficulties   Cognition Arousal/Alertness: Awake/alert Behavior During Therapy: WFL for tasks assessed/performed Overall Cognitive Status: Within Functional Limits for tasks assessed                 General Comments: pt no longer nauseated   General Comments       Exercises Exercises: General Lower Extremity     Shoulder  Instructions      Home Living Family/patient expects to be discharged to:: Private residence Living Arrangements: Spouse/significant other Available Help at Discharge: Family;Available 24 hours/day Type of Home: House Home Access: Stairs to enter [employer] of Steps: 3 Entrance Stairs-Rails: Right;Left Home Layout: Two level;Able to live on main level with bedroom/bathroom Alternate Level Stairs-Number of Steps: 13 Alternate Level Stairs-Rails: Left Bathroom Shower/Tub: Tub/shower unit;Curtain   Bathroom Toilet: Standard Bathroom Accessibility: Yes   Home Equipment: Environmental consultant - 2 wheels;Bedside commode   Additional Comments: two small 13 pound dogs in the house that love to sit in her lap. spouse states he can lock them on the porch if needed      Prior Functioning/Environment Level of Independence: Independent        Comments: difficulty with making it through entire supermarket due to pain        OT Problem List: Decreased strength;Decreased activity tolerance;Impaired balance (sitting and/or standing);Decreased safety awareness;Decreased knowledge of use of DME or AE;Decreased knowledge of precautions;Pain      OT Treatment/Interventions: Self-care/ADL training;Therapeutic exercise;DME and/or AE instruction;Therapeutic activities;Patient/family education;Balance training    OT Goals(Current goals can be found in the care plan section) Acute Rehab OT Goals Patient Stated Goal: to go home OT Goal Formulation: With patient Time For Goal Achievement: 10/13/16 Potential to Achieve Goals: Good  OT Frequency: Min 2X/week   Barriers to D/C:            Co-evaluation              End of Session Equipment Utilized During Treatment: Rolling walker Nurse Communication: Mobility status;Precautions  Activity Tolerance: Patient tolerated treatment well Patient left: in chair;with call bell/phone within reach;with family/visitor present  OT Visit Diagnosis:  Unsteadiness on feet (R26.81)                ADL either performed or assessed with clinical judgement  Time: 4098-1191 OT Time Calculation (min): 26 min Charges:  OT General Charges $OT Visit: 1 Procedure OT Evaluation $OT Eval Moderate Complexity: 1 Procedure OT Treatments $Self Care/Home Management : 8-22 mins G-Codes:      Mateo Flow   OTR/L Pager: [phone] Office: [phone] .   Boone Master B 09/29/2016, 3:04 PM

## 2016-09-29 NOTE — Plan of Care (Signed)
Problem: Bowel/Gastric: Goal: Will not experience complications related to bowel motility Outcome: Progressing No bowel issues reported  Problem: Activity: Goal: Will remain free from falls Outcome: Progressing Safety precautions maintained  Problem: Physical Regulation: Goal: Postoperative complications will be avoided or minimized Outcome: Progressing No post op. complication noted   Problem: Pain Management: Goal: Pain level will decrease with appropriate interventions Outcome: Progressing Medicated once for pain with moderate relief

## 2016-09-29 NOTE — Progress Notes (Signed)
Physical Therapy Treatment Patient Details Name: [name] MRN: [id number] DOB: 1951-01-18 Today's Date: 09/29/2016    History of Present Illness Pt is a 66 yo female with history of end stage R knee OA and is s/p 09/28/16 R TKA PMH is significant for dyslipedemia, HTN, non-alchoholic liver disease and osteopenia.     PT Comments    Pt able to ambulate 300 feet with RW and supervision with no LoB, no buckling of her knee. Pt able to ascend/descend 4 stairs with rail on R with supervision. Pt still requires skilled PT for improving LE strength and ROM and endurance to be able to safely return to her home environment.     Follow Up Recommendations  Supervision/Assistance - 24 hour;Home health PT     Equipment Recommendations  None recommended by PT    Recommendations for Other Services       Precautions / Restrictions Precautions Precautions: Knee Precaution Booklet Issued: Yes (comment) (exercises sheet with precautions) Restrictions Weight Bearing Restrictions: Yes RLE Weight Bearing: Weight bearing as tolerated    Mobility  Bed Mobility Overal bed mobility: Modified Independent (utilizes bedrails)             General bed mobility comments: able to get into and out of bed with no assist  Transfers Overall transfer level: Needs assistance Equipment used: Rolling walker (2 wheeled) Transfers: Sit to/from Stand Sit to Stand: Supervision         General transfer comment: pt remembered to push up from the bed to come to upright and keep her R LE out in front of her  Ambulation/Gait Ambulation/Gait assistance: Supervision Ambulation Distance (Feet): 315 Feet (1 x 300, 1 x 15 ) Assistive device: Rolling walker (2 wheeled) Gait Pattern/deviations: Step-through pattern;Decreased step length - right;Decreased step length - left;Decreased weight shift to right;Trunk flexed Gait velocity: decreased Gait velocity interpretation: Below normal speed for  age/gender General Gait Details: slow, steady gait with no LoB, no buckling, pt able to self correct to place entire foot onto ground instead of just her toe. Pt also attempting to flex her R knee with gait as well.   Stairs Stairs: Yes   Stair Management: One rail Right Number of Stairs: 4 General stair comments: pt and husband educated on proper ascent with L LE first and descent with R LE first as well as keeping rail on R side if possible  Wheelchair Mobility    Modified Rankin (Stroke Patients Only)       Balance Overall balance assessment: Needs assistance Sitting-balance support: Feet supported Sitting balance-Leahy Scale: Good     Standing balance support: No upper extremity supported;During functional activity Standing balance-Leahy Scale: Good Standing balance comment: able to wash hands after using bathroom with no UE support and reaching outside BoS                    Cognition Arousal/Alertness: Awake/alert Behavior During Therapy: WFL for tasks assessed/performed Overall Cognitive Status: Within Functional Limits for tasks assessed                         General Comments General comments (skin integrity, edema, etc.): Pt and husband educated on proper technique for getting into and out of her SUV to go home. Pt and husband also educated on the need for using her RW at all times when mobilizing in the room even if she feel like she is doing okay to be safe while on  pain medications and adjusting to R TKA.       Pertinent Vitals/Pain Pain Assessment: 0-10 Pain Score: 6  Faces Pain Scale: Hurts little more Pain Location: R knee  Pain Descriptors / Indicators: Aching;Burning;Sore;Tender;Guarding Pain Intervention(s): Premedicated before session;Ice applied;Limited activity within patient's tolerance    Home Living Family/patient expects to be discharged to:: Private residence Living Arrangements: Spouse/significant other Available Help at  Discharge: Family;Available 24 hours/day Type of Home: House Home Access: Stairs to enter Entrance Stairs-Rails: Right;Left Home Layout: Two level;Able to live on main level with bedroom/bathroom Home Equipment: Dan HumphreysWalker - 2 wheels;Bedside commode Additional Comments: two small 13 pound dogs in the house that love to sit in her lap. spouse states he can lock them on the porch if needed    Prior Function Level of Independence: Independent      Comments: difficulty with making it through entire supermarket due to pain   PT Goals (current goals can now be found in the care plan section) Acute Rehab PT Goals Patient Stated Goal: to go home PT Goal Formulation: With patient Time For Goal Achievement: 10/05/16 Potential to Achieve Goals: Good Progress towards PT goals: Progressing toward goals    Frequency    7X/week      PT Plan Current plan remains appropriate       End of Session Equipment Utilized During Treatment: Gait belt Activity Tolerance: Patient tolerated treatment well Patient left: with call bell/phone within reach;with family/visitor present;in bed   PT Visit Diagnosis: Other abnormalities of gait and mobility (R26.89);Pain Pain - Right/Left: Right Pain - part of body: Knee     Time: [id number] PT Time Calculation (min) (ACUTE ONLY): 35 min  Charges:  $Gait Training: 23-37 mins                    G Codes:       [name] 09/29/2016, 4:41 PM  [name] PT, DPT Acute Rehabilitation  [phone] Pager [phone]

## 2016-09-29 NOTE — Progress Notes (Signed)
Subjective: 1 Day Post-Op Procedure(s) (LRB): COMPUTER ASSISTED TOTAL KNEE ARTHROPLASTY (Right)  Patient reports pain as mild to moderate.  Patient reports that she struggled with Nausea last night, however that is started to subside. Denies BM, however admits to flatulence.  Denies fever, chills, V.  Objective:   VITALS:  Temp:  [97.6 F (36.4 C)-99 F (37.2 C)] 98.9 F (37.2 C) (03/06 0353) Pulse Rate:  [65-111] 111 (03/06 0353) Resp:  [10-19] 12 (03/06 0353) BP: (125-141)/(52-83) 130/75 (03/06 0353) SpO2:  [98 %-100 %] 100 % (03/06 0353)  General: WDWN patient in NAD. Psych:  Appropriate mood and affect. Neuro:  A&O x 3, Moving all extremities, sensation intact to light touch HEENT:  EOMs intact Chest:  Even non-labored respirations Skin:  Incision C/D/I, no rashes or lesions Extremities: warm/dry, mild edema, no erythema or echymosis.  No lymphadenopathy. Pulses: Popliteus 2+ MSK:  ROM: lacks 5 degrees of TKE, MMT: patient is able to perform quad set, (-) Homan's    LABS  Recent Labs  09/29/16 0545  HGB 11.0*  WBC 11.4*  PLT 168    Recent Labs  09/29/16 0545  NA 139  K 3.8  CL 106  CO2 24  BUN 13  CREATININE 1.18*  GLUCOSE 131*   No results for input(s): LABPT, INR in the last 72 hours.   Assessment/Plan: 1 Day Post-Op Procedure(s) (LRB): COMPUTER ASSISTED TOTAL KNEE ARTHROPLASTY (Right)  Up with therapy WBAT R LE ACE bandage removed, compression stocking applied Plan for D/C home tomorrow Outpatient PT is set up ASA for DVT prophylaxis.  Alfredo MartinezJustin Yafet Cline, PA-C, [facility] Office:  [phone]

## 2016-09-29 NOTE — Progress Notes (Signed)
Physical Therapy Treatment Patient Details Name: [name] MRN: [id number] DOB: [birth year] Today's Date: 09/29/2016    History of Present Illness Pt is a 66 yo female with history of end stage R knee OA and is s/p 09/28/16 R TKA PMH is significant for dyslipedemia, HTN, non-alchoholic liver disease and osteopenia.     PT Comments    Pt feeling much better today. Pt able to sit<>stand with min Ax1 with RW and ambulate 75 feet with min Ax1 and RW with no buckling, no LoB. Pt requires continued skilled PT for transfer, gait and stair training as well as for improving strength and endurance to safely return to her home environment.   Follow Up Recommendations  Supervision/Assistance - 24 hour;Home health PT     Equipment Recommendations  None recommended by PT    Recommendations for Other Services       Precautions / Restrictions Precautions Precautions: Knee Restrictions Weight Bearing Restrictions: Yes RLE Weight Bearing: Weight bearing as tolerated    Mobility  Bed Mobility               General bed mobility comments: Pt seated in recliner at entry  Transfers Overall transfer level: Needs assistance Equipment used: Rolling walker (2 wheeled) Transfers: Sit to/from Stand Sit to Stand: Min assist         General transfer comment: vc for hand placement to push up from recliner and to use UE to offweight L LE to adjust to more stable position  Ambulation/Gait Ambulation/Gait assistance: Min assist Ambulation Distance (Feet): 75 Feet Assistive device: Rolling walker (2 wheeled) Gait Pattern/deviations: Step-through pattern;Decreased step length - right;Decreased step length - left;Decreased weight shift to right;Antalgic;Trunk flexed Gait velocity: decreased Gait velocity interpretation: Below normal speed for age/gender General Gait Details: slow, steady gait with no LoB, no buckling, vc to relax shoulders after pushing through them to advance L LE  as pt  with c/o UE fatigue          Balance Overall balance assessment: Needs assistance Sitting-balance support: Feet supported Sitting balance-Leahy Scale: Good     Standing balance support: Bilateral upper extremity supported Standing balance-Leahy Scale: Fair Standing balance comment: requires RW to maintain standing balance                    Cognition Arousal/Alertness: Lethargic;Awake/alert   Overall Cognitive Status: Within Functional Limits for tasks assessed                 General Comments: pt no longer nauseated    Exercises General Exercises - Lower Extremity Ankle Circles/Pumps: AROM;10 reps;Both;Seated Quad Sets: AROM;Both;10 reps;Seated Gluteal Sets: AROM;10 reps;Seated Long Arc Quad: AROM;Both;10 reps;Seated Heel Slides: AROM;10 reps;Both;Seated        Pertinent Vitals/Pain Pain Assessment: 0-10 Pain Score: 7  Pain Location: R knee, R ankle Pain Descriptors / Indicators: Aching;Burning;Sore;Tender Pain Intervention(s): Premedicated before session;Ice applied;Monitored during session  VSS    Home Living Family/patient expects to be discharged to:: Private residence Living Arrangements: Spouse/significant other Available Help at Discharge: Family;Available 24 hours/day Type of Home: House Home Access: Stairs to enter Entrance Stairs-Rails: Right;Left Home Layout: Two level;Able to live on main level with bedroom/bathroom Home Equipment: Dan HumphreysWalker - 2 wheels;Bedside commode      Prior Function Level of Independence: Independent      Comments: difficulty with making it through entire supermarket due to pain   PT Goals (current goals can now be found in the care plan section) Acute Rehab  PT Goals Patient Stated Goal: to go home PT Goal Formulation: With patient Time For Goal Achievement: 10/05/16 Potential to Achieve Goals: Good    Frequency    7X/week       End of Session Equipment Utilized During Treatment: Gait  belt Activity Tolerance: Patient tolerated treatment well Patient left: with call bell/phone within reach;in chair;with family/visitor present   PT Visit Diagnosis: Other abnormalities of gait and mobility (R26.89);Pain Pain - Right/Left: Right Pain - part of body: Knee;Ankle and joints of foot     Time: 1023-1050 PT Time Calculation (min) (ACUTE ONLY): 27 min  Charges:  $Gait Training: 8-22 mins $Therapeutic Exercise: 8-22 mins                    G Codes:       Elon Alas Fleet 09/29/2016, 12:25 PM  Courtney Paris. Beverely Risen PT, DPT Acute Rehabilitation  [phone] Pager [phone]

## 2016-09-30 LAB — CBC
HEMATOCRIT: 32.4 % — AB (ref 36.0–46.0)
Hemoglobin: 10.6 g/dL — ABNORMAL LOW (ref 12.0–15.0)
MCH: 29.2 pg (ref 26.0–34.0)
MCHC: 32.7 g/dL (ref 30.0–36.0)
MCV: 89.3 fL (ref 78.0–100.0)
PLATELETS: 170 10*3/uL (ref 150–400)
RBC: 3.63 MIL/uL — ABNORMAL LOW (ref 3.87–5.11)
RDW: 13.8 % (ref 11.5–15.5)
WBC: 14.1 10*3/uL — AB (ref 4.0–10.5)

## 2016-09-30 NOTE — Care Management Note (Signed)
Case Management Note  Patient Details  Name: Janie Morningancy Micale MRN: [id number] Date of Birth: 07-30-1950  Subjective/Objective:  66 yr old old female s/p right total knee arthroplasty.              Action/Plan: Case manager spoke with patient and her husband concerning discharge plan and DME needs. [facility]. Patient asked about getting 3in1 and tub bench, she is aware that these items are not covered by her insurance and is agreeable to paying for them. CM has requested DME from [facility].    Expected Discharge Date:  09/30/16               Expected Discharge Plan:     In-House Referral:  NA  Discharge planning Services  CM Consult  Post Acute Care Choice:  NA, Durable Medical Equipment Choice offered to:  Patient, Spouse  DME Arranged:  3-N-1, Tub bench, CPM DME Agency:  [facility]  HH Arranged:    HH Agency:  NA ([facility])  Status of Service:  Completed, signed off  If discussed at Long Length of Stay Meetings, dates discussed:    Additional Comments:  Durenda GuthrieBrady, Torey Regan Naomi, RN 09/30/2016, 11:15 AM

## 2016-09-30 NOTE — Progress Notes (Signed)
Discharge instructions reviewed with patient.  These included the following:  Medication instructions, f/u appointment, when to call the MD, incision care instructions, pain management, smoking cessation recommendations, activity instructions, and infection control.

## 2016-09-30 NOTE — Progress Notes (Signed)
Occupational Therapy Treatment Patient Details Name: [name] MRN: [id number] DOB: [birth year] Today's Date: 09/30/2016    History of present illness Pt is a 66 yo female with history of end stage R knee OA and is s/p 09/28/16 R TKA PMH is significant for dyslipedemia, HTN, non-alchoholic liver disease and osteopenia.    OT comments  All education is complete and patient indicates understanding. Pt met all goals and adequate level for dlc home with spouse. Pt pending dme arrival prior to d/c.   Follow Up Recommendations  No OT follow up    Equipment Recommendations  Tub/shower bench;3 in 1 bedside commode    Recommendations for Other Services      Precautions / Restrictions Precautions Precautions: Knee Precaution Booklet Issued: Yes (comment) (exercises sheet with precautions) Restrictions Weight Bearing Restrictions: Yes RLE Weight Bearing: Weight bearing as tolerated       Mobility Bed Mobility Overal bed mobility: Independent             General bed mobility comments: able to get into and out of bed with no assist  Transfers Overall transfer level: Modified independent Equipment used: Rolling walker (2 wheeled) Transfers: Sit to/from Stand Sit to Stand: Modified independent (Device/Increase time)         General transfer comment: pt used proper technique to come up from seated position    Balance Overall balance assessment: Modified Independent Sitting-balance support: Feet supported Sitting balance-Leahy Scale: Good     Standing balance support: No upper extremity supported;During functional activity Standing balance-Leahy Scale: Good Standing balance comment: able to stand  in walker and remove ice bag from her leg                   ADL Overall ADL's : Modified independent                                       General ADL Comments: pt completed full adl dressing for home and simulated tub bench transfer during session.  pt with spouse present. pt able to do all without (A) or verbal cues based on education from evaluation   Pt educated on bathing and avoid washing directly on incision. Pt educated to use new wash cloth and towel each day. Pt educated to allow water to run across dressing and not to soak in a tub at this time. Pt advised RN will instruct on any bandages required otherwise is open to air.      Vision                     Perception     Praxis      Cognition   Behavior During Therapy: WFL for tasks assessed/performed Overall Cognitive Status: Within Functional Limits for tasks assessed                         Exercises Total Joint Exercises Long Arc Quad: AROM;10 reps;Right;Seated Knee Flexion: AROM;10 reps;Seated;Right General Exercises - Lower Extremity Long Arc Quad: AROM;Right;10 reps;Seated Heel Slides: AROM;Right;10 reps;Seated Hip ABduction/ADduction: AROM;10 reps;Supine;Right Straight Leg Raises: AROM;10 reps;Right;Supine   Shoulder Instructions       General Comments      Pertinent Vitals/ Pain       Pain Assessment: Faces Pain Score: 4  Faces Pain Scale: Hurts little more Pain Location: R knee  Pain Descriptors /  Indicators: Sore Pain Intervention(s): Monitored during session;Repositioned;Premedicated before session  Home Living                                          Prior Functioning/Environment              Frequency  Min 2X/week        Progress Toward Goals  OT Goals(current goals can now be found in the care plan section)  Progress towards OT goals: Goals met/education completed, patient discharged from OT  Acute Rehab OT Goals Patient Stated Goal: to go home OT Goal Formulation: With patient Time For Goal Achievement: 10/13/16 Potential to Achieve Goals: Good ADL Goals Pt Will Perform Tub/Shower Transfer: Tub transfer;with supervision;tub bench;rolling walker;ambulating (met)  Plan All goals met and  education completed, patient discharged from OT services    Co-evaluation                 End of Session Equipment Utilized During Treatment: Rolling walker  OT Visit Diagnosis: Unsteadiness on feet (R26.81)   Activity Tolerance Patient tolerated treatment well   Patient Left in chair;with call bell/phone within reach;with family/visitor present   Nurse Communication Mobility status;Precautions        Time: 2897-9150 OT Time Calculation (min): 17 min  Charges: OT General Charges $OT Visit: 1 Procedure OT Treatments $Self Care/Home Management : 8-22 mins   Jeri Modena   OTR/L Pager: [phone] Office: [phone] .    Parke Poisson B 09/30/2016, 2:09 PM

## 2016-09-30 NOTE — Progress Notes (Signed)
Subjective: 2 Days Post-Op Procedure(s) (LRB): COMPUTER ASSISTED TOTAL KNEE ARTHROPLASTY (Right)  Patient reports pain as mild to moderate.  Denies fever, chills, N/V.  Reports that her pain is well controlled and that she slept well last night.  Tolerating POs well.  Admits to flatulence.  Objective:   VITALS:  Temp:  [98.4 F (36.9 C)] 98.4 F (36.9 C) (03/07 0408) Pulse Rate:  [72-105] 72 (03/07 0408) Resp:  [12-14] 14 (03/07 0408) BP: (122-155)/(55-70) 122/70 (03/07 0408) SpO2:  [96 %-99 %] 96 % (03/07 0408)  General: WDWN patient in NAD. Psych:  Appropriate mood and affect. Neuro:  A&O x 3, Moving all extremities, sensation intact to light touch HEENT:  EOMs intact Chest:  Even non-labored respirations Skin:  Dressing C/D/I, no rashes or lesions Extremities: warm/dry, mild edema, no erythema or echymosis.  No lymphadenopathy. Pulses: Popliteus 2+ MSK:  ROM: lacks 5 degrees of TKE, MMT: patient is able to perform quad set, (-) Homan's    LABS  Recent Labs  09/29/16 0545  HGB 11.0*  WBC 11.4*  PLT 168    Recent Labs  09/29/16 0545  NA 139  K 3.8  CL 106  CO2 24  BUN 13  CREATININE 1.18*  GLUCOSE 131*   No results for input(s): LABPT, INR in the last 72 hours.   Assessment/Plan: 2 Days Post-Op Procedure(s) (LRB): COMPUTER ASSISTED TOTAL KNEE ARTHROPLASTY (Right)  D/C home today Scripts on chart WBAT R LE ASA for DVT prophylaxis Plan for 2 week outpatient post-op visit with Dr. Noralee CharsSwinteck  Sarah Lopes, PA-C, AT[facility] Office:  [phone]

## 2016-09-30 NOTE — Progress Notes (Signed)
Physical Therapy Treatment Patient Details Name: [name] MRN: [id number] DOB: 1950/11/03 Today's Date: 09/30/2016    History of Present Illness Pt is a 66 yo female with history of end stage R knee OA and is s/p 09/28/16 R TKA PMH is significant for dyslipedemia, HTN, non-alchoholic liver disease and osteopenia.     PT Comments    Pt is moving with more fluidity and increased speed. Pt is modified independent with bed mobility, transfers and gait of 600' with RW with good safety awareness. Pt requires skilled PT for increasing ROM and strength to regain R knee mobility to safely return to her home environment.    Follow Up Recommendations  Supervision/Assistance - 24 hour;Home health PT     Equipment Recommendations  None recommended by PT    Recommendations for Other Services       Precautions / Restrictions Precautions Precautions: Knee Precaution Booklet Issued: Yes (comment) (exercises sheet with precautions) Restrictions Weight Bearing Restrictions: Yes RLE Weight Bearing: Weight bearing as tolerated    Mobility  Bed Mobility Overal bed mobility: Modified Independent (utilizes bedrails)             General bed mobility comments: able to get into and out of bed with no assist  Transfers Overall transfer level: Modified independent Equipment used: Rolling walker (2 wheeled) Transfers: Sit to/from Stand Sit to Stand: Modified independent (Device/Increase time)         General transfer comment: pt used proper technique to come up from seated position  Ambulation/Gait Ambulation/Gait assistance: Modified independent (Device/Increase time) Ambulation Distance (Feet): 600 Feet Assistive device: Rolling walker (2 wheeled) Gait Pattern/deviations: Step-through pattern;Decreased step length - right;Decreased step length - left;Trunk flexed Gait velocity: decreased Gait velocity interpretation: Below normal speed for age/gender General Gait Details: slow,  steady gait with no LoB, no buckling, pt with full available extension and increased flexion in gait    Stairs            Wheelchair Mobility    Modified Rankin (Stroke Patients Only)       Balance Overall balance assessment: Modified Independent Sitting-balance support: Feet supported Sitting balance-Leahy Scale: Good     Standing balance support: No upper extremity supported;During functional activity Standing balance-Leahy Scale: Good Standing balance comment: able to use cell phone with both hands while standing inside the RW at side of bed with no LoB                    Cognition Arousal/Alertness: Awake/alert Behavior During Therapy: WFL for tasks assessed/performed Overall Cognitive Status: Within Functional Limits for tasks assessed                      Exercises General Exercises - Lower Extremity Long Arc Quad: AROM;Right;10 reps;Seated Heel Slides: AROM;Right;10 reps;Seated Hip ABduction/ADduction: AROM;10 reps;Supine;Right Straight Leg Raises: AROM;10 reps;Right;Supine    General Comments General comments (skin integrity, edema, etc.): Pt educated on pain management and ice application to aid in pain reduction as well as how to use the game ready that the physician has given them for home use.      Pertinent Vitals/Pain Pain Assessment: Faces Faces Pain Scale: Hurts little more Pain Location: R knee  Pain Descriptors / Indicators: Aching;Burning;Sore;Tender;Guarding Pain Intervention(s): Premedicated before session;Ice applied;Monitored during session  VSS           PT Goals (current goals can now be found in the care plan section) Acute Rehab PT Goals Patient Stated  Goal: to go home PT Goal Formulation: With patient Time For Goal Achievement: 10/05/16 Potential to Achieve Goals: Good    Frequency    7X/week      PT Plan Current plan remains appropriate       End of Session Equipment Utilized During Treatment: Gait  belt Activity Tolerance: Patient tolerated treatment well Patient left: with call bell/phone within reach;in bed   PT Visit Diagnosis: Other abnormalities of gait and mobility (R26.89);Pain Pain - Right/Left: Right Pain - part of body: Knee     Time: 0825-0901 PT Time Calculation (min) (ACUTE ONLY): 36 min  Charges:  $Gait Training: 8-22 mins $Therapeutic Exercise: 8-22 mins                    G Codes:       Elon Alaslizabeth B Van Fleet 09/30/2016, 9:11 AM  Courtney ParisElizabeth B. Beverely RisenVan Fleet PT, DPT Acute Rehabilitation  [phone] Pager [phone]) [phone]

## 2016-09-30 NOTE — Progress Notes (Signed)
Physical Therapy Discharge Patient Details Name: [name] MRN: [id number] DOB: January 31, 1951 Today's Date: 09/30/2016 Time: 5188-4166 PT Time Calculation (min) (ACUTE ONLY): 25 min  Patient discharged from PT services secondary to goals met and no further PT needs identified.  Please see latest therapy progress note for current level of functioning and progress toward goals.    Progress and discharge plan discussed with patient and/or caregiver: Patient/Caregiver agrees with plan  GP     [place] 09/30/2016, 2:00 PM  Dani Gobble. Migdalia Dk PT, DPT Acute Rehabilitation  [phone] Pager [phone]

## 2016-09-30 NOTE — Progress Notes (Signed)
Physical Therapy Treatment Patient Details Name: [name] MRN: [id number] DOB: [birth year] Today's Date: 09/30/2016    History of Present Illness Pt is a 66 yo female with history of end stage R knee OA and is s/p 09/28/16 R TKA PMH is significant for dyslipedemia, HTN, non-alchoholic liver disease and osteopenia.     PT Comments    Pt has met her goals and has had her mobility questions answered and is suitable for discharge.    Follow Up Recommendations  Supervision/Assistance - 24 hour;Home health PT     Equipment Recommendations  None recommended by PT    Recommendations for Other Services       Precautions / Restrictions Precautions Precautions: Knee Precaution Booklet Issued: Yes (comment) (exercises sheet with precautions) Restrictions Weight Bearing Restrictions: Yes RLE Weight Bearing: Weight bearing as tolerated    Mobility  Bed Mobility Overal bed mobility: Independent (utilizes bedrails)             General bed mobility comments: able to get into and out of bed with no assist  Transfers Overall transfer level: Modified independent Equipment used: Rolling walker (2 wheeled) Transfers: Sit to/from Stand Sit to Stand: Modified independent (Device/Increase time)         General transfer comment: pt used proper technique to come up from seated position  Ambulation/Gait Ambulation/Gait assistance: Modified independent (Device/Increase time) Ambulation Distance (Feet): 600 Feet Assistive device: Rolling walker (2 wheeled) Gait Pattern/deviations: Step-through pattern;Decreased step length - right;Decreased step length - left;Trunk flexed Gait velocity: decreased Gait velocity interpretation: Below normal speed for age/gender General Gait Details: pt with greater fluidity of gait, cadance has increased since this morning and pt continues to be safe with no LoB or knee buckling       Balance Overall balance assessment: Modified  Independent Sitting-balance support: Feet supported Sitting balance-Leahy Scale: Good     Standing balance support: No upper extremity supported;During functional activity Standing balance-Leahy Scale: Good Standing balance comment: able to stand  in walker and remove ice bag from her leg                    Cognition Arousal/Alertness: Awake/alert Behavior During Therapy: WFL for tasks assessed/performed Overall Cognitive Status: Within Functional Limits for tasks assessed                      Exercises Total Joint Exercises Long Arc Quad: AROM;10 reps;Right;Seated Knee Flexion: AROM;10 reps;Seated;Right General Exercises - Lower Extremity Long Arc Quad: AROM;Right;10 reps;Seated Heel Slides: AROM;Right;10 reps;Seated Hip ABduction/ADduction: AROM;10 reps;Supine;Right Straight Leg Raises: AROM;10 reps;Right;Supine    General Comments General comments (skin integrity, edema, etc.): Pt and husband with continued education on getting into the SUV for transportation and if there are hand holds in the back seat it may be easier to get into the back than the front seat.       Pertinent Vitals/Pain Pain Assessment: 0-10 Pain Score: 4  Pain Location: R knee  Pain Descriptors / Indicators: Aching;Burning;Sore;Tender;Guarding Pain Intervention(s): Monitored during session;Ice applied  VSS           PT Goals (current goals can now be found in the care plan section) Acute Rehab PT Goals Patient Stated Goal: to go home PT Goal Formulation: With patient Time For Goal Achievement: 10/05/16 Potential to Achieve Goals: Good Progress towards PT goals: Goals met/education completed, patient discharged from PT    Frequency    7X/week      PT  Plan Current plan remains appropriate       End of Session Equipment Utilized During Treatment: Gait belt Activity Tolerance: Patient tolerated treatment well Patient left: with call bell/phone within reach;in bed;with  family/visitor present   PT Visit Diagnosis: Other abnormalities of gait and mobility (R26.89);Pain Pain - Right/Left: Right Pain - part of body: Knee     Time: 3329-5188 PT Time Calculation (min) (ACUTE ONLY): 25 min  Charges:  $Gait Training: 8-22 mins $Therapeutic Exercise: 8-22 mins                    G Codes:       Bailey Mech Fleet 09/30/2016, 1:57 PM  Dani Gobble. Migdalia Dk PT, DPT Acute Rehabilitation  [phone] Pager [phone]

## 2016-10-16 ENCOUNTER — Other Ambulatory Visit: Payer: Self-pay | Admitting: Cardiovascular Disease

## 2016-11-13 ENCOUNTER — Other Ambulatory Visit: Payer: Self-pay | Admitting: Cardiovascular Disease

## 2016-12-12 LAB — POCT GLUCOSE (2 HR PP): Glucose 2 Hr PP, POC: 124 mg/dL

## 2016-12-16 ENCOUNTER — Telehealth: Payer: Self-pay | Admitting: Cardiovascular Disease

## 2016-12-16 NOTE — Telephone Encounter (Signed)
Per pt has stopped taking METOPROLOL today  And started back. bisoprolol/hctz  5/6.25

## 2016-12-16 NOTE — Telephone Encounter (Signed)
Returned call to patient. She states she changed back to bisoprolol-hctz5/6.25 mg instead of metoprolol succinate 25mg  (was changed in Jan by Dr. Tresa EndoKelly). She states she asked her PCP if she could go back to bisoprolol-hctz and was he OK with this is Dr. Tresa EndoKelly was OK with this. She states she did not feel good on Toprol and there were "so many side effects listed" and was "leary" about taking it. She states she had knee surgery and swelling and thinks the hctz will help with this.   Will defer to MD to make sure change is OK

## 2016-12-17 NOTE — Telephone Encounter (Signed)
Follow up     Pt said she will stay on the metoprolol until she sees Dr Tresa EndoKelly and she is not going to take the other medication . Can you get her an earlier appt

## 2016-12-18 NOTE — Telephone Encounter (Signed)
Returned call to patient. Scheduled to see Dr. Tresa EndoKelly 12/23/16 @ 0800.

## 2016-12-23 ENCOUNTER — Ambulatory Visit (INDEPENDENT_AMBULATORY_CARE_PROVIDER_SITE_OTHER): Payer: Medicare Other | Admitting: Cardiovascular Disease

## 2016-12-23 ENCOUNTER — Encounter: Payer: Self-pay | Admitting: Cardiovascular Disease

## 2016-12-23 VITALS — BP 140/60 | HR 97 | Ht 62.0 in | Wt 129.2 lb

## 2016-12-23 DIAGNOSIS — R002 Palpitations: Secondary | ICD-10-CM

## 2016-12-23 DIAGNOSIS — I10 Essential (primary) hypertension: Secondary | ICD-10-CM

## 2016-12-23 DIAGNOSIS — I2510 Atherosclerotic heart disease of native coronary artery without angina pectoris: Secondary | ICD-10-CM | POA: Diagnosis not present

## 2016-12-23 DIAGNOSIS — E785 Hyperlipidemia, unspecified: Secondary | ICD-10-CM | POA: Diagnosis not present

## 2016-12-23 DIAGNOSIS — I639 Cerebral infarction, unspecified: Secondary | ICD-10-CM | POA: Diagnosis not present

## 2016-12-23 MED ORDER — METOPROLOL SUCCINATE ER 50 MG PO TB24: 50.0000 mg | ORAL_TABLET | Freq: Every day | ORAL | 3 refills | Status: DC

## 2016-12-23 NOTE — Progress Notes (Addendum)
Patient ID: [name], female   DOB: 17-May-1951, 66 y.o.   MRN: [id number]    HPI:  [name] is a 66 y.o. female who is followed by Dr. Ginette Otto in [place], [place].  Her relative, [name], is one of my patients.  She presents to the office today for 6 month follow-up cardiology evaluation.   [name] has a history of hypertension dating back for at least 2002, and  recently  has been maintained on lisinopril 10 mg and Ziac 5/6.25 mg daily.  She has experienced episodic chest pressure intermittently and had initially undergone a stress test a  7 years ago.  She also has a history of depression. 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 She had noticed increasing palpitations and also  developed episodes of chest tightness last year. She underwent a nuclear perfusion study in July 2015 She was felt to have a small fixed anteroseptal defect without ischemia.  Ejection fraction was 39%, but was felt that this may not be accurate due to many PVCs during the study.  She subsequently underwent a 2-D echo Doppler study in October 2015 in [place] , which demonstrated an ejection fraction at 55-60%.  There was mild left ventricular posterior wall thickness.  Check equivocal tissue Doppler assessment.  The peak pressure gradient across her aortic valve was 7.6 mm with a mean pressure gradient of 4.3 mm without evidence for significant aortic stenosis.  The patient states that recently she has noticed more chest tightness and this seems to occur with only walking halfway down her driveway.    When I initially saw her,  I added amlodipine 5 mg to help both with blood pressure control as well as potential anti-ischemic benefit.   Due to recurrent symptoms, she underwent a catheterization on 08/03/2014 which revealed smooth 40-50% proximal LAD stenosis after the takeoff of the first diagonal vessel which did not significant normalize following intracoronary nitroglycerin  administration.  Otherwise her coronary anatomy was normal.  She had normal LV function without wall motion abnormalities and evidence for very mild  mitral valve prolapse without significant mitral regurgitation.  She was started on statin therapy with her LDL of 113 attempt to induce plaque stability and regression.  When I last saw her, she was feeling well.  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  She denies any recurrent episodes of chest pain or tightness.  She denies presyncope or syncope.  She was told of having mild osteopenia on bone density imaging.  She underwent a CBC, and lipid study by her primary physician.  Her total cholesterol was 142, HDL 43, LDL 83, and triglycerides 76.  Hemoglobin and hematocrit were 13.2 and 39.1.  She denies any palpitations.She denies any recurrent chest pain.  She does note less shortness of breath.  She is on amlodipine 5 mg Ziac 5/6.25 mg, lisinopril 10 mg daily.  She is now on an increased atorvastatin dose at 40 mg daily.  Lab work from one year ago on 11/08/2014 showed a total cholesterol 236, LDL 163, triglycerides 124.  Her lipid studies have improved under increased atorvastatin dose.    When I last saw  She had noticed an occasional pause when she takes her pulse.  She denies any sensation of tachycardia.  She denies presyncope.  She denies syncope.  There are no episodes of chest pain.  She was  need for right knee surgery to be done by Dr. Delfino Lovett.  She underwent her knee surgery  vessel by Dr. Delfino Lovett on 09/28/2016.  She had developed some swelling initially post procedure.  She was doing well but on 12/11/2016, she developed right arm, hand weakness, nausea and vomiting.  She was hospitalized in [place] and was diagnosed as having a stroke.  Apparently, during that evaluation, she underwent CT imaging, MRI, carotid duplex imaging and appears that she may have had an echo bubble study.  I'm waiting for the records to arrive from [facility] for my review.  Since her discharge, she has noticed significant improvement in her neurologic capacity.  According to her description, it sounds as though she was significant dysarthric and may have had a cerebellar infarct in the etiology of her presentation.  She was discharged on baby aspirin 81 mg in addition to her lisinopril 5 mg, Toprol XL 25 mg, atorvastatin 40 mg, amlodipine 5 mg.  She denies any chest pain.  She is unaware of any knowledge of atrial fibrillation.  She has noticed her pulse rate be elevated in the 90s to 100 range.  She presents for cardiology evaluation.  Past medical history is notable for hypertension, palpitations, depressive disorder, osteoarthritis.  Past surgical history is notable for partial thyroidectomy while she was in eighth grade, and recent right knee surgery   Current Outpatient Prescriptions  Medication Sig Dispense Refill  . amLODipine (NORVASC) 5 MG tablet TAKE 1 TABLET EVERY DAY 90 tablet 3  . ARTIFICIAL TEAR OP Apply 1 drop to eye daily as needed (dry eyes).    [name] aspirin 81 MG tablet Take 1 tablet (81 mg total) by mouth 2 (two) times daily after a meal. 60 tablet 1  . atorvastatin (LIPITOR) 40 MG tablet TAKE 1 TABLET EVERY DAY (KEEP OFFICE VISIT) 90 tablet 3  . CALCIUM PO Take 1,200 mg by mouth 2 (two) times daily.    . Cholecalciferol (VITAMIN D3) 5000 units CAPS Take 5,000 Units by mouth daily.    . Coenzyme Q10 (COQ-10) 200 MG CAPS Take 200 mg by mouth daily.    . Glucosamine HCl-MSM (GLUCOSAMINE-MSM PO) Take 1,500 mg by mouth 2 (two) times daily.    [name] HYDROcodone-acetaminophen (NORCO) 5-325 MG tablet Take 1-2 tablets by mouth every 4 (four) hours as needed for moderate pain. 90 tablet 0  . lisinopril (PRINIVIL,ZESTRIL) 5 MG tablet TAKE 1 TABLET (5 MG TOTAL) BY MOUTH DAILY. 90 tablet 3  . metoprolol succinate (TOPROL XL) 50 MG 24 hr tablet Take 1 tablet (50 mg total) by mouth daily. 90 tablet 3  . nitroGLYCERIN (NITROSTAT) 0.4 MG SL  tablet Place 1 tablet (0.4 mg total) under the tongue every 5 (five) minutes as needed for chest pain. 25 tablet 3  . Omega-3 Fatty Acids (FISH OIL ULTRA) 1400 MG CAPS Take 1,400 mg by mouth 2 (two) times daily.    . ondansetron (ZOFRAN) 4 MG tablet Take 1 tablet (4 mg total) by mouth every 8 (eight) hours as needed for nausea or vomiting. 20 tablet 0  . senna (SENOKOT) 8.6 MG TABS tablet Take 2 tablets (17.2 mg total) by mouth at bedtime. 60 each 3  . Turmeric 500 MG CAPS Take 500 mg by mouth 3 (three) times daily.     No current facility-administered medications for this visit.    Social history is notable in that she is married for 39 years.  She does not have any children.  She lives with her husband.  She completed 12th grade of education.  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  There is no tobacco history or alcohol use.  She does care for many dogs.  Family History  Problem Relation Age of Onset  . Heart failure Mother   . Sudden death Father   . COPD Father   . Cancer Brother   . Cancer Sister    Family history is notable that her mother died at age 48 with possible congestive heart failure.  Her father died at 96 with sudden death.  He had COPD.  A brother died at 77 with cancer and a sister died at 25 with cancer.   ROS General: Negative; No fevers, chills, or night sweats HEENT: Negative; No changes in vision or hearing, sinus congestion, difficulty swallowing Pulmonary: Negative; No cough, wheezing, shortness of breath, hemoptysis Cardiovascular:  See HPI;  GI: Negative; No nausea, vomiting, diarrhea, or abdominal pain GU: Negative; No dysuria, hematuria, or difficulty voiding Musculoskeletal: Positive for arthritis in her right knee Hematologic/Oncologic: Negative; no easy bruising, bleeding Endocrine: History of partial thyroidectomy Neuro: Negative; no changes in balance, headaches Skin: Negative; No rashes or skin  lesions Psychiatric: Prior history of depression, currently stable Sleep: Negative; No daytime sleepiness, hypersomnolence, bruxism, restless legs, hypnogagnic hallucinations Other comprehensive 14 point system review is negative   Physical Exam BP 140/60   Pulse 97   Ht '5\' 2"'$  (1.575 m)   Wt 129 lb 3.2 oz (58.6 kg)   LMP 07/27/1998 (LMP Unknown)   BMI 23.63 kg/m    Repeat blood pressure by me was 130/68.  Pulse was in the 60s with the occasional PVC followed by compensatory pause.  Wt Readings from Last 3 Encounters:  12/23/16 129 lb 3.2 oz (58.6 kg)  09/28/16 146 lb (66.2 kg)  09/18/16 146 lb 5 oz (66.4 kg)   General: Alert, oriented, no distress.  Skin: normal turgor, no rashes, warm and dry HEENT: Normocephalic, atraumatic. Pupils equal round and reactive to light; sclera anicteric; extraocular muscles intact; Fundi mild arteriolar narrowing without hemorrhages or exudates Nose without nasal septal hypertrophy Mouth/Parynx benign; Mallinpatti scale 3 Neck: No JVD, no carotid bruits; normal carotid upstroke Lungs: clear to ausculatation and percussion; no wheezing or rales Chest wall: without tenderness to palpitation Heart: PMI not displaced, RRR, s1 s2 normal, 1/6 systolic murmur, no diastolic murmur, no rubs, gallops, thrills, or heaves Abdomen: soft, nontender; no hepatosplenomehaly, BS+; abdominal aorta nontender and not dilated by palpation. Back: no CVA tenderness Pulses 2+ Musculoskeletal: full range of motion, normal strength, no joint deformities Extremities: no clubbing cyanosis or edema, Homan's sign negative  Neurologic: grossly nonfocal; Cranial nerves grossly wnl Psychologic: Normal mood and affect  ECG (independently read by me): Sinus rhythm at 97 bpm with frequent PVCs.  Left bundle branch block.  PR interval 142.  QTc interval 457 ms.  January 2018 ECG (independently read by me): Sinus rhythm at 64 bpm.  Occasional isolated PVC followed by compensatory  pause.  QTc interval 44 ms.  PR interval 128 ms.  No ST segment changes.  July 2017 ECG (independently read by me): Sinus bradycardia 56 bpm.  Normal intervals.  Previously noted T-wave changes in leads 3 and aVF.  ECG (independently read by me): sinus bradycardia 57.  Normal intervals.  Nondiagnostic T changes in lead 27 Nov 2014 ECG (independently read by me): Sinus bradycardia 52 bpm.  ST changes improved.  QTc interval 381 ms.  07/26/2014 ECG (independently read by me): Normal sinus rhythm at 68  bpm.  There is mild downsloping ST segment depression in leads II, III, and F V4 through V6, which is slightly more progressive since the patient's prior ECG done in [place].  LABS: Laboratory from 11/11/2015 including a lipid panel and CBC were reviewed.  BMP Latest Ref Rng & Units 09/29/2016 09/18/2016 02/07/2016  Glucose 65 - 99 mg/dL 131(H) 198(H) 123(H)  BUN 6 - 20 mg/dL '13 17 24  '$ Creatinine 0.44 - 1.00 mg/dL 1.18(H) 1.32(H) 1.21(H)  Sodium 135 - 145 mmol/L 139 139 140  Potassium 3.5 - 5.1 mmol/L 3.8 4.2 4.7  Chloride 101 - 111 mmol/L 106 104 105  CO2 22 - 32 mmol/L '24 23 25  '$ Calcium 8.9 - 10.3 mg/dL 9.1 9.6 10.3   Hepatic Function Latest Ref Rng & Units 09/18/2016 02/07/2016 05/30/2015  Total Protein 6.5 - 8.1 g/dL 6.5 6.8 6.7  Albumin 3.5 - 5.0 g/dL 3.9 4.1 4.3  AST 15 - 41 U/L '26 19 30  '$ ALT 14 - 54 U/L 20 15 32(H)  Alk Phosphatase 38 - 126 U/L 59 72 89  Total Bilirubin 0.3 - 1.2 mg/dL 0.6 0.5 0.5  Bilirubin, Direct 0.0 - 0.3 mg/dL - - -   CBC Latest Ref Rng & Units 09/30/2016 09/29/2016 09/18/2016  WBC 4.0 - 10.5 K/uL 14.1(H) 11.4(H) 8.2  Hemoglobin 12.0 - 15.0 g/dL 10.6(L) 11.0(L) 13.0  Hematocrit 36.0 - 46.0 % 32.4(L) 32.3(L) 38.0  Platelets 150 - 400 K/uL 170 168 239   Lab Results  Component Value Date   MCV 89.3 09/30/2016   MCV 88.0 09/29/2016   MCV 87.8 09/18/2016   Lab Results  Component Value Date   TSH 1.471 05/30/2015  No results found for: HGBA1C  Lipid Panel      Component Value Date/Time   CHOL 126 02/14/2015 0400   TRIG 108 02/14/2015 0400   HDL 32 (L) 02/14/2015 0400   CHOLHDL 3.9 02/14/2015 0400   VLDL 22 02/14/2015 0400   LDLCALC 72 02/14/2015 0400     RADIOLOGY: No results found.  IMPRESSION:  1. CAD in native artery   2. Essential hypertension   3. Palpitations   4. Cerebrovascular accident (CVA), unspecified mechanism ([place])   5. Hyperlipidemia LDL goal <70     ASSESSMENT AND PLAN:  Ms.Norman is a 66 year old female who has a long-standing history of hypertension, which improved with the addition of amlodipine 5 mg added to her Ziac and lisinopril therapy.  In 2015 she had experienced episodic chest tightness. Cardiac catheterization performed after a nuclear perfusion study raised the possibility of anteroseptal defect  revealed a smooth 40-50% LAD stenosis which did not completely normalize following intracoronary nitroglycerin, and otherwise normal coronary arteries.  She had remained stable without recurrent episodes of chest pain on her current medical therapy.  Last year, her lipid studies were still elevated and her dose of atorvastatin was increased.  With which improved with increased atorvastatin therapy at 40 mg.  Her blood pressure today is controlled on amlodipine 5 mg, lisinopril 10 mg, and she has been taking bisoprolol HCTZ 5/6.25 mg. when I last saw her in January for preoperative cardiac clearance, I recommended changing her bisoprolol to Toprol-XL 25 mg for improved ectopy suppression.  She tolerated her knee surgery well.  Apparently,  on 12/11/2016, while home after going up and down carrying laundry, she noticed transient right arm and hand weakness, nausea and vomiting.  She was hospitalized was told of having a stroke.  It sounds as though this  was of cerebellar origin based on her description of significant symptoms.  She had an MRI, CT, carotid Doppler studies, and reportedly had a normal bubble study.  The  records have been requested but have not yet arrived.  At present, her pulse is in the 90s and she is having occasional PVC.  I'm further titrating Toprol-XL to 50.  No grams daily.  If after 4-5 days.  She still notes her heart rate in the 80s or above.  She will titrate this to 75 mg but reduce the dose if her resting pulse gets below 55.  I will await the records.  I discussed with her different etiologies for stroke including thrombotic, embolic, or hemorrhagic.  She's not on anticoagulation.  I will defer to her neurology physician when she sees him back to see if she is a candidate for adjunctive treatment with antiplatelet therapy.  She continues to be on aspirin 81 mg.  She will continue atorvastatin 40 mg with target LDL less than 70.  She will monitor her blood pressure and heart rate.  I am scheduling her to wear a 30 day event monitor to make certain she is not having any potential underlying atrial fibrillation.  If her stroke was cryptogenic we discussed a potentia loop recorder, but  at present she has no interest.  I will see her back in the office in 6-8 weeks for reevaluation.  Time spent: 25 minutes Troy Sine, MD, [facility] 12/23/2016 8:46 AM   Addendum: I was able to obtain the records from the patient's hospitalization in [place] several hours after she left the office.  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  An echo Doppler study done on 12/13/2016 showed an EF of 55-60%.  She normal right ventricular size with normal function.  There was mild left atrial dilatation.  There was mild MR, there was aortic sclerosis without stenosis.  There was mild TR.  Shelva Majestic, MD,FACC 5:23 PM

## 2016-12-23 NOTE — Patient Instructions (Signed)
Medication Instructions:   The metoprolol succ has been increased to 50 mg daily. If after 5 days your heart rate is above 80 then increase to 75 mg ( 1 & 1/2 tablet) .  Labwork:  NONE ORDERED  Testing/Procedures:  Your physician has recommended that you wear a 30 day event monitor for palpitations.. Event monitors are medical devices that record the heart's electrical activity. Doctors most often us these monitors to diagnose arrhythmias. Arrhythmias are problems with the speed or rhythm of the heartbeat. The monitor is a small, portable device. You can wear one while you do your normal daily activities. This is usually used to diagnose what is causing palpitations/syncope (passing out).    Follow-Up:  2 months  Any Other Special Instructions Will Be Listed Below (If Applicable).

## 2016-12-28 ENCOUNTER — Encounter: Payer: Self-pay | Admitting: *Deleted

## 2016-12-28 NOTE — Progress Notes (Signed)
Patient ID: [name], female   DOB: 09-Aug-1950, 66 y.o.   MRN: [id number] Patient enrolled for [employer] to mail a [employer] cardiac event monitor to her home.

## 2016-12-31 ENCOUNTER — Telehealth: Payer: Self-pay | Admitting: Cardiovascular Disease

## 2016-12-31 NOTE — Telephone Encounter (Signed)
Spoke with pt, she is currently taking the metoprolol 50 mg once daily. She counted her pulse for one minute while on the phone with me and she reports 62. She reports her heart skips. Explained her bp cuff may not get the correct reading if heart is skipping. She will continue to monitor and palpate pulse if low on machine, she will call us if she gets consistent readings that are low.

## 2016-12-31 NOTE — Telephone Encounter (Signed)
New Message   pt verbalized that she is calling for rn   To follow up after stroke  Pt c/o BP issue: STAT if pt c/o blurred vision, one-sided weakness or slurred speech  1. What are your last 5 BP readings? 101/47  Hr  44 2. Are you having any other symptoms (ex. Dizziness, headache, blurred vision, passed out)? Pt feels weak and jittery   3. What is your BP issue? Low hr   Do you want pt to decrease the metoprolol

## 2017-01-04 ENCOUNTER — Ambulatory Visit (INDEPENDENT_AMBULATORY_CARE_PROVIDER_SITE_OTHER): Payer: Medicare Other

## 2017-01-04 DIAGNOSIS — R002 Palpitations: Secondary | ICD-10-CM | POA: Diagnosis not present

## 2017-03-05 ENCOUNTER — Encounter: Payer: Self-pay | Admitting: Cardiovascular Disease

## 2017-03-05 ENCOUNTER — Ambulatory Visit (INDEPENDENT_AMBULATORY_CARE_PROVIDER_SITE_OTHER): Payer: Medicare Other | Admitting: Cardiovascular Disease

## 2017-03-05 VITALS — BP 128/58 | HR 90 | Ht 62.0 in | Wt 132.4 lb

## 2017-03-05 DIAGNOSIS — G4700 Insomnia, unspecified: Secondary | ICD-10-CM | POA: Diagnosis not present

## 2017-03-05 DIAGNOSIS — R400 Somnolence: Secondary | ICD-10-CM | POA: Diagnosis not present

## 2017-03-05 DIAGNOSIS — I471 Supraventricular tachycardia: Secondary | ICD-10-CM | POA: Diagnosis not present

## 2017-03-05 DIAGNOSIS — I493 Ventricular premature depolarization: Secondary | ICD-10-CM

## 2017-03-05 DIAGNOSIS — I10 Essential (primary) hypertension: Secondary | ICD-10-CM

## 2017-03-05 DIAGNOSIS — E785 Hyperlipidemia, unspecified: Secondary | ICD-10-CM | POA: Diagnosis not present

## 2017-03-05 DIAGNOSIS — I639 Cerebral infarction, unspecified: Secondary | ICD-10-CM | POA: Diagnosis not present

## 2017-03-05 DIAGNOSIS — R351 Nocturia: Secondary | ICD-10-CM | POA: Diagnosis not present

## 2017-03-05 DIAGNOSIS — I2510 Atherosclerotic heart disease of native coronary artery without angina pectoris: Secondary | ICD-10-CM | POA: Diagnosis not present

## 2017-03-05 MED ORDER — NEBIVOLOL HCL 10 MG PO TABS: 5.0000 mg | ORAL_TABLET | Freq: Every day | ORAL | 3 refills | Status: DC

## 2017-03-05 MED ORDER — DILTIAZEM HCL ER COATED BEADS 180 MG PO CP24: 180.0000 mg | ORAL_CAPSULE | Freq: Every day | ORAL | 0 refills | Status: DC

## 2017-03-05 MED ORDER — DILTIAZEM HCL ER COATED BEADS 180 MG PO CP24: 180.0000 mg | ORAL_CAPSULE | Freq: Every day | ORAL | 3 refills | Status: DC

## 2017-03-05 NOTE — Patient Instructions (Addendum)
Medication Instructions:  STOP amlodipine  START cardizem 180 mg (1 tablet) daily  Take metoprolol 25 mg (1/2 tablet) Saturday and Sunday, then STOP  START Bystolic 5 mg (1/2 tablet) on Monday-samples have been provided.   Testing/Procedures: Your physician has recommended that you have a sleep study. This test records several body functions during sleep, including: brain activity, eye movement, oxygen and carbon dioxide blood levels, heart rate and rhythm, breathing rate and rhythm, the flow of air through your mouth and nose, snoring, body muscle movements, and chest and belly movement.  Follow-Up: Your physician recommends that you schedule a follow-up appointment in: 6-8 weeks with Dr. Tresa EndoKelly.    Any Other Special Instructions Will Be Listed Below (If Applicable).     If you need a refill on your cardiac medications before your next appointment, please call your pharmacy. '

## 2017-03-05 NOTE — Progress Notes (Signed)
Patient ID: [name], female   DOB: 17-May-1951, 66 y.o.   MRN: [id number]    HPI:  [name] is a 66 y.o. female who is followed by Dr. Ginette Otto in [place], [place].  Her relative, [name], is one of my patients.  She presents to the office today for 6 month follow-up cardiology evaluation.   [name] has a history of hypertension dating back for at least 2002, and  recently  has been maintained on lisinopril 10 mg and Ziac 5/6.25 mg daily.  She has experienced episodic chest pressure intermittently and had initially undergone a stress test a  7 years ago.  She also has a history of depression. 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 She had noticed increasing palpitations and also  developed episodes of chest tightness last year. She underwent a nuclear perfusion study in July 2015 She was felt to have a small fixed anteroseptal defect without ischemia.  Ejection fraction was 39%, but was felt that this may not be accurate due to many PVCs during the study.  She subsequently underwent a 2-D echo Doppler study in October 2015 in [place] , which demonstrated an ejection fraction at 55-60%.  There was mild left ventricular posterior wall thickness.  Check equivocal tissue Doppler assessment.  The peak pressure gradient across her aortic valve was 7.6 mm with a mean pressure gradient of 4.3 mm without evidence for significant aortic stenosis.  The patient states that recently she has noticed more chest tightness and this seems to occur with only walking halfway down her driveway.    When I initially saw her,  I added amlodipine 5 mg to help both with blood pressure control as well as potential anti-ischemic benefit.   Due to recurrent symptoms, she underwent a catheterization on 08/03/2014 which revealed smooth 40-50% proximal LAD stenosis after the takeoff of the first diagonal vessel which did not significant normalize following intracoronary nitroglycerin  administration.  Otherwise her coronary anatomy was normal.  She had normal LV function without wall motion abnormalities and evidence for very mild  mitral valve prolapse without significant mitral regurgitation.  She was started on statin therapy with her LDL of 113 attempt to induce plaque stability and regression.  When I last saw her, she was feeling well.  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  She denies any recurrent episodes of chest pain or tightness.  She denies presyncope or syncope.  She was told of having mild osteopenia on bone density imaging.  She underwent a CBC, and lipid study by her primary physician.  Her total cholesterol was 142, HDL 43, LDL 83, and triglycerides 76.  Hemoglobin and hematocrit were 13.2 and 39.1.  She denies any palpitations.She denies any recurrent chest pain.  She does note less shortness of breath.  She is on amlodipine 5 mg Ziac 5/6.25 mg, lisinopril 10 mg daily.  She is now on an increased atorvastatin dose at 40 mg daily.  Lab work from one year ago on 11/08/2014 showed a total cholesterol 236, LDL 163, triglycerides 124.  Her lipid studies have improved under increased atorvastatin dose.    When I last saw  She had noticed an occasional pause when she takes her pulse.  She denies any sensation of tachycardia.  She denies presyncope.  She denies syncope.  There are no episodes of chest pain.  She was  need for right knee surgery to be done by Dr. Delfino Lovett.  She underwent her knee surgery  vessel by Dr. Delfino Lovett on 09/28/2016.  She had developed some swelling initially post procedure.  She was doing well but on 12/11/2016, she developed right arm, hand weakness, nausea and vomiting.  She was hospitalized in [place] and was diagnosed as having a stroke.  Apparently, during that evaluation, she underwent CT imaging, MRI, carotid duplex imaging and appears that she may have had an echo bubble study.Since her discharge, she has noticed significant improvement  in her neurologic capacity.  According to her description, it sounds as though she was significant dysarthric and may have had a cerebellar infarct in the etiology of her presentation.  She was discharged on baby aspirin 81 mg in addition to her lisinopril 5 mg, Toprol XL 25 mg, atorvastatin 40 mg, amlodipine 5 mg.  when I saw her in follow-up at her last office visit, she denied any episodes of chest pain, was unaware of any atrial fibrillation and had noticed episodes of heart rates in the 90s to 100s.  I was able to obtain records from the patient's hospitalization in [place].  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  On 12/14/2014.  An echo Doppler study showed an EF of 55-60% with normal right ventricular size with normal function.  She had mild left atrial dilation, mild MR, aortic sclerosis without stenosis, and mild TR.  Since I saw her, she wore a 2 week event monitor.  This showed sinus rhythm.  She had episodes of sinus tachycardia with PACs, sinus rhythm with PACs, and was an episode of sinus rhythm with PSVT and bigeminal PACs.  Her heart rate had increased to 144 bpm.  During SVT episode.  She also was noted to have sinus tachycardia while sleeping at 5:30 AM for which she was unaware.  She states that she has not felt real well on the metoprolol and was wanting to get off this.  She notes significant fatigue.  She does not sleep well.  She has frequent nycturia.  She admits to daytime fatigue and feeling tired most of the time.  She presents for evaluation.  Past medical history is notable for hypertension, palpitations, depressive disorder, osteoarthritis.  Past surgical history is notable for partial thyroidectomy while she was in eighth grade, and recent right knee surgery   Current Outpatient Prescriptions  Medication Sig Dispense Refill  . ARTIFICIAL TEAR OP Apply 1 drop to eye daily as needed (dry  eyes).    [name] aspirin 81 MG tablet Take 1 tablet (81 mg total) by mouth 2 (two) times daily after a meal. 60 tablet 1  . atorvastatin (LIPITOR) 40 MG tablet TAKE 1 TABLET EVERY DAY (KEEP OFFICE VISIT) 90 tablet 3  . CALCIUM PO Take 1,200 mg by mouth 2 (two) times daily.    . Cholecalciferol (VITAMIN D3) 5000 units CAPS Take 5,000 Units by mouth daily.    . Coenzyme Q10 (COQ-10) 200 MG CAPS Take 200 mg by mouth daily.    [name] lisinopril (PRINIVIL,ZESTRIL) 5 MG tablet TAKE 1 TABLET (5 MG TOTAL) BY MOUTH DAILY. 90 tablet 3  . nitroGLYCERIN (NITROSTAT) 0.4 MG SL tablet Place 1 tablet (0.4 mg total) under the tongue every 5 (five) minutes as needed for chest pain. 25 tablet 3  . Omega-3 Fatty Acids (FISH OIL ULTRA) 1400 MG CAPS Take 1,400 mg by mouth 2 (two) times daily.    [name] diltiazem (CARDIZEM CD) 180 MG 24 hr capsule Take  1 capsule (180 mg total) by mouth daily. 30 capsule 0  . nebivolol (BYSTOLIC) 10 MG tablet Take 0.5 tablets (5 mg total) by mouth daily. 30 tablet 3   No current facility-administered medications for this visit.    Social history is notable in that she is married for 39 years.  She does not have any children.  She lives with her husband.  She completed 12th grade of education.  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  There is no tobacco history or alcohol use.  She does care for many dogs.  Family History  Problem Relation Age of Onset  . Heart failure Mother   . Sudden death Father   . COPD Father   . Cancer Brother   . Cancer Sister    Family history is notable that her mother died at age 79 with possible congestive heart failure.  Her father died at 81 with sudden death.  He had COPD.  A brother died at 50 with cancer and a sister died at 66 with cancer.   ROS General: Negative; No fevers, chills, or night sweats HEENT: Negative; No changes in vision or hearing, sinus congestion, difficulty swallowing Pulmonary: Negative; No  cough, wheezing, shortness of breath, hemoptysis Cardiovascular:  See HPI;  GI: Negative; No nausea, vomiting, diarrhea, or abdominal pain GU: Negative; No dysuria, hematuria, or difficulty voiding Musculoskeletal: Positive for arthritis in her right knee Hematologic/Oncologic: Negative; no easy bruising, bleeding Endocrine: History of partial thyroidectomy Neuro: Negative; no changes in balance, headaches Skin: Negative; No rashes or skin lesions Psychiatric: Prior history of depression, currently stable Sleep: Negative; No daytime sleepiness, hypersomnolence, bruxism, restless legs, hypnogagnic hallucinations Other comprehensive 14 point system review is negative   Physical Exam BP (!) 128/58   Pulse 90   Ht _0  (1.575 m)   Wt 132 lb 6.4 oz (60.1 kg)   LMP 07/27/1998 (LMP Unknown)   BMI 24.22 kg/m    Repeat blood pressure by me was 128/64.  Pulse was in the 90s with the occasional PVC followed by compensatory pause.  Wt Readings from Last 3 Encounters:  03/05/17 132 lb 6.4 oz (60.1 kg)  12/23/16 129 lb 3.2 oz (58.6 kg)  09/28/16 146 lb (66.2 kg)   General: Alert, oriented, no distress.  Skin: normal turgor, no rashes, warm and dry HEENT: Normocephalic, atraumatic. Pupils equal round and reactive to light; sclera anicteric; extraocular muscles intact; on previous fundi exam she had mild arterial narrowing without hemorrhages or exudates Nose without nasal septal hypertrophy Mouth/Parynx benign; Mallinpatti scale 3 Neck: No JVD, no carotid bruits; normal carotid upstroke Lungs: clear to ausculatation and percussion; no wheezing or rales Chest wall: without tenderness to palpitation Heart: PMI not displaced, RRR with occasional ectopy, s1 s2 normal, 1/6 systolic murmur, no diastolic murmur, no rubs, gallops, thrills, or heaves Abdomen: soft, nontender; no hepatosplenomehaly, BS+; abdominal aorta nontender and not dilated by palpation. Back: no CVA tenderness Pulses  2+ Musculoskeletal: full range of motion, normal strength, no joint deformities Extremities: no clubbing cyanosis or edema, Homan's sign negative  Neurologic: grossly nonfocal; Cranial nerves grossly wnl Psychologic: Normal mood and affect   ECG (independently read by me): Sinus rhythm at 90 bpm with frequent PVCs with transient bigeminy.  ECG (independently read by me): Sinus rhythm at 97 bpm with frequent PVCs.  Left bundle branch block.  PR interval 142.  QTc interval 457 ms.  January 2018 ECG (independently read  by me): Sinus rhythm at 64 bpm.  Occasional isolated PVC followed by compensatory pause.  QTc interval 44 ms.  PR interval 128 ms.  No ST segment changes.  July 2017 ECG (independently read by me): Sinus bradycardia 56 bpm.  Normal intervals.  Previously noted T-wave changes in leads 3 and aVF.  ECG (independently read by me): sinus bradycardia 57.  Normal intervals.  Nondiagnostic T changes in lead 27 Nov 2014 ECG (independently read by me): Sinus bradycardia 52 bpm.  ST changes improved.  QTc interval 381 ms.  07/26/2014 ECG (independently read by me): Normal sinus rhythm at 68 bpm.  There is mild downsloping ST segment depression in leads II, III, and F V4 through V6, which is slightly more progressive since the patient's prior ECG done in [place].  LABS: Laboratory from 11/11/2015 including a lipid panel and CBC were reviewed.  BMP Latest Ref Rng & Units 09/29/2016 09/18/2016 02/07/2016  Glucose 65 - 99 mg/dL 131(H) 198(H) 123(H)  BUN 6 - 20 mg/dL _0 Creatinine 0.44 - 1.00 mg/dL 1.18(H) 1.32(H) 1.21(H)  Sodium 135 - 145 mmol/L 139 139 140  Potassium 3.5 - 5.1 mmol/L 3.8 4.2 4.7  Chloride 101 - 111 mmol/L 106 104 105  CO2 22 - 32 mmol/L _1 Calcium 8.9 - 10.3 mg/dL 9.1 9.6 10.3   Hepatic Function Latest Ref Rng & Units 09/18/2016 02/07/2016 05/30/2015  Total Protein 6.5 - 8.1 g/dL 6.5 6.8 6.7  Albumin 3.5 - 5.0 g/dL 3.9 4.1 4.3  AST 15 - 41 U/L _2 ALT 14 - 54 U/L 20 15 32(H)  Alk Phosphatase 38 - 126 U/L 59 72 89  Total Bilirubin 0.3 - 1.2 mg/dL 0.6 0.5 0.5  Bilirubin, Direct 0.0 - 0.3 mg/dL - - -   CBC Latest Ref Rng & Units 09/30/2016 09/29/2016 09/18/2016  WBC 4.0 - 10.5 K/uL 14.1(H) 11.4(H) 8.2  Hemoglobin 12.0 - 15.0 g/dL 10.6(L) 11.0(L) 13.0  Hematocrit 36.0 - 46.0 % 32.4(L) 32.3(L) 38.0  Platelets 150 - 400 K/uL 170 168 239   Lab Results  Component Value Date   MCV 89.3 09/30/2016   MCV 88.0 09/29/2016   MCV 87.8 09/18/2016   Lab Results  Component Value Date   TSH 1.471 05/30/2015  No results found for: HGBA1C  Lipid Panel     Component Value Date/Time   CHOL 126 02/14/2015 0400   TRIG 108 02/14/2015 0400   HDL 32 (L) 02/14/2015 0400   CHOLHDL 3.9 02/14/2015 0400   VLDL 22 02/14/2015 0400   LDLCALC 72 02/14/2015 0400     RADIOLOGY: No results found.  IMPRESSION:  1. Essential hypertension   2. Paroxysmal SVT (supraventricular tachycardia) ([place])   3. Unifocal PVCs   4. Hyperlipidemia LDL goal <70   5. Daytime sleepiness   6. Nocturia   7. Frequent nocturnal awakening   8. Cerebrovascular accident (CVA), unspecified mechanism ([place])     ASSESSMENT AND PLAN:  [name] is a 66 year old female who has a long-standing history of hypertension, which improved with the addition of amlodipine 5 mg added to her Ziac and lisinopril therapy.  In 2015 she had experienced episodic chest tightness. Cardiac catheterization performed after a nuclear perfusion study raised the possibility of anteroseptal defect  revealed a smooth 40-50% LAD stenosis which did not completely normalize following intracoronary nitroglycerin, and otherwise normal coronary arteries.  She had remained stable without recurrent episodes of chest pain on her current  medical therapy.  Lipid studies were still elevated in 2017 and her dose of atorvastatin to 40 mg with improved results.  When I last saw her, since she was having ectopy on  bisoprolol I recommended that she change to Toprol-XL. She was hospitalized was told of having a stroke.  It sounds as though this was of cerebellar origin based on her description of significant symptoms.  She had an MRI, CT, carotid Doppler studies, and echo Doppler study showed normal ejection fraction at 55-60%.  It was no obvious evidence of a PFO on color Doppler or bubble study.  I reviewed her event monitor with her in detail.  She predominantly is in sinus rhythm with occasional PACs.  There were episodes of sinus tachycardia.  There was one episode of PSVT with heart rates of 1 44 bpm.  Of note, there was also an episode of sinus tachycardia at 5:30 AM all.  She was sleeping.  She has requested that she get off the metoprolol due to significant fatigue.  In attempt to improve rate control.  I will discontinue her amlodipine and switch her to Cardizem CD 180 mg for blood pressure as well.  I will wean metoprolol, and in its place will initiate Bystolic at 5 mg, which should have less fatigability and titrate the dose up to 10 mg when metoprolol succinate is completely discontinued.  She has had periods of tachycardia while sleeping.  Is certainly possible with her poor sleep, nocturia, and fatigue.  She may have a component of obstructive sleep apnea.  I have recommended a sleep study for further evaluation.  She continues to be on atorvastatin for hyperlipidemia.  She will now be on Cardizem, Bystolic, in addition to lisinopril for hypertension.  She continues to take vitamin D supplementation for low vitamin D levels.  I will see her in 6 weeks for reevaluation or sooner if problems arise.   Time spent: 25 minutes Sarah Sine, MD, [facility] 03/07/2017 11:30 PM

## 2017-03-07 IMAGING — DX DG KNEE 1-2V PORT*R*
2 series · 2 of 2 positions shown · non-contrast
Comparison: None.

CLINICAL DATA: Status post right knee replacement today.

EXAM:
PORTABLE RIGHT KNEE - 1-2 VIEW

[knee ap]
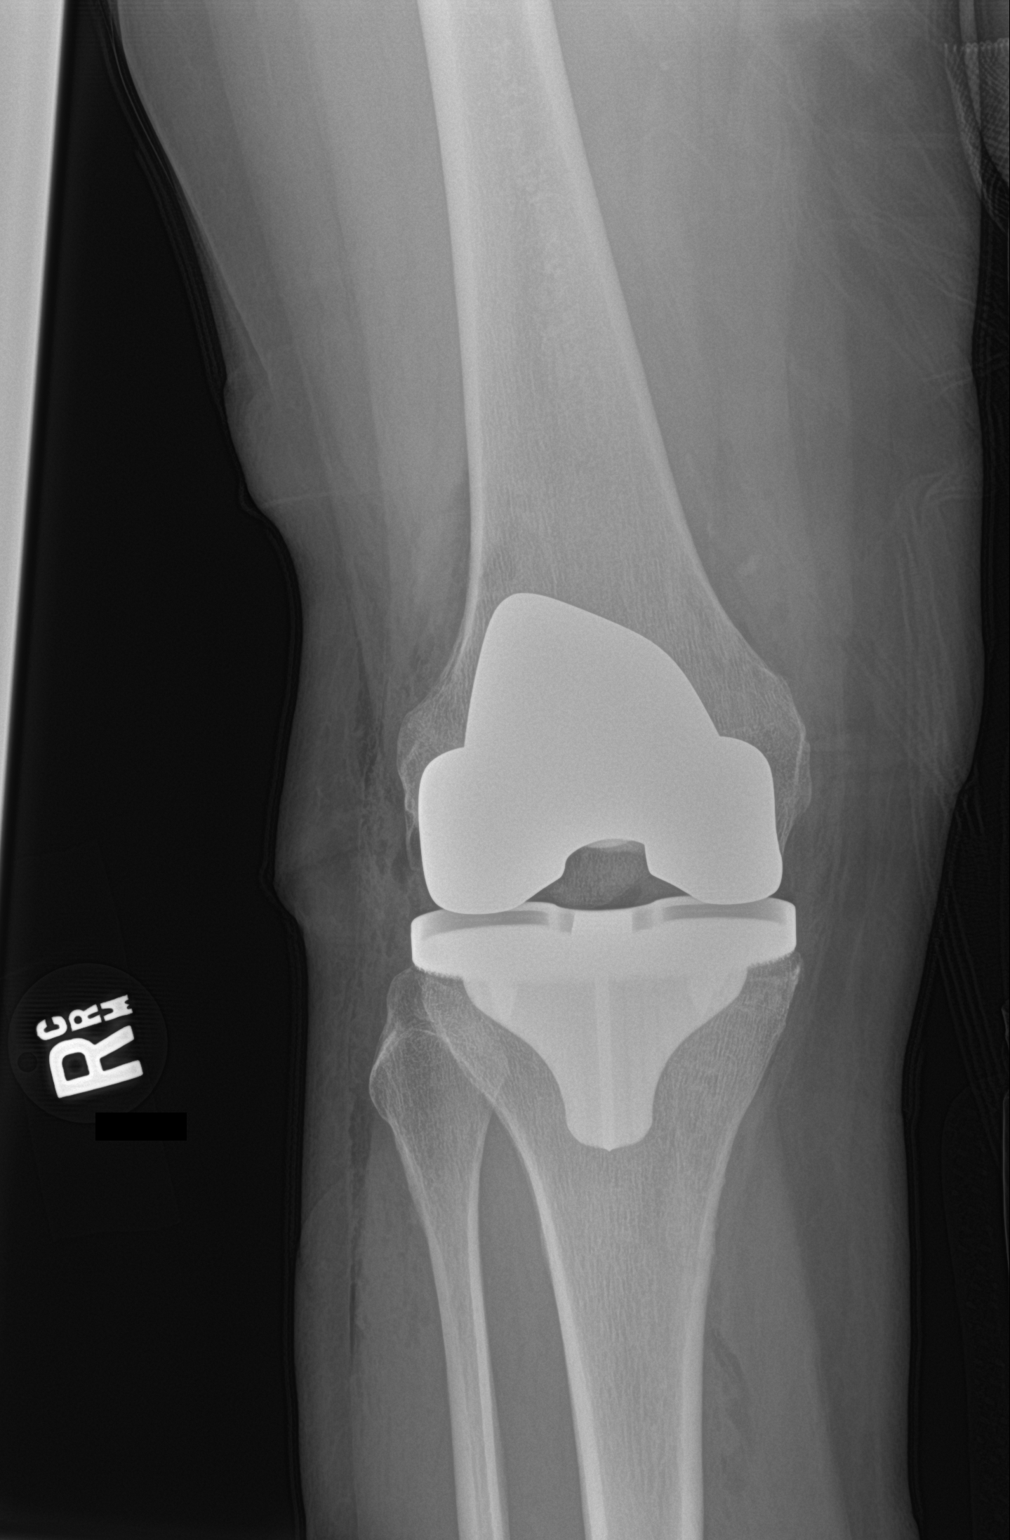

[knee lat]
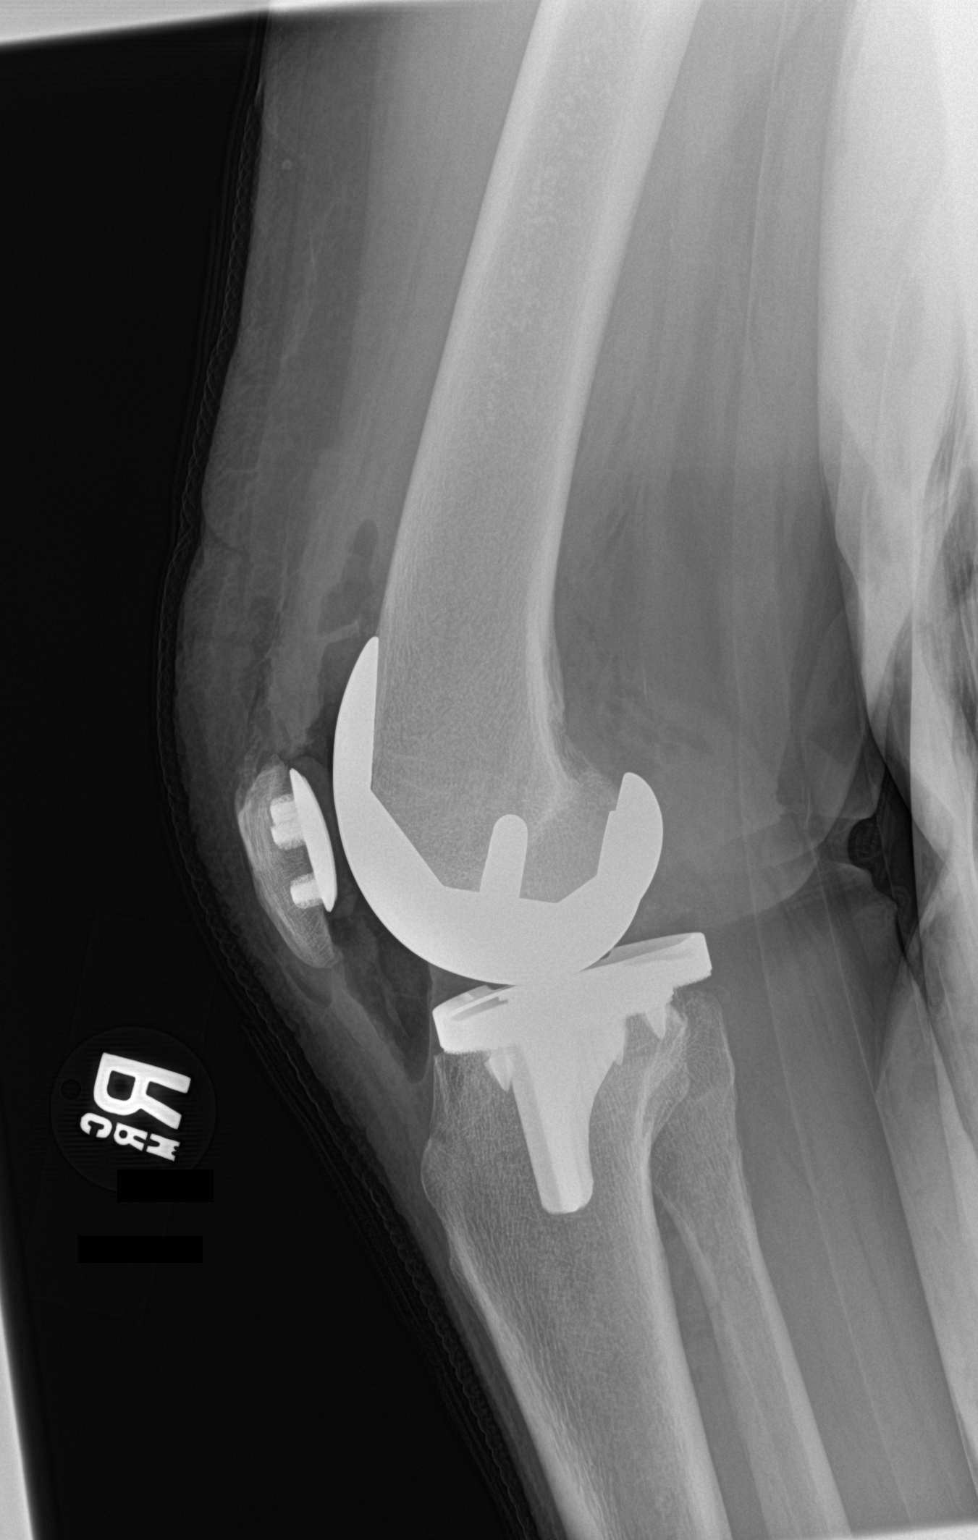

[2 of 2 positions shown; findings below may reference images not displayed]

FINDINGS: Right total knee arthroplasty is in place. There is some gas in the
soft tissues from surgery. Hardware is intact and normally
positioned. No fracture.
IMPRESSION: Status post right total knee replacement.  No acute finding.

## 2017-03-08 NOTE — Addendum Note (Signed)
Addended by: Chana BodeGREEN, Miche Loughridge L on: 03/08/2017 08:54 AM   Modules accepted: Orders

## 2017-03-11 ENCOUNTER — Telehealth: Payer: Self-pay | Admitting: Cardiovascular Disease

## 2017-03-11 MED ORDER — ATORVASTATIN CALCIUM 40 MG PO TABS: ORAL_TABLET | ORAL | 0 refills | Status: DC

## 2017-03-11 MED ORDER — NEBIVOLOL HCL 10 MG PO TABS: 5.0000 mg | ORAL_TABLET | Freq: Every day | ORAL | 0 refills | Status: DC

## 2017-03-11 MED ORDER — DILTIAZEM HCL ER COATED BEADS 180 MG PO CP24: 180.0000 mg | ORAL_CAPSULE | Freq: Every day | ORAL | 0 refills | Status: DC

## 2017-03-11 NOTE — Telephone Encounter (Signed)
New message      *STAT* If patient is at the pharmacy, call can be transferred to refill team.   1. Which medications need to be refilled? (please list name of each medication and dose if known)  Lipitor 40 mg , diltiazam 180 mg 24hr, bystolic 5mg   2. Which pharmacy/location (including street and city if local pharmacy) is medication to be sent to? [facility] mail order   3. Do they need a 30 day or 90 day supply?  90

## 2017-03-11 NOTE — Telephone Encounter (Signed)
sent 

## 2017-05-13 ENCOUNTER — Encounter: Payer: Self-pay | Admitting: Cardiovascular Disease

## 2017-05-13 ENCOUNTER — Ambulatory Visit (INDEPENDENT_AMBULATORY_CARE_PROVIDER_SITE_OTHER): Payer: Medicare Other | Admitting: Cardiovascular Disease

## 2017-05-13 VITALS — BP 154/70 | HR 56 | Ht 62.0 in | Wt 134.0 lb

## 2017-05-13 DIAGNOSIS — I639 Cerebral infarction, unspecified: Secondary | ICD-10-CM | POA: Diagnosis not present

## 2017-05-13 DIAGNOSIS — I2510 Atherosclerotic heart disease of native coronary artery without angina pectoris: Secondary | ICD-10-CM | POA: Diagnosis not present

## 2017-05-13 DIAGNOSIS — I471 Supraventricular tachycardia: Secondary | ICD-10-CM

## 2017-05-13 DIAGNOSIS — I10 Essential (primary) hypertension: Secondary | ICD-10-CM

## 2017-05-13 DIAGNOSIS — R002 Palpitations: Secondary | ICD-10-CM

## 2017-05-13 MED ORDER — LISINOPRIL 10 MG PO TABS: 10.0000 mg | ORAL_TABLET | Freq: Every day | ORAL | 3 refills | Status: DC

## 2017-05-13 MED ORDER — NEBIVOLOL HCL 5 MG PO TABS: 5.0000 mg | ORAL_TABLET | Freq: Every day | ORAL | 3 refills | Status: DC

## 2017-05-13 NOTE — Patient Instructions (Signed)
Medication Instructions:  INCREASE lisinopril to 10 mg daily  Follow-Up: Your physician wants you to follow-up in: 6 months with Dr. Tresa EndoKelly.  You will receive a reminder letter in the mail two months in advance. If you don't receive a letter, please call our office to schedule the follow-up appointment.   Any Other Special Instructions Will Be Listed Below (If Applicable).     If you need a refill on your cardiac medications before your next appointment, please call your pharmacy.

## 2017-05-13 NOTE — Progress Notes (Signed)
Patient ID: [name], female   DOB: [birth year], 66 y.o.   MRN: [id number]    HPI:  [name] is a 66 y.o. female who is followed by Dr. Ginette Otto in [place], [place].  Her relative, [name], is one of my patients.  She presents to the office today for 2 month follow-up cardiology evaluation.   [name] has a history of hypertension dating back for at least 2002, and  recently  has been maintained on lisinopril 10 mg and Ziac 5/6.25 mg daily.  She has experienced episodic chest pressure intermittently and had initially undergone a stress test a  7 years ago.  She also has a history of depression. 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 She had noticed increasing palpitations and also  developed episodes of chest tightness last year. She underwent a nuclear perfusion study in July 2015 She was felt to have a small fixed anteroseptal defect without ischemia.  Ejection fraction was 39%, but was felt that this may not be accurate due to many PVCs during the study.  She subsequently underwent a 2-D echo Doppler study in October 2015 in [place] , which demonstrated an ejection fraction at 55-60%.  There was mild left ventricular posterior wall thickness.  Check equivocal tissue Doppler assessment.  The peak pressure gradient across her aortic valve was 7.6 mm with a mean pressure gradient of 4.3 mm without evidence for significant aortic stenosis.  The patient states that recently she has noticed more chest tightness and this seems to occur with only walking halfway down her driveway.    When I initially saw her,  I added amlodipine 5 mg to help both with blood pressure control as well as potential anti-ischemic benefit.   Due to recurrent symptoms, she underwent a catheterization on 08/03/2014 which revealed smooth 40-50% proximal LAD stenosis after the takeoff of the first diagonal vessel which did not significant normalize following intracoronary nitroglycerin  administration.  Otherwise her coronary anatomy was normal.  She had normal LV function without wall motion abnormalities and evidence for very mild  mitral valve prolapse without significant mitral regurgitation.  She was started on statin therapy with her LDL of 113 attempt to induce plaque stability and regression.  When I last saw her, she was feeling well.  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  She denies any recurrent episodes of chest pain or tightness.  She denies presyncope or syncope.  She was told of having mild osteopenia on bone density imaging.  She underwent a CBC, and lipid study by her primary physician.  Her total cholesterol was 142, HDL 43, LDL 83, and triglycerides 76.  Hemoglobin and hematocrit were 13.2 and 39.1.  She denies any palpitations.She denies any recurrent chest pain.  She does note less shortness of breath.  She is on amlodipine 5 mg Ziac 5/6.25 mg, lisinopril 10 mg daily.  She is now on an increased atorvastatin dose at 40 mg daily.  Lab work from one year ago on 11/08/2014 showed a total cholesterol 236, LDL 163, triglycerides 124.  Her lipid studies have improved under increased atorvastatin dose.    When I last saw  She had noticed an occasional pause when she takes her pulse.  She denies any sensation of tachycardia.  She denies presyncope.  She denies syncope.  There are no episodes of chest pain.  She was  need for right knee surgery to be done by Dr. Delfino Lovett.  She underwent her knee surgery  vessel by Dr. Delfino Lovett on 09/28/2016.  She had developed some swelling initially post procedure.  She was doing well but on 12/11/2016, she developed right arm, hand weakness, nausea and vomiting.  She was hospitalized in [place] and was diagnosed as having a stroke.  Apparently, during that evaluation, she underwent CT imaging, MRI, carotid duplex imaging and appears that she may have had an echo bubble study.Since her discharge, she has noticed significant improvement  in her neurologic capacity.  According to her description, it sounds as though she was significant dysarthric and may have had a cerebellar infarct in the etiology of her presentation.  She was discharged on baby aspirin 81 mg in addition to her lisinopril 5 mg, Toprol XL 25 mg, atorvastatin 40 mg, amlodipine 5 mg.  when I saw her in follow-up at her last office visit, she denied any episodes of chest pain, was unaware of any atrial fibrillation and had noticed episodes of heart rates in the 90s to 100s.  I was able to obtain records from the patient's hospitalization in [place].  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  On 12/14/2014.  An echo Doppler study showed an EF of 55-60% with normal right ventricular size with normal function.  She had mild left atrial dilation, mild MR, aortic sclerosis without stenosis, and mild TR.  She wore a in June 2018.  week event monitor.  This showed sinus rhythm.  She had episodes of sinus tachycardia with PACs, sinus rhythm with PACs, and was an episode of sinus rhythm with PSVT and bigeminal PACs.  Her heart rate had increased to 144 bpm.  During SVT episode.  She also was noted to have sinus tachycardia while sleeping at 5:30 AM for which she was unaware.  When I saw her in follow-up, she had not felt well on the metoprolol and I weaned and ultimately discontinued this and in its place started Bystolic at 5 mg with potential titration to 10 mg as tolerated.  I also discontinued her amlodipine and switch to Cardizem CD 180 mg, both for blood pressure control as well as in attempt to improve rate control.  With this medication adjustment, she has felt significantly better.  She denies any episodes of tachycardia.  She denies any awareness of pulse irregularity.  Titrated the dose up to 10 mg of Bystolic.  She presents for evaluation.  She states that she has not felt real well on the  metoprolol and was wanting to get off this.  She notes significant fatigue.  She does not sleep well.  She has frequent nycturia.  She admits to daytime fatigue and feeling tired most of the time.  She presents for evaluation.  Past medical history is notable for hypertension, palpitations, depressive disorder, osteoarthritis.  Past surgical history is notable for partial thyroidectomy while she was in eighth grade, and recent right knee surgery   Current Outpatient Prescriptions  Medication Sig Dispense Refill  . ARTIFICIAL TEAR OP Apply 1 drop to eye daily as needed (dry eyes).    [name] aspirin 81 MG tablet Take 1 tablet (81 mg total) by mouth 2 (two) times daily after a meal. 60 tablet 1  . atorvastatin (LIPITOR) 40 MG tablet TAKE 1 TABLET EVERY DAY (KEEP OFFICE VISIT) 90 tablet 0  . CALCIUM PO Take 1,200 mg by mouth 2 (two) times daily.    . Cholecalciferol (VITAMIN D3) 5000 units CAPS  Take 5,000 Units by mouth daily.    . Coenzyme Q10 (COQ-10) 200 MG CAPS Take 200 mg by mouth daily.    [name] diltiazem (CARDIZEM CD) 180 MG 24 hr capsule Take 1 capsule (180 mg total) by mouth daily. 90 capsule 0  . lisinopril (PRINIVIL,ZESTRIL) 10 MG tablet Take 1 tablet (10 mg total) by mouth daily. 90 tablet 3  . nebivolol (BYSTOLIC) 5 MG tablet Take 1 tablet (5 mg total) by mouth daily. 90 tablet 3  . nitroGLYCERIN (NITROSTAT) 0.4 MG SL tablet Place 1 tablet (0.4 mg total) under the tongue every 5 (five) minutes as needed for chest pain. 25 tablet 3  . Omega-3 Fatty Acids (FISH OIL ULTRA) 1400 MG CAPS Take 1,400 mg by mouth 2 (two) times daily.     No current facility-administered medications for this visit.    Social history is notable in that she is married for 39 years.  She does not have any children.  She lives with her husband.  She completed 12th grade of education.  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  There is no tobacco history or alcohol use.  She does  care for many dogs.  Family History  Problem Relation Age of Onset  . Heart failure Mother   . Sudden death Father   . COPD Father   . Cancer Brother   . Cancer Sister    Family history is notable that her mother died at age 71 with possible congestive heart failure.  Her father died at 56 with sudden death.  He had COPD.  A brother died at 14 with cancer and a sister died at 22 with cancer.   ROS General: Negative; No fevers, chills, or night sweats HEENT: Negative; No changes in vision or hearing, sinus congestion, difficulty swallowing Pulmonary: Negative; No cough, wheezing, shortness of breath, hemoptysis Cardiovascular:  See HPI;  GI: Negative; No nausea, vomiting, diarrhea, or abdominal pain GU: Negative; No dysuria, hematuria, or difficulty voiding Musculoskeletal: Positive for arthritis in her right knee Hematologic/Oncologic: Negative; no easy bruising, bleeding Endocrine: History of partial thyroidectomy Neuro: Negative; no changes in balance, headaches Skin: Negative; No rashes or skin lesions Psychiatric: Prior history of depression, currently stable Sleep: Negative; No daytime sleepiness, hypersomnolence, bruxism, restless legs, hypnogagnic hallucinations Other comprehensive 14 point system review is negative   Physical Exam BP (!) 154/70   Pulse (!) 56   Ht '5\' 2"'$  (1.575 m)   Wt 134 lb (60.8 kg)   LMP 07/27/1998 (LMP Unknown)   BMI 24.51 kg/m    Repeat blood pressure by me today was elevated at 160/70 supine and 160/72 standing.  Wt Readings from Last 3 Encounters:  05/13/17 134 lb (60.8 kg)  03/05/17 132 lb 6.4 oz (60.1 kg)  12/23/16 129 lb 3.2 oz (58.6 kg)   General: Alert, oriented, no distress.  Skin: normal turgor, no rashes, warm and dry HEENT: Normocephalic, atraumatic. Pupils equal round and reactive to light; sclera anicteric; extraocular muscles intact;  Nose without nasal septal hypertrophy Mouth/Parynx benign; Mallinpatti scale 3 Neck: No  JVD, no carotid bruits; normal carotid upstroke Lungs: clear to ausculatation and percussion; no wheezing or rales Chest wall: without tenderness to palpitation Heart: PMI not displaced, RRR, no ectopy with prolonged auscultation s1 s2 normal, 1/6 systolic murmur, no diastolic murmur, no rubs, gallops, thrills, or heaves Abdomen: soft, nontender; no hepatosplenomehaly, BS+; abdominal aorta nontender and not dilated by palpation. Back: no CVA  tenderness Pulses 2+ Musculoskeletal: full range of motion, normal strength, no joint deformities Extremities: no clubbing cyanosis or edema, Homan's sign negative  Neurologic: grossly nonfocal; Cranial nerves grossly wnl Psychologic: Normal mood and affect   ECG (independently read by me): Sinus bradycardia 56 bpm.  Left axis deviation.  Left bundle branch block.  QTc interval 424 ms.  August 2018 ECG (independently read by me): Sinus rhythm at 90 bpm with frequent PVCs with transient bigeminy.  May 2018 ECG (independently read by me): Sinus rhythm at 97 bpm with frequent PVCs.  Left bundle branch block.  PR interval 142.  QTc interval 457 ms.  January 2018 ECG (independently read by me): Sinus rhythm at 64 bpm.  Occasional isolated PVC followed by compensatory pause.  QTc interval 44 ms.  PR interval 128 ms.  No ST segment changes.  July 2017 ECG (independently read by me): Sinus bradycardia 56 bpm.  Normal intervals.  Previously noted T-wave changes in leads 3 and aVF.  ECG (independently read by me): sinus bradycardia 57.  Normal intervals.  Nondiagnostic T changes in lead 27 Nov 2014 ECG (independently read by me): Sinus bradycardia 52 bpm.  ST changes improved.  QTc interval 381 ms.  07/26/2014 ECG (independently read by me): Normal sinus rhythm at 68 bpm.  There is mild downsloping ST segment depression in leads II, III, and F V4 through V6, which is slightly more progressive since the patient's prior ECG done in  [place].  LABS: Laboratory from 11/11/2015 including a lipid panel and CBC were reviewed.  BMP Latest Ref Rng & Units 09/29/2016 09/18/2016 02/07/2016  Glucose 65 - 99 mg/dL 131(H) 198(H) 123(H)  BUN 6 - 20 mg/dL '13 17 24  '$ Creatinine 0.44 - 1.00 mg/dL 1.18(H) 1.32(H) 1.21(H)  Sodium 135 - 145 mmol/L 139 139 140  Potassium 3.5 - 5.1 mmol/L 3.8 4.2 4.7  Chloride 101 - 111 mmol/L 106 104 105  CO2 22 - 32 mmol/L '24 23 25  '$ Calcium 8.9 - 10.3 mg/dL 9.1 9.6 10.3   Hepatic Function Latest Ref Rng & Units 09/18/2016 02/07/2016 05/30/2015  Total Protein 6.5 - 8.1 g/dL 6.5 6.8 6.7  Albumin 3.5 - 5.0 g/dL 3.9 4.1 4.3  AST 15 - 41 U/L '26 19 30  '$ ALT 14 - 54 U/L 20 15 32(H)  Alk Phosphatase 38 - 126 U/L 59 72 89  Total Bilirubin 0.3 - 1.2 mg/dL 0.6 0.5 0.5  Bilirubin, Direct 0.0 - 0.3 mg/dL - - -   CBC Latest Ref Rng & Units 09/30/2016 09/29/2016 09/18/2016  WBC 4.0 - 10.5 K/uL 14.1(H) 11.4(H) 8.2  Hemoglobin 12.0 - 15.0 g/dL 10.6(L) 11.0(L) 13.0  Hematocrit 36.0 - 46.0 % 32.4(L) 32.3(L) 38.0  Platelets 150 - 400 K/uL 170 168 239   Lab Results  Component Value Date   MCV 89.3 09/30/2016   MCV 88.0 09/29/2016   MCV 87.8 09/18/2016   Lab Results  Component Value Date   TSH 1.471 05/30/2015  No results found for: HGBA1C  Lipid Panel     Component Value Date/Time   CHOL 126 02/14/2015 0400   TRIG 108 02/14/2015 0400   HDL 32 (L) 02/14/2015 0400   CHOLHDL 3.9 02/14/2015 0400   VLDL 22 02/14/2015 0400   LDLCALC 72 02/14/2015 0400     RADIOLOGY: No results found.  IMPRESSION:  1. Paroxysmal SVT (supraventricular tachycardia) (HCC)   2. Palpitations   3. Essential hypertension   4. History of Cerebrovascular accident (CVA), unspecified mechanism (  [place])     ASSESSMENT AND PLAN:  Ms.Reihl is a 66 year old female who has a long-standing history of hypertension, which initially improved with the addition of amlodipine 5 mg added to her Ziac and lisinopril therapy.  In 2015 she had  experienced episodic chest tightness. Cardiac catheterization performed after a nuclear perfusion study raised the possibility of anteroseptal defect  revealed a smooth 40-50% LAD stenosis which did not completely normalize following intracoronary nitroglycerin, and otherwise normal coronary arteries.  She had remained stable without recurrent episodes of chest pain on her current medical therapy.  Lipid studies were still elevated in 2017 and her dose of atorvastatin to 40 mg with improved results. She was hospitalized in [place] and was told of having a stroke.  It sounds as though this was of cerebellar origin based on her description of significant symptoms.  She had an MRI, CT, carotid Doppler studies, and echo Doppler study showed normal ejection fraction at 55-60%.  It was no obvious evidence of a PFO on color Doppler or bubble study.  For 2 week event monitor in June 2018 showed dominantly sinus rhythm with occasional PACs.  There were episodes of sinus tachycardia.  There was one episode of PSVT with heart rates of 1 44 bpm.  Of note, there was also an episode of sinus tachycardia at 5:30 AM all.  She was sleeping.  She has requested that she get off the metoprolol due to significant fatigue.  In attempt to improve rate contrpl.  When I last saw her, she was not feeling as well on metoprolol.  She has noticed significant improvement in how she feels with initiation of Bystolic currently at 5 mg and also with Cardizem CD 180 mg in place of amlodipine.  Her ECG is stable with a ventricular rate at 56.  However, her blood pressure is elevated.  Since her heart rate is already in the mid 50s on her current dose of diltiazem and Bystolic.  I will not increase these, but rather will increase lisinopril to 10 mg daily for more improved blood pressure control.  She will monitor her blood pressure at home.  She is sleeping well.  She continues to be on atorvastatin for hyperlipidemia and is tolerating 40 mg  daily.  She is also on omega-3 fatty acid.  She is asked that we give for a 5 mg pill of Bystolic instead of the 10 mg, which she has been breaking in half and often has noticed pill crumble.  I will see her in 6 months for reevaluation or sooner from's arise.  Time spent: 25 minutes Troy Sine, MD, [facility] 05/13/2017 5:55 PM

## 2017-05-14 NOTE — Addendum Note (Signed)
Addended by: Chana BodeGREEN, Deasha Clendenin L on: 05/14/2017 04:42 PM   Modules accepted: Orders

## 2017-05-29 ENCOUNTER — Other Ambulatory Visit: Payer: Self-pay | Admitting: Cardiovascular Disease

## 2017-06-04 ENCOUNTER — Other Ambulatory Visit: Payer: Self-pay | Admitting: Cardiovascular Disease

## 2017-08-13 ENCOUNTER — Other Ambulatory Visit: Payer: Self-pay | Admitting: Cardiovascular Disease

## 2017-11-02 ENCOUNTER — Encounter: Payer: Self-pay | Admitting: Cardiovascular Disease

## 2017-11-08 ENCOUNTER — Ambulatory Visit (INDEPENDENT_AMBULATORY_CARE_PROVIDER_SITE_OTHER): Payer: Medicare Other | Admitting: Cardiovascular Disease

## 2017-11-08 ENCOUNTER — Encounter: Payer: Self-pay | Admitting: Cardiovascular Disease

## 2017-11-08 VITALS — BP 153/64 | HR 66 | Ht 62.0 in | Wt 138.8 lb

## 2017-11-08 DIAGNOSIS — R002 Palpitations: Secondary | ICD-10-CM | POA: Diagnosis not present

## 2017-11-08 DIAGNOSIS — I10 Essential (primary) hypertension: Secondary | ICD-10-CM | POA: Diagnosis not present

## 2017-11-08 DIAGNOSIS — I471 Supraventricular tachycardia: Secondary | ICD-10-CM | POA: Diagnosis not present

## 2017-11-08 DIAGNOSIS — E785 Hyperlipidemia, unspecified: Secondary | ICD-10-CM

## 2017-11-08 MED ORDER — NEBIVOLOL HCL 10 MG PO TABS: 10.0000 mg | ORAL_TABLET | Freq: Every day | ORAL | 3 refills | Status: DC

## 2017-11-08 NOTE — Patient Instructions (Signed)
Medication Instructions:  INCREASE Bystolic to 10 mg daily  Follow-Up: Your physician wants you to follow-up in: 6 months with Dr. Tresa EndoKelly. You will receive a reminder letter in the mail two months in advance. If you don't receive a letter, please call our office to schedule the follow-up appointment.   Any Other Special Instructions Will Be Listed Below (If Applicable).  Try to exercise 5 x weekly, for 30 mins    If you need a refill on your cardiac medications before your next appointment, please call your pharmacy.

## 2017-11-08 NOTE — Progress Notes (Signed)
Patient ID: [name], female   DOB: April 05, 1951, 67 y.o.   MRN: [id number]    HPI:  [name] is a 67 y.o. female who is followed by Dr. Ginette Otto in [place], [place].  Her relative, [name], is one of my patients.  She presents to the office today for 6 month follow-up cardiology evaluation.   [name] has a history of hypertension dating back for at least 2002, and  recently  has been maintained on lisinopril 10 mg and Ziac 5/6.25 mg daily.  She has experienced episodic chest pressure intermittently and had initially undergone a stress test a  7 years ago.  She also has a history of depression. 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 She had noticed increasing palpitations and also  developed episodes of chest tightness last year. She underwent a nuclear perfusion study in July 2015 She was felt to have a small fixed anteroseptal defect without ischemia.  Ejection fraction was 39%, but was felt that this may not be accurate due to many PVCs during the study.  She subsequently underwent a 2-D echo Doppler study in October 2015 in [place] , which demonstrated an ejection fraction at 55-60%.  There was mild left ventricular posterior wall thickness.  Check equivocal tissue Doppler assessment.  The peak pressure gradient across her aortic valve was 7.6 mm with a mean pressure gradient of 4.3 mm without evidence for significant aortic stenosis.  The patient states that recently she has noticed more chest tightness and this seems to occur with only walking halfway down her driveway.    When I initially saw her,  I added amlodipine 5 mg to help both with blood pressure control as well as potential anti-ischemic benefit.   Due to recurrent symptoms, she underwent a catheterization on 08/03/2014 which revealed smooth 40-50% proximal LAD stenosis after the takeoff of the first diagonal vessel which did not significant normalize following intracoronary nitroglycerin  administration.  Otherwise her coronary anatomy was normal.  She had normal LV function without wall motion abnormalities and evidence for very mild  mitral valve prolapse without significant mitral regurgitation.  She was started on statin therapy with her LDL of 113 attempt to induce plaque stability and regression.  When I last saw her, she was feeling well.  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  She denies any recurrent episodes of chest pain or tightness.  She denies presyncope or syncope.  She was told of having mild osteopenia on bone density imaging.  She underwent a CBC, and lipid study by her primary physician.  Her total cholesterol was 142, HDL 43, LDL 83, and triglycerides 76.  Hemoglobin and hematocrit were 13.2 and 39.1.  She denies any palpitations.She denies any recurrent chest pain.  She does note less shortness of breath.  She is on amlodipine 5 mg Ziac 5/6.25 mg, lisinopril 10 mg daily.  She is now on an increased atorvastatin dose at 40 mg daily.  Lab work from one year ago on 11/08/2014 showed a total cholesterol 236, LDL 163, triglycerides 124.  Her lipid studies have improved under increased atorvastatin dose.    When I last saw  She had noticed an occasional pause when she takes her pulse.  She denies any sensation of tachycardia.  She denies presyncope.  She denies syncope.  There are no episodes of chest pain.  She was  need for right knee surgery to be done by Dr. Delfino Lovett.  She underwent her knee surgery  vessel by Dr. Delfino Lovett on 09/28/2016.  She had developed some swelling initially post procedure.  She was doing well but on 12/11/2016, she developed right arm, hand weakness, nausea and vomiting.  She was hospitalized in [place] and was diagnosed as having a stroke.  Apparently, during that evaluation, she underwent CT imaging, MRI, carotid duplex imaging and appears that she may have had an echo bubble study.Since her discharge, she has noticed significant improvement  in her neurologic capacity.  According to her description, it sounds as though she was significant dysarthric and may have had a cerebellar infarct in the etiology of her presentation.  She was discharged on baby aspirin 81 mg in addition to her lisinopril 5 mg, Toprol XL 25 mg, atorvastatin 40 mg, amlodipine 5 mg.  when I saw her in follow-up at her last office visit, she denied any episodes of chest pain, was unaware of any atrial fibrillation and had noticed episodes of heart rates in the 90s to 100s.  I was able to obtain records from the patient's hospitalization in [place].  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  On 12/14/2014.  An echo Doppler study showed an EF of 55-60% with normal right ventricular size with normal function.  She had mild left atrial dilation, mild MR, aortic sclerosis without stenosis, and mild TR.  She wore an event monitor in June 2018..  This showed sinus rhythm.  She had episodes of sinus tachycardia with PACs, sinus rhythm with PACs, and was an episode of sinus rhythm with PSVT and bigeminal PACs.  Her heart rate had increased to 144 bpm.  During SVT episode.  She also was noted to have sinus tachycardia while sleeping at 5:30 AM for which she was unaware.  When I saw her in follow-up, she had not felt well on the metoprolol and I weaned and ultimately discontinued this and in its place started Bystolic at 5 mg with potential titration to 10 mg as tolerated.  I also discontinued her amlodipine and switch to Cardizem CD 180 mg, both for blood pressure control as well as in attempt to improve rate control.  With this medication adjustment, she has felt significantly better.    When I last saw her in October 2018, her ECG was stable with a ventricular rate at 56 bpm.  Her blood pressure was elevated.  She had felt improved on Bystolic 5 mg in addition to Cardizem CD 180 mg.  I recommended  further titration of lisinopril.  She denies any chest pain.  She is unaware of significant palpitations.  She denies presyncope or syncope.  She presents for reevaluation.  Past medical history is notable for hypertension, palpitations, depressive disorder, osteoarthritis.  Past surgical history is notable for partial thyroidectomy while she was in eighth grade, and recent right knee surgery  Current Outpatient Medications  Medication Sig Dispense Refill  . ARTIFICIAL TEAR OP Apply 1 drop to eye daily as needed (dry eyes).    [name] aspirin 81 MG tablet Take 1 tablet (81 mg total) by mouth 2 (two) times daily after a meal. 60 tablet 1  . atorvastatin (LIPITOR) 40 MG tablet TAKE 1 TABLET EVERY DAY  (KEEP  OFFICE  VISIT) 90 tablet 2  . CALCIUM PO Take 1,200 mg by mouth 2 (two) times daily.    [name] CARTIA XT 180 MG 24 hr capsule TAKE 1 CAPSULE EVERY DAY 90 capsule 2  .  Cholecalciferol (VITAMIN D3) 5000 units CAPS Take 5,000 Units by mouth daily.    . Coenzyme Q10 (COQ-10) 200 MG CAPS Take 200 mg by mouth daily.    [name] lisinopril (PRINIVIL,ZESTRIL) 10 MG tablet Take 1 tablet (10 mg total) by mouth daily. 90 tablet 3  . nebivolol (BYSTOLIC) 10 MG tablet Take 1 tablet (10 mg total) by mouth daily. 90 tablet 3  . nitroGLYCERIN (NITROSTAT) 0.4 MG SL tablet Place 1 tablet (0.4 mg total) under the tongue every 5 (five) minutes as needed for chest pain. 25 tablet 3  . Omega-3 Fatty Acids (FISH OIL ULTRA) 1400 MG CAPS Take 1,400 mg by mouth 2 (two) times daily.     No current facility-administered medications for this visit.    Social history is notable in that she is married for 40 years.  She does not have any children.  She lives with her husband.  She completed 12th grade of education.  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  There is no tobacco history or alcohol use.  She does care for many dogs.  Family History  Problem Relation Age of Onset  . Heart failure  Mother   . Sudden death Father   . COPD Father   . Cancer Brother   . Cancer Sister    Family history is notable that her mother died at age 14 with possible congestive heart failure.  Her father died at 11 with sudden death.  He had COPD.  A brother died at 30 with cancer and a sister died at 11 with cancer.   ROS General: Negative; No fevers, chills, or night sweats HEENT: Negative; No changes in vision or hearing, sinus congestion, difficulty swallowing Pulmonary: Negative; No cough, wheezing, shortness of breath, hemoptysis Cardiovascular:  See HPI;  GI: Negative; No nausea, vomiting, diarrhea, or abdominal pain GU: Negative; No dysuria, hematuria, or difficulty voiding Musculoskeletal: Positive for arthritis in her right knee Hematologic/Oncologic: Negative; no easy bruising, bleeding Endocrine: History of partial thyroidectomy Neuro: Negative; no changes in balance, headaches Skin: Negative; No rashes or skin lesions Psychiatric: Prior history of depression, currently stable Sleep: Negative; No daytime sleepiness, hypersomnolence, bruxism, restless legs, hypnogagnic hallucinations Other comprehensive 14 point system review is negative   Physical Exam BP (!) 153/64   Pulse 66   Ht 5' 2" (1.575 m)   Wt 138 lb 12.8 oz (63 kg)   LMP 07/27/1998 (LMP Unknown)   BMI 25.39 kg/m    Repeat blood pressure was 148/70  Wt Readings from Last 3 Encounters:  11/08/17 138 lb 12.8 oz (63 kg)  05/13/17 134 lb (60.8 kg)  03/05/17 132 lb 6.4 oz (60.1 kg)   General: Alert, oriented, no distress.  Skin: normal turgor, no rashes, warm and dry HEENT: Normocephalic, atraumatic. Pupils equal round and reactive to light; sclera anicteric; extraocular muscles intact;  Nose without nasal septal hypertrophy Mouth/Parynx benign; Mallinpatti scale 3 Neck: No JVD, no carotid bruits; normal carotid upstroke Lungs: clear to ausculatation and percussion; no wheezing or rales Chest wall: without  tenderness to palpitation Heart: PMI not displaced, rare ectopy; s1 s2 normal, 1/6 systolic murmur, no diastolic murmur, no rubs, gallops, thrills, or heaves Abdomen: soft, nontender; no hepatosplenomehaly, BS+; abdominal aorta nontender and not dilated by palpation. Back: no CVA tenderness Pulses 2+ Musculoskeletal: full range of motion, normal strength, no joint deformities Extremities: no clubbing cyanosis or edema, Homan's sign negative  Neurologic: grossly nonfocal; Cranial nerves  grossly wnl Psychologic: Normal mood and affect   ECG (independently read by me): Normal sinus rhythm at 66 bpm with occasional PACs.  Left bundle branch block with mild repolarization  October 2018 ECG (independently read by me): Sinus bradycardia 56 bpm.  Left axis deviation.  Left bundle branch block.  QTc interval 424 ms.  August 2018 ECG (independently read by me): Sinus rhythm at 90 bpm with frequent PVCs with transient bigeminy.  May 2018 ECG (independently read by me): Sinus rhythm at 97 bpm with frequent PVCs.  Left bundle branch block.  PR interval 142.  QTc interval 457 ms.  January 2018 ECG (independently read by me): Sinus rhythm at 64 bpm.  Occasional isolated PVC followed by compensatory pause.  QTc interval 44 ms.  PR interval 128 ms.  No ST segment changes.  July 2017 ECG (independently read by me): Sinus bradycardia 56 bpm.  Normal intervals.  Previously noted T-wave changes in leads 3 and aVF.  ECG (independently read by me): sinus bradycardia 57.  Normal intervals.  Nondiagnostic T changes in lead 27 Nov 2014 ECG (independently read by me): Sinus bradycardia 52 bpm.  ST changes improved.  QTc interval 381 ms.  07/26/2014 ECG (independently read by me): Normal sinus rhythm at 68 bpm.  There is mild downsloping ST segment depression in leads II, III, and F V4 through V6, which is slightly more progressive since the patient's prior ECG done in [place].  LABS: Laboratory from  11/11/2015 including a lipid panel and CBC were reviewed.  BMP Latest Ref Rng & Units 09/29/2016 09/18/2016 02/07/2016  Glucose 65 - 99 mg/dL 131(H) 198(H) 123(H)  BUN 6 - 20 mg/dL _0 Creatinine 0.44 - 1.00 mg/dL 1.18(H) 1.32(H) 1.21(H)  Sodium 135 - 145 mmol/L 139 139 140  Potassium 3.5 - 5.1 mmol/L 3.8 4.2 4.7  Chloride 101 - 111 mmol/L 106 104 105  CO2 22 - 32 mmol/L _1 Calcium 8.9 - 10.3 mg/dL 9.1 9.6 10.3   Hepatic Function Latest Ref Rng & Units 09/18/2016 02/07/2016 05/30/2015  Total Protein 6.5 - 8.1 g/dL 6.5 6.8 6.7  Albumin 3.5 - 5.0 g/dL 3.9 4.1 4.3  AST 15 - 41 U/L _2 ALT 14 - 54 U/L 20 15 32(H)  Alk Phosphatase 38 - 126 U/L 59 72 89  Total Bilirubin 0.3 - 1.2 mg/dL 0.6 0.5 0.5  Bilirubin, Direct 0.0 - 0.3 mg/dL - - -   CBC Latest Ref Rng & Units 09/30/2016 09/29/2016 09/18/2016  WBC 4.0 - 10.5 K/uL 14.1(H) 11.4(H) 8.2  Hemoglobin 12.0 - 15.0 g/dL 10.6(L) 11.0(L) 13.0  Hematocrit 36.0 - 46.0 % 32.4(L) 32.3(L) 38.0  Platelets 150 - 400 K/uL 170 168 239   Lab Results  Component Value Date   MCV 89.3 09/30/2016   MCV 88.0 09/29/2016   MCV 87.8 09/18/2016   Lab Results  Component Value Date   TSH 1.471 05/30/2015  No results found for: HGBA1C  Lipid Panel     Component Value Date/Time   CHOL 126 02/14/2015 0400   TRIG 108 02/14/2015 0400   HDL 32 (L) 02/14/2015 0400   CHOLHDL 3.9 02/14/2015 0400   VLDL 22 02/14/2015 0400   LDLCALC 72 02/14/2015 0400   I personally reviewed the blood work done at [facility] from 11/02/2017: Potassium 4.6.  Creatinine 1.27.  Normal LFTs.  Cholesterol 144, triglycerides 90, HDL 43, LDL 82.  TSH 0.96.   RADIOLOGY: No  results found.  IMPRESSION:  1. Palpitations   2. Essential hypertension   3. Paroxysmal SVT (supraventricular tachycardia) ([place])   4. Hyperlipidemia LDL goal <70     ASSESSMENT AND PLAN:  Ms.Petitti is a 67 year old female who has a long-standing history of hypertension, which  initially improved with the addition of amlodipine 5 mg added to her Ziac and lisinopril therapy.  In 2015 she had experienced episodic chest tightness. Cardiac catheterization performed after a nuclear perfusion study raised the possibility of anteroseptal defect  revealed a smooth 40-50% LAD stenosis which did not completely normalize following intracoronary nitroglycerin, and otherwise normal coronary arteries.  She had remained stable without recurrent episodes of chest pain on her current medical therapy.  Lipid studies were still elevated in 2017 and her dose of atorvastatin to 40 mg with improved results. She was hospitalized in [place] and was told of having a stroke.  It sounds as though this was of cerebellar origin based on her description of significant symptoms.  She had an MRI, CT, carotid Doppler studies, and echo Doppler study showed normal ejection fraction at 55-60%.  It was no obvious evidence of a PFO on color Doppler or bubble study. A 2 week event monitor in June 2018 showed dominantly sinus rhythm with occasional PACs.  There were episodes of sinus tachycardia.  There was one episode of PSVT with heart rates of 1 44 bpm.  Of note, there was also an episode of sinus tachycardia at 5:30 AM all.  She was sleeping.  She has requested that she get off the metoprolol due to significant fatigue.  In attempt to improve rate contrpl.  When I last saw her, she was not feeling as well on metoprolol.  Arrhythmia improved with Bystolic.  In addition to Cardizem.  Presently, she is unaware of any palpitations.  Her blood pressure is elevated.  Her ECG does show occasional PACs.  I have recommended additional titration of Bystolic from 5 mg up to 10 mg.  She continues to be on lisinopril 10 mg in addition to long-acting Cardizem 180 mg for blood pressure as well as her ectopy.  She continues to be on atorvastatin 40 mg for hyperlipidemia , as well as over-the-counter omega-3 fatty acid.  She is on  aspirin 81 mg.  She has a history of vitamin D insufficiency and takes 5000 units of vitamin D3 daily.  She will monitor her blood pressure on her increased Bystolic as well as heart rate.  She will follow-up with her primary physician, Dr. Olena Mater.  I will see her in 6 months for reevaluation.  Time spent: 25 minutes Troy Sine, MD, [facility] 11/09/2017 7:03 PM

## 2017-11-09 ENCOUNTER — Encounter: Payer: Self-pay | Admitting: Cardiovascular Disease

## 2017-11-16 NOTE — Addendum Note (Signed)
Addended by: Lorain ChildesPRESSLEY, Marl Seago A on: 11/16/2017 03:29 PM   Modules accepted: Orders

## 2018-01-26 ENCOUNTER — Encounter: Payer: Self-pay | Admitting: Cardiovascular Disease

## 2018-04-10 ENCOUNTER — Other Ambulatory Visit: Payer: Self-pay | Admitting: Cardiovascular Disease

## 2018-05-09 ENCOUNTER — Encounter: Payer: Self-pay | Admitting: Cardiovascular Disease

## 2018-05-09 ENCOUNTER — Ambulatory Visit (INDEPENDENT_AMBULATORY_CARE_PROVIDER_SITE_OTHER): Payer: Medicare Other | Admitting: Cardiovascular Disease

## 2018-05-09 VITALS — BP 164/72 | HR 55 | Ht 62.0 in | Wt 142.2 lb

## 2018-05-09 DIAGNOSIS — I447 Left bundle-branch block, unspecified: Secondary | ICD-10-CM

## 2018-05-09 DIAGNOSIS — I10 Essential (primary) hypertension: Secondary | ICD-10-CM

## 2018-05-09 DIAGNOSIS — E785 Hyperlipidemia, unspecified: Secondary | ICD-10-CM | POA: Diagnosis not present

## 2018-05-09 DIAGNOSIS — I2510 Atherosclerotic heart disease of native coronary artery without angina pectoris: Secondary | ICD-10-CM | POA: Diagnosis not present

## 2018-05-09 DIAGNOSIS — R002 Palpitations: Secondary | ICD-10-CM

## 2018-05-09 DIAGNOSIS — I471 Supraventricular tachycardia: Secondary | ICD-10-CM | POA: Diagnosis not present

## 2018-05-09 DIAGNOSIS — Z79899 Other long term (current) drug therapy: Secondary | ICD-10-CM

## 2018-05-09 MED ORDER — LISINOPRIL 20 MG PO TABS: 20.0000 mg | ORAL_TABLET | Freq: Every day | ORAL | 3 refills | Status: DC

## 2018-05-09 NOTE — Progress Notes (Signed)
Patient ID: [name], female   DOB: 23-Jun-1951, 67 y.o.   MRN: [id number]    HPI:  [name] is a 67 y.o. female who is followed by Dr. Ginette Otto in [place], [place].  Her uncle, [name], is one of my patients.  She presents to the office today for 6 month follow-up cardiology evaluation.   Taeko Schaffer has a history of hypertension dating back for at least 2002, and  recently  has been maintained on lisinopril 10 mg and Ziac 5/6.25 mg daily.  She has experienced episodic chest pressure intermittently and had initially undergone a stress test a  7 years ago.  She also has a history of depression. 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 She had noticed increasing palpitations and also  developed episodes of chest tightness last year. She underwent a nuclear perfusion study in July 2015 She was felt to have a small fixed anteroseptal defect without ischemia.  Ejection fraction was 39%, but was felt that this may not be accurate due to many PVCs during the study.  She subsequently underwent a 2-D echo Doppler study in October 2015 in [place] , which demonstrated an ejection fraction at 55-60%.  There was mild left ventricular posterior wall thickness.  Check equivocal tissue Doppler assessment.  The peak pressure gradient across her aortic valve was 7.6 mm with a mean pressure gradient of 4.3 mm without evidence for significant aortic stenosis.  The patient states that recently she has noticed more chest tightness and this seems to occur with only walking halfway down her driveway.    When I initially saw her,  I added amlodipine 5 mg to help both with blood pressure control as well as potential anti-ischemic benefit.   Due to recurrent symptoms, she underwent a catheterization on 08/03/2014 which revealed smooth 40-50% proximal LAD stenosis after the takeoff of the first diagonal vessel which did not significant normalize following intracoronary nitroglycerin  administration.  Otherwise her coronary anatomy was normal.  She had normal LV function without wall motion abnormalities and evidence for very mild  mitral valve prolapse without significant mitral regurgitation.  She was started on statin therapy with her LDL of 113 attempt to induce plaque stability and regression.  When I last saw her, she was feeling well.  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  She denies any recurrent episodes of chest pain or tightness.  She denies presyncope or syncope.  She was told of having mild osteopenia on bone density imaging.  She underwent a CBC, and lipid study by her primary physician.  Her total cholesterol was 142, HDL 43, LDL 83, and triglycerides 76.  Hemoglobin and hematocrit were 13.2 and 39.1.  She denies any palpitations.She denies any recurrent chest pain.  She does note less shortness of breath.  She is on amlodipine 5 mg Ziac 5/6.25 mg, lisinopril 10 mg daily.  She is now on an increased atorvastatin dose at 40 mg daily.  Lab work from one year ago on 11/08/2014 showed a total cholesterol 236, LDL 163, triglycerides 124.  Her lipid studies have improved under increased atorvastatin dose.    When I last saw  She had noticed an occasional pause when she takes her pulse.  She denies any sensation of tachycardia.  She denies presyncope.  She denies syncope.  There are no episodes of chest pain.  She was  need for right knee surgery to be done by Dr. Delfino Lovett.  She underwent her knee surgery  vessel by Dr. Delfino Lovett on 09/28/2016.  She had developed some swelling initially post procedure.  She was doing well but on 12/11/2016, she developed right arm, hand weakness, nausea and vomiting.  She was hospitalized in [place] and was diagnosed as having a stroke.  Apparently, during that evaluation, she underwent CT imaging, MRI, carotid duplex imaging and appears that she may have had an echo bubble study.Since her discharge, she has noticed significant improvement  in her neurologic capacity.  According to her description, it sounds as though she was significant dysarthric and may have had a cerebellar infarct in the etiology of her presentation.  She was discharged on baby aspirin 81 mg in addition to her lisinopril 5 mg, Toprol XL 25 mg, atorvastatin 40 mg, amlodipine 5 mg.  when I saw her in follow-up at her last office visit, she denied any episodes of chest pain, was unaware of any atrial fibrillation and had noticed episodes of heart rates in the 90s to 100s.  I was able to obtain records from the patient's hospitalization in [place].  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  On 12/14/2014.  An echo Doppler study showed an EF of 55-60% with normal right ventricular size with normal function.  She had mild left atrial dilation, mild MR, aortic sclerosis without stenosis, and mild TR.  She wore an event monitor in June 2018..  This showed sinus rhythm.  She had episodes of sinus tachycardia with PACs, sinus rhythm with PACs, and was an episode of sinus rhythm with PSVT and bigeminal PACs.  Her heart rate had increased to 144 bpm.  During SVT episode.  She also was noted to have sinus tachycardia while sleeping at 5:30 AM for which she was unaware.  When I saw her in follow-up, she had not felt well on the metoprolol and I weaned and ultimately discontinued this and in its place started Bystolic at 5 mg with potential titration to 10 mg as tolerated.  I also discontinued her amlodipine and switch to Cardizem CD 180 mg, both for blood pressure control as well as in attempt to improve rate control.  With this medication adjustment, she has felt significantly better.    When I last saw her in October 2018, her ECG was stable with a ventricular rate at 56 bpm.  Her blood pressure was elevated.  She had felt improved on Bystolic 5 mg in addition to Cardizem CD 180 mg.  I recommended  further titration of lisinopril.    I last saw her in April 2019 and at that time she felt well with reference to palpitations.  Her ECG showed occasional PACs.  Over the past 6 months, she has done well but 3 weeks ago experienced right lower extremity edema which ultimately resolved when she wore compressions stockings.  She been her primary doctor who is scheduled her for Doppler studies but apparently this had never been done and her edema resolved.  She denies any chest pain or episodes of presyncope or syncope.  She presents for evaluation.   Past medical history is notable for hypertension, palpitations, depressive disorder, osteoarthritis.  Past surgical history is notable for partial thyroidectomy while she was in eighth grade, and recent right knee surgery  Current Outpatient Medications  Medication Sig Dispense Refill  . ARTIFICIAL TEAR OP Apply 1 drop to eye daily as needed (dry eyes).    [name] aspirin 81 MG  tablet Take 1 tablet (81 mg total) by mouth 2 (two) times daily after a meal. 60 tablet 1  . atorvastatin (LIPITOR) 40 MG tablet TAKE 1 TABLET EVERY DAY  (KEEP  OFFICE  VISIT) 90 tablet 0  . CALCIUM PO Take 1,200 mg by mouth 2 (two) times daily.    . Coenzyme Q10 (COQ-10) 200 MG CAPS Take 200 mg by mouth daily.    [name] diltiazem (CARDIZEM CD) 180 MG 24 hr capsule TAKE 1 CAPSULE EVERY DAY 90 capsule 0  . lisinopril (PRINIVIL,ZESTRIL) 20 MG tablet Take 1 tablet (20 mg total) by mouth daily. 90 tablet 3  . nebivolol (BYSTOLIC) 10 MG tablet Take 1 tablet (10 mg total) by mouth daily. 90 tablet 3  . nitroGLYCERIN (NITROSTAT) 0.4 MG SL tablet Place 1 tablet (0.4 mg total) under the tongue every 5 (five) minutes as needed for chest pain. 25 tablet 3  . Omega-3 Fatty Acids (FISH OIL ULTRA) 1400 MG CAPS Take 1,400 mg by mouth 2 (two) times daily.     No current facility-administered medications for this visit.    Social history is notable in that she is married for >40 years.  She does not  have any children.  She lives with her husband.  She completed 12th grade of education.  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  There is no tobacco history or alcohol use.  She does care for many dogs.  Family History  Problem Relation Age of Onset  . Heart failure Mother   . Sudden death Father   . COPD Father   . Cancer Brother   . Cancer Sister    Family history is notable that her mother died at age 80 with possible congestive heart failure.  Her father died at 79 with sudden death.  He had COPD.  A brother died at 31 with cancer and a sister died at 36 with cancer.   ROS General: Negative; No fevers, chills, or night sweats HEENT: Negative; No changes in vision or hearing, sinus congestion, difficulty swallowing Pulmonary: Negative; No cough, wheezing, shortness of breath, hemoptysis Cardiovascular:  See HPI;  GI: Negative; No nausea, vomiting, diarrhea, or abdominal pain GU: Negative; No dysuria, hematuria, or difficulty voiding Musculoskeletal: Positive for arthritis in her right knee Hematologic/Oncologic: Negative; no easy bruising, bleeding Endocrine: History of partial thyroidectomy Neuro: Negative; no changes in balance, headaches Skin: Negative; No rashes or skin lesions Psychiatric: Prior history of depression, currently stable Sleep: Negative; No daytime sleepiness, hypersomnolence, bruxism, restless legs, hypnogagnic hallucinations Other comprehensive 14 point system review is negative   Physical Exam BP (!) 164/72   Pulse (!) 55   Ht '5\' 2"'$  (1.575 m)   Wt 142 lb 3.2 oz (64.5 kg)   LMP 07/27/1998 (LMP Unknown)   BMI 26.01 kg/m    Repeat blood pressure by me was 152/78  Wt Readings from Last 3 Encounters:  05/09/18 142 lb 3.2 oz (64.5 kg)  11/08/17 138 lb 12.8 oz (63 kg)  05/13/17 134 lb (60.8 kg)   General: Alert, oriented, no distress.  Skin: normal turgor, no rashes, warm and dry HEENT: Normocephalic,  atraumatic. Pupils equal round and reactive to light; sclera anicteric; extraocular muscles intact;  Nose without nasal septal hypertrophy Mouth/Parynx benign; Mallinpatti scale 3 Neck: No JVD, no carotid bruits; normal carotid upstroke Lungs: clear to ausculatation and percussion; no wheezing or rales Chest wall: without tenderness to palpitation Heart: PMI not displaced,  RRR, s1 s2 normal, 1/6 systolic murmur, no diastolic murmur, no rubs, gallops, thrills, or heaves Abdomen: soft, nontender; no hepatosplenomehaly, BS+; abdominal aorta nontender and not dilated by palpation. Back: no CVA tenderness Pulses 2+ Musculoskeletal: full range of motion, normal strength, no joint deformities Extremities: no clubbing cyanosis or edema, Homan's sign negative  Neurologic: grossly nonfocal; Cranial nerves grossly wnl Psychologic: Normal mood and affect   ECG (independently read by me): Sinus bradycardia 55 bpm.  Left bundle branch block with repolarization changes.  No ectopy.  November 08, 2017 ECG (independently read by me): Normal sinus rhythm at 66 bpm with occasional PACs.  Left bundle branch block with mild repolarization  October 2018 ECG (independently read by me): Sinus bradycardia 56 bpm.  Left axis deviation.  Left bundle branch block.  QTc interval 424 ms.  August 2018 ECG (independently read by me): Sinus rhythm at 90 bpm with frequent PVCs with transient bigeminy.  May 2018 ECG (independently read by me): Sinus rhythm at 97 bpm with frequent PVCs.  Left bundle branch block.  PR interval 142.  QTc interval 457 ms.  January 2018 ECG (independently read by me): Sinus rhythm at 64 bpm.  Occasional isolated PVC followed by compensatory pause.  QTc interval 44 ms.  PR interval 128 ms.  No ST segment changes.  July 2017 ECG (independently read by me): Sinus bradycardia 56 bpm.  Normal intervals.  Previously noted T-wave changes in leads 3 and aVF.  ECG (independently read by me): sinus  bradycardia 57.  Normal intervals.  Nondiagnostic T changes in lead 27 Nov 2014 ECG (independently read by me): Sinus bradycardia 52 bpm.  ST changes improved.  QTc interval 381 ms.  07/26/2014 ECG (independently read by me): Normal sinus rhythm at 68 bpm.  There is mild downsloping ST segment depression in leads II, III, and F V4 through V6, which is slightly more progressive since the patient's prior ECG done in [place].  LABS: Laboratory from 11/11/2015 including a lipid panel and CBC were reviewed.  BMP Latest Ref Rng & Units 09/29/2016 09/18/2016 02/07/2016  Glucose 65 - 99 mg/dL 131(H) 198(H) 123(H)  BUN 6 - 20 mg/dL '13 17 24  '$ Creatinine 0.44 - 1.00 mg/dL 1.18(H) 1.32(H) 1.21(H)  Sodium 135 - 145 mmol/L 139 139 140  Potassium 3.5 - 5.1 mmol/L 3.8 4.2 4.7  Chloride 101 - 111 mmol/L 106 104 105  CO2 22 - 32 mmol/L '24 23 25  '$ Calcium 8.9 - 10.3 mg/dL 9.1 9.6 10.3   Hepatic Function Latest Ref Rng & Units 09/18/2016 02/07/2016 05/30/2015  Total Protein 6.5 - 8.1 g/dL 6.5 6.8 6.7  Albumin 3.5 - 5.0 g/dL 3.9 4.1 4.3  AST 15 - 41 U/L '26 19 30  '$ ALT 14 - 54 U/L 20 15 32(H)  Alk Phosphatase 38 - 126 U/L 59 72 89  Total Bilirubin 0.3 - 1.2 mg/dL 0.6 0.5 0.5  Bilirubin, Direct 0.0 - 0.3 mg/dL - - -   CBC Latest Ref Rng & Units 09/30/2016 09/29/2016 09/18/2016  WBC 4.0 - 10.5 K/uL 14.1(H) 11.4(H) 8.2  Hemoglobin 12.0 - 15.0 g/dL 10.6(L) 11.0(L) 13.0  Hematocrit 36.0 - 46.0 % 32.4(L) 32.3(L) 38.0  Platelets 150 - 400 K/uL 170 168 239   Lab Results  Component Value Date   MCV 89.3 09/30/2016   MCV 88.0 09/29/2016   MCV 87.8 09/18/2016   Lab Results  Component Value Date   TSH 1.471 05/30/2015  No results found for: HGBA1C  Lipid Panel     Component Value Date/Time   CHOL 126 02/14/2015 0400   TRIG 108 02/14/2015 0400   HDL 32 (L) 02/14/2015 0400   CHOLHDL 3.9 02/14/2015 0400   VLDL 22 02/14/2015 0400   LDLCALC 72 02/14/2015 0400   I personally reviewed the blood work done at  clearly in clinic from 11/02/2017: Potassium 4.6.  Creatinine 1.27.  Normal LFTs.  Cholesterol 144, triglycerides 90, HDL 43, LDL 82.  TSH 0.96.   RADIOLOGY: No results found.  IMPRESSION:  1. Palpitations   2. Essential hypertension   3. Hyperlipidemia LDL goal <70   4. Paroxysmal SVT (supraventricular tachycardia) ([place])   5. CAD in native artery   6. Medication management   7. Left bundle branch block     ASSESSMENT AND PLAN:  Ms.Stalzer is a 67 year-old female who has a long-standing history of hypertension, which initially improved with the addition of amlodipine 5 mg added to her Ziac and lisinopril therapy.  In 2015 she had experienced episodic chest tightness. Cardiac catheterization performed after a nuclear perfusion study raised the possibility of anteroseptal defect  revealed a smooth 40-50% LAD stenosis which did not completely normalize following intracoronary nitroglycerin, and otherwise normal coronary arteries.  She had remained stable without recurrent episodes of chest pain on her current medical therapy.  Lipid studies were still elevated in 2017 and her dose of atorvastatin to 40 mg with improved results. She was hospitalized in [place] and was told of having a stroke which was presumed to be of cerebellar origin based on her description of significant symptoms.  She had an MRI, CT, carotid Doppler studies, and echo Doppler study showed normal ejection fraction at 55-60%.  It was no obvious evidence of a PFO on color Doppler or bubble study. A 2 week event monitor in June 2018 showed dominantly sinus rhythm with occasional PACs.  There were episodes of sinus tachycardia.  There was one episode of PSVT with heart rates of 144 bpm.  Of note, there was also an episode of sinus tachycardia at 5:30 AM while sleeping.  In the past due to fatigability on metoprolol she was switched to Bystolic.  Her blood pressure today is elevated despite taking lisinopril 10 mg, Bystolic 10  mg, and diltiazem 180 mg daily.  I recommended titration of lisinopril to 20 mg.  Her ECG today shows sinus rhythm with bradycardia at 55 bpm and chronic left bundle branch block which is stable.  She has not had recent laboratory.  A complete set of fasting labs will be obtained.  Medications will be adjusted.  She continues to be on atorvastatin 40 mg with target LDL less than 70.  She has not had her lipid studies checked in several years.  I will see her in 2 months for reevaluation or sooner if problems arise.  Time spent: 25 minutes Troy Sine, MD, [facility] 05/11/2018 6:42 PM

## 2018-05-09 NOTE — Patient Instructions (Signed)
Medication Instructions:  INCREASE lisinopril to 20 mg daily  If you need a refill on your cardiac medications before your next appointment, please call your pharmacy.   Lab work: Please return for FASTING labs in the next couple of weeks (CMET, CBC, Lipid, TSH)  Our in office lab hours are Monday-Friday 8:00-4:00, closed for lunch 12:45-1:45 pm.  No appointment needed.  If you have labs (blood work) drawn today and your tests are completely normal, you will receive your results only by: [name] MyChart Message (if you have MyChart) OR . A paper copy in the mail If you have any lab test that is abnormal or we need to change your treatment, we will call you to review the results.  Follow-Up: At [facility], you and your health needs are our priority.  As part of our continuing mission to provide you with exceptional heart care, we have created designated Provider Care Teams.  These Care Teams include your primary Cardiologist (physician) and Advanced Practice Providers (APPs -  Physician Assistants and Nurse Practitioners) who all work together to provide you with the care you need, when you need it. You will need a follow up appointment in 2 months.  Please call our office 2 months in advance to schedule this appointment.  You may see Dr. Tresa Endo or one of the following Advanced Practice Providers on your designated Care Team: [place], [place] . Micah Flesher, PA-C

## 2018-05-11 ENCOUNTER — Encounter: Payer: Self-pay | Admitting: Cardiovascular Disease

## 2018-06-28 ENCOUNTER — Other Ambulatory Visit: Payer: Self-pay | Admitting: Cardiovascular Disease

## 2018-08-08 ENCOUNTER — Encounter: Payer: Self-pay | Admitting: Cardiovascular Disease

## 2018-08-08 ENCOUNTER — Ambulatory Visit (INDEPENDENT_AMBULATORY_CARE_PROVIDER_SITE_OTHER): Payer: Medicare Other | Admitting: Cardiovascular Disease

## 2018-08-08 VITALS — BP 168/58 | HR 59 | Ht 62.0 in | Wt 145.6 lb

## 2018-08-08 DIAGNOSIS — R600 Localized edema: Secondary | ICD-10-CM

## 2018-08-08 DIAGNOSIS — I2510 Atherosclerotic heart disease of native coronary artery without angina pectoris: Secondary | ICD-10-CM

## 2018-08-08 DIAGNOSIS — E785 Hyperlipidemia, unspecified: Secondary | ICD-10-CM | POA: Diagnosis not present

## 2018-08-08 DIAGNOSIS — I471 Supraventricular tachycardia: Secondary | ICD-10-CM

## 2018-08-08 DIAGNOSIS — I639 Cerebral infarction, unspecified: Secondary | ICD-10-CM

## 2018-08-08 DIAGNOSIS — I10 Essential (primary) hypertension: Secondary | ICD-10-CM | POA: Diagnosis not present

## 2018-08-08 DIAGNOSIS — I447 Left bundle-branch block, unspecified: Secondary | ICD-10-CM

## 2018-08-08 MED ORDER — HYDROCHLOROTHIAZIDE 12.5 MG PO CAPS: 12.5000 mg | ORAL_CAPSULE | ORAL | 3 refills | Status: DC | PRN

## 2018-08-08 MED ORDER — LISINOPRIL 30 MG PO TABS: 30.0000 mg | ORAL_TABLET | Freq: Every day | ORAL | 1 refills | Status: DC

## 2018-08-08 NOTE — Patient Instructions (Signed)
Medication Instructions:  Increase Lisinopril to 30 mg daily. Start HCTZ 12.5 mg as needed for swelling. If you need a refill on your cardiac medications before your next appointment, please call your pharmacy.   Lab work: Repeat BMET in 4 weeks If you have labs (blood work) drawn today and your tests are completely normal, you will receive your results only by: [name] MyChart Message (if you have MyChart) OR . A paper copy in the mail If you have any lab test that is abnormal or we need to change your treatment, we will call you to review the results.   Follow-Up: At [facility], you and your health needs are our priority.  As part of our continuing mission to provide you with exceptional heart care, we have created designated Provider Care Teams.  These Care Teams include your primary Cardiologist (physician) and Advanced Practice Providers (APPs -  Physician Assistants and Nurse Practitioners) who all work together to provide you with the care you need, when you need it. You will need a follow up appointment in 3 months. You may see Dr.Kelly or one of the following Advanced Practice Providers on your designated Care Team: [place]e [place], [place] . Micah Flesher, PA-C

## 2018-08-08 NOTE — Progress Notes (Signed)
Patient ID: [name], female   DOB: [birth year], 68 y.o.   MRN: [id number]    HPI:  [name] is a 68 y.o. female who is followed by Dr. Ginette Otto in [place], [place].  Her uncle, [name], is one of my patients.  She presents to the office today for 3 month follow-up cardiology evaluation.   [name] has a history of hypertension dating back for at least 2002, and  recently  has been maintained on lisinopril 10 mg and Ziac 5/6.25 mg daily.  She has experienced episodic chest pressure intermittently and had initially undergone a stress test a  7 years ago.  She also has a history of depression. 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 She had noticed increasing palpitations and also  developed episodes of chest tightness last year. She underwent a nuclear perfusion study in July 2015 She was felt to have a small fixed anteroseptal defect without ischemia.  Ejection fraction was 39%, but was felt that this may not be accurate due to many PVCs during the study.  She subsequently underwent a 2-D echo Doppler study in October 2015 in [place] , which demonstrated an ejection fraction at 55-60%.  There was mild left ventricular posterior wall thickness.  Check equivocal tissue Doppler assessment.  The peak pressure gradient across her aortic valve was 7.6 mm with a mean pressure gradient of 4.3 mm without evidence for significant aortic stenosis.  The patient states that recently she has noticed more chest tightness and this seems to occur with only walking halfway down her driveway.    When I initially saw her,  I added amlodipine 5 mg to help both with blood pressure control as well as potential anti-ischemic benefit.   Due to recurrent symptoms, she underwent a catheterization on 08/03/2014 which revealed smooth 40-50% proximal LAD stenosis after the takeoff of the first diagonal vessel which did not significant normalize following intracoronary nitroglycerin  administration.  Otherwise her coronary anatomy was normal.  She had normal LV function without wall motion abnormalities and evidence for very mild  mitral valve prolapse without significant mitral regurgitation.  She was started on statin therapy with her LDL of 113 attempt to induce plaque stability and regression.  When I last saw her, she was feeling well.  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  She denies any recurrent episodes of chest pain or tightness.  She denies presyncope or syncope.  She was told of having mild osteopenia on bone density imaging.  She underwent a CBC, and lipid study by her primary physician.  Her total cholesterol was 142, HDL 43, LDL 83, and triglycerides 76.  Hemoglobin and hematocrit were 13.2 and 39.1.  She denies any palpitations.She denies any recurrent chest pain.  She does note less shortness of breath.  She is on amlodipine 5 mg Ziac 5/6.25 mg, lisinopril 10 mg daily.  She is now on an increased atorvastatin dose at 40 mg daily.  Lab work from one year ago on 11/08/2014 showed a total cholesterol 236, LDL 163, triglycerides 124.  Her lipid studies have improved under increased atorvastatin dose.    When I last saw  She had noticed an occasional pause when she takes her pulse.  She denies any sensation of tachycardia.  She denies presyncope.  She denies syncope.  There are no episodes of chest pain.  She was  need for right knee surgery to be done by Dr. Delfino Kaiser.  She underwent her knee surgery  vessel by Dr. Delfino Kaiser on 09/28/2016.  She had developed some swelling initially post procedure.  She was doing well but on 12/11/2016, she developed right arm, hand weakness, nausea and vomiting.  She was hospitalized in [place] and was diagnosed as having a stroke.  Apparently, during that evaluation, she underwent CT imaging, MRI, carotid duplex imaging and appears that she may have had an echo bubble study.Since her discharge, she has noticed significant improvement  in her neurologic capacity.  According to her description, it sounds as though she was significant dysarthric and may have had a cerebellar infarct in the etiology of her presentation.  She was discharged on baby aspirin 81 mg in addition to her lisinopril 5 mg, Toprol XL 25 mg, atorvastatin 40 mg, amlodipine 5 mg.  when I saw her in follow-up at her last office visit, she denied any episodes of chest pain, was unaware of any atrial fibrillation and had noticed episodes of heart rates in the 90s to 100s.  I was able to obtain records from the patient's hospitalization in [place].  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  On 12/14/2014.  An echo Doppler study showed an EF of 55-60% with normal right ventricular size with normal function.  She had mild left atrial dilation, mild MR, aortic sclerosis without stenosis, and mild TR.  She wore an event monitor in June 2018..  This showed sinus rhythm.  She had episodes of sinus tachycardia with PACs, sinus rhythm with PACs, and was an episode of sinus rhythm with PSVT and bigeminal PACs.  Her heart rate had increased to 144 bpm.  During SVT episode.  She also was noted to have sinus tachycardia while sleeping at 5:30 AM for which she was unaware.  When I saw her in follow-up, she had not felt well on the metoprolol and I weaned and ultimately discontinued this and in its place started Bystolic at 5 mg with potential titration to 10 mg as tolerated.  I also discontinued her amlodipine and switch to Cardizem CD 180 mg, both for blood pressure control as well as in attempt to improve rate control.  With this medication adjustment, she has felt significantly better.    When I last saw her in October 2018, her ECG was stable with a ventricular rate at 56 bpm.  Her blood pressure was elevated.  She had felt improved on Bystolic 5 mg in addition to Cardizem CD 180 mg.  I recommended  further titration of lisinopril.    I  saw her in April 2019 and at that time she felt well with reference to palpitations.  Her ECG showed occasional PACs.    I last saw her in October 2019 and had noted the development of  right lower extremity edema which ultimately resolved when she wore compressions stockings.  She been her primary doctor who is scheduled her for Doppler studies but apparently this had never been done and her edema resolved.  She denies any chest pain or episodes of presyncope or syncope.   Since I last saw her, she has had some issues with urinary incontinence.  She was given a prescription for Myrbetriq but she never filled this.  He was concerned about side effects.  She continues to experience right lower extremity swelling occasionally.  Her blood pressure has been elevated.  She denies chest pain or palpitations, PND orthopnea.  She presents for evaluation.  Past medical history is notable for hypertension, palpitations, depressive disorder, osteoarthritis.  Past surgical history is notable for partial thyroidectomy while she was in eighth grade, and recent right knee surgery  Current Outpatient Medications  Medication Sig Dispense Refill  . ARTIFICIAL TEAR OP Apply 1 drop to eye daily as needed (dry eyes).    [name] aspirin 81 MG tablet Take 1 tablet (81 mg total) by mouth 2 (two) times daily after a meal. 60 tablet 1  . atorvastatin (LIPITOR) 40 MG tablet Take 1 tablet (40 mg total) by mouth daily. 90 tablet 3  . CALCIUM PO Take 1,200 mg by mouth 2 (two) times daily.    . Coenzyme Q10 (COQ-10) 200 MG CAPS Take 200 mg by mouth daily.    [name] diltiazem (CARDIZEM CD) 180 MG 24 hr capsule Take 1 capsule (180 mg total) by mouth daily. 90 capsule 3  . lisinopril (PRINIVIL,ZESTRIL) 30 MG tablet Take 1 tablet (30 mg total) by mouth daily. 90 tablet 1  . nebivolol (BYSTOLIC) 10 MG tablet Take 1 tablet (10 mg total) by mouth daily. 90 tablet 3  . nitroGLYCERIN (NITROSTAT) 0.4 MG  SL tablet Place 1 tablet (0.4 mg total) under the tongue every 5 (five) minutes as needed for chest pain. 25 tablet 3  . Omega-3 Fatty Acids (FISH OIL ULTRA) 1400 MG CAPS Take 1,400 mg by mouth 2 (two) times daily.    . hydrochlorothiazide (MICROZIDE) 12.5 MG capsule Take 1 capsule (12.5 mg total) by mouth as needed (for swelling). 90 capsule 3   No current facility-administered medications for this visit.    Social history is notable in that she is married for >40 years.  She does not have any children.  She lives with her husband.  She completed 12th grade of education.  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  There is no tobacco history or alcohol use.  She does care for many dogs.  Family History  Problem Relation Age of Onset  . Heart failure Mother   . Sudden death Father   . COPD Father   . Cancer Brother   . Cancer Sister    Family history is notable that her mother died at age 34 with possible congestive heart failure.  Her father died at 46 with sudden death.  He had COPD.  A brother died at 73 with cancer and a sister died at 75 with cancer.   ROS General: Negative; No fevers, chills, or night sweats HEENT: Negative; No changes in vision or hearing, sinus congestion, difficulty swallowing Pulmonary: Negative; No cough, wheezing, shortness of breath, hemoptysis Cardiovascular:  See HPI;  GI: Negative; No nausea, vomiting, diarrhea, or abdominal pain GU: Occasional urinary incontinence Musculoskeletal: Positive for arthritis in her right knee Hematologic/Oncologic: Negative; no easy bruising, bleeding Endocrine: History of partial thyroidectomy Neuro: Negative; no changes in balance, headaches Skin: Negative; No rashes or skin lesions Psychiatric: Prior history of depression, currently stable Sleep: Negative; No daytime sleepiness, hypersomnolence, bruxism, restless legs, hypnogagnic hallucinations Other comprehensive 14 point  system review is negative   Physical Exam BP (!) 168/58   Pulse (!) 59   Ht '5\' 2"'$  (1.575 m)   Wt 145 lb 9.6 oz (66 kg)   LMP 07/27/1998 (LMP Unknown)   BMI 26.63 kg/m    Repeat blood pressure by me was elevated at 160/64  Wt Readings from Last 3 Encounters:  08/08/18 145 lb 9.6 oz (66 kg)  05/09/18 142 lb 3.2 oz (64.5 kg)  11/08/17 138 lb 12.8 oz (63 kg)   General: Alert, oriented, no distress.  Skin: normal turgor, no rashes, warm and dry HEENT: Normocephalic, atraumatic. Pupils equal round and reactive to light; sclera anicteric; extraocular muscles intact;  Nose without nasal septal hypertrophy Mouth/Parynx benign; Mallinpatti scale 3 Neck: No JVD, no carotid bruits; normal carotid upstroke Lungs: clear to ausculatation and percussion; no wheezing or rales Chest wall: without tenderness to palpitation Heart: PMI not displaced, RRR, s1 s2 normal, 1/6 systolic murmur, no diastolic murmur, no rubs, gallops, thrills, or heaves Abdomen: soft, nontender; no hepatosplenomehaly, BS+; abdominal aorta nontender and not dilated by palpation. Back: no CVA tenderness Pulses 2+ Musculoskeletal: full range of motion, normal strength, no joint deformities Extremities: Trace residual right lower extremity edema, none on the left.  No clubbing, cyanosis; no palpable cord, negative Homan's sign   Neurologic: grossly nonfocal; Cranial nerves grossly wnl Psychologic: Normal mood and affect   ECG (independently read by me): Sinus bradycardia 59 bpm.  Left bundle branch block with repolarization changes.  May 09, 2018 ECG (independently read by me): Sinus bradycardia 55 bpm.  Left bundle branch block with repolarization changes.  No ectopy.  November 08, 2017 ECG (independently read by me): Normal sinus rhythm at 66 bpm with occasional PACs.  Left bundle branch block with mild repolarization  October 2018 ECG (independently read by me): Sinus bradycardia 56 bpm.  Left axis deviation.  Left  bundle branch block.  QTc interval 424 ms.  August 2018 ECG (independently read by me): Sinus rhythm at 90 bpm with frequent PVCs with transient bigeminy.  May 2018 ECG (independently read by me): Sinus rhythm at 97 bpm with frequent PVCs.  Left bundle branch block.  PR interval 142.  QTc interval 457 ms.  January 2018 ECG (independently read by me): Sinus rhythm at 64 bpm.  Occasional isolated PVC followed by compensatory pause.  QTc interval 44 ms.  PR interval 128 ms.  No ST segment changes.  July 2017 ECG (independently read by me): Sinus bradycardia 56 bpm.  Normal intervals.  Previously noted T-wave changes in leads 3 and aVF.  ECG (independently read by me): sinus bradycardia 57.  Normal intervals.  Nondiagnostic T changes in lead 27 Nov 2014 ECG (independently read by me): Sinus bradycardia 52 bpm.  ST changes improved.  QTc interval 381 ms.  07/26/2014 ECG (independently read by me): Normal sinus rhythm at 68 bpm.  There is mild downsloping ST segment depression in leads II, III, and F V4 through V6, which is slightly more progressive since the patient's prior ECG done in [place].  LABS: Laboratory from 11/11/2015 including a lipid panel and CBC were reviewed.  BMP Latest Ref Rng & Units 09/29/2016 09/18/2016 02/07/2016  Glucose 65 - 99 mg/dL 131(H) 198(H) 123(H)  BUN 6 - 20 mg/dL '13 17 24  '$ Creatinine 0.44 - 1.00 mg/dL 1.18(H) 1.32(H) 1.21(H)  Sodium 135 - 145 mmol/L 139 139 140  Potassium 3.5 - 5.1 mmol/L 3.8 4.2 4.7  Chloride 101 - 111 mmol/L 106 104 105  CO2 22 - 32 mmol/L '24 23 25  '$ Calcium 8.9 - 10.3 mg/dL 9.1 9.6 10.3   Hepatic Function Latest Ref Rng & Units 09/18/2016 02/07/2016 05/30/2015  Total Protein 6.5 - 8.1 g/dL 6.5 6.8 6.7  Albumin 3.5 - 5.0 g/dL 3.9 4.1 4.3  AST 15 - 41 U/L '26 19 30  '$ ALT 14 - 54 U/L 20 15 32(H)  Alk Phosphatase 38 - 126 U/L 59 72 89  Total Bilirubin 0.3 - 1.2 mg/dL 0.6 0.5 0.5  Bilirubin, Direct 0.0 - 0.3 mg/dL - - -   CBC Latest Ref Rng &  Units 09/30/2016 09/29/2016 09/18/2016  WBC 4.0 - 10.5 K/uL 14.1(H) 11.4(H) 8.2  Hemoglobin 12.0 - 15.0 g/dL 10.6(L) 11.0(L) 13.0  Hematocrit 36.0 - 46.0 % 32.4(L) 32.3(L) 38.0  Platelets 150 - 400 K/uL 170 168 239   Lab Results  Component Value Date   MCV 89.3 09/30/2016   MCV 88.0 09/29/2016   MCV 87.8 09/18/2016   Lab Results  Component Value Date   TSH 1.471 05/30/2015  No results found for: HGBA1C  Lipid Panel     Component Value Date/Time   CHOL 126 02/14/2015 0400   TRIG 108 02/14/2015 0400   HDL 32 (L) 02/14/2015 0400   CHOLHDL 3.9 02/14/2015 0400   VLDL 22 02/14/2015 0400   LDLCALC 72 02/14/2015 0400   I personally reviewed the blood work done at [facility] from 11/02/2017: Potassium 4.6.  Creatinine 1.27.  Normal LFTs.  Cholesterol 144, triglycerides 90, HDL 43, LDL 82.  TSH 0.96.   RADIOLOGY: No results found.  IMPRESSION:  1. Essential hypertension   2. CAD in native artery   3. Hyperlipidemia LDL goal <70   4. Leg edema, right   5. Left bundle branch block   6. History of Cerebrovascular accident (CVA), unspecified mechanism ([place])   7. H/O Paroxysmal SVT (supraventricular tachycardia) ([facility])     ASSESSMENT AND PLAN:  [name] is a 68 year-old female who has a long-standing history of hypertension, which initially improved with the addition of amlodipine 5 mg added to her Ziac and lisinopril therapy.  In 2015 she had experienced episodic chest tightness. Cardiac catheterization performed after a nuclear perfusion study raised the possibility of anteroseptal defect  revealed a smooth 40-50% LAD stenosis which did not completely normalize following intracoronary nitroglycerin, and otherwise normal coronary arteries.  She had remained stable without recurrent episodes of chest pain on her current medical therapy.  Lipid studies were still elevated in 2017 and her dose of atorvastatin to 40 mg with improved results. She was hospitalized in [place] and  was told of having a stroke which was presumed to be of cerebellar origin based on her description of significant symptoms.  She had an MRI, CT, carotid Doppler studies, and echo Doppler study showed normal ejection fraction at 55-60%.  There was no obvious evidence of a PFO on color Doppler or bubble study. A 2 week event monitor in June 2018 showed dominantly sinus rhythm with occasional PACs.  There were episodes of sinus tachycardia.  There was one episode of PSVT with heart rates of 144 bpm.  Of note, there was also an episode of sinus tachycardia at 5:30 AM while sleeping.  In the past due to fatigability on metoprolol she was switched to Bystolic.  Blood pressure today continues to be elevated despite taking diltiazem 180 mg daily, lisinopril 20 mg, and Bystolic 10 mg.  I am recommending further titration of lisinopril to 30 mg.  She already is in sinus bradycardia and for this reason I will not increase Bystolic or diltiazem.  She also has experienced intermittent right lower extremity edema.  I have recommended HCTZ to take on an as-needed basis as well.  She continues to be on atorvastatin for hyperlipidemia with target LDL less than 70.  I reviewed laboratory done in [place] from October  2019.  Creatinine at that time was 1.25.  Lipid studies were excellent with total cholesterol 115, LDL cholesterol 59, triglycerides 79 and HDL 40.  We will continue to monitor her blood pressure.  I have recommended a follow-up be met in 4 weeks to reassess renal function with her increase lisinopril.  If renal function remained stable and blood pressure is still elevated further titration to lisinopril 40 mg may be necessary.  I will see her in 3 months for reevaluation.  Time spent: 25 minutes Sarah Sine, MD, [facility] 08/09/2018 2:35 PM

## 2018-08-09 ENCOUNTER — Encounter: Payer: Self-pay | Admitting: Cardiovascular Disease

## 2018-08-26 LAB — BASIC METABOLIC PANEL
BUN/Creatinine Ratio: 12 (ref 12–28)
BUN: 15 mg/dL (ref 8–27)
CO2: 21 mmol/L (ref 20–29)
Calcium: 9.4 mg/dL (ref 8.7–10.3)
Chloride: 101 mmol/L (ref 96–106)
Creatinine, Ser: 1.27 mg/dL — ABNORMAL HIGH (ref 0.57–1.00)
GFR calc Af Amer: 50 mL/min/{1.73_m2} — ABNORMAL LOW (ref >59)
GFR calc non Af Amer: 43 mL/min/{1.73_m2} — ABNORMAL LOW (ref >59)
Glucose: 114 mg/dL — ABNORMAL HIGH (ref 65–99)
POTASSIUM: 4.5 mmol/L (ref 3.5–5.2)
SODIUM: 139 mmol/L (ref 134–144)

## 2018-08-30 ENCOUNTER — Telehealth: Payer: Self-pay | Admitting: Cardiovascular Disease

## 2018-08-30 NOTE — Telephone Encounter (Signed)
Patient returned call for lab results.  

## 2018-08-30 NOTE — Telephone Encounter (Signed)
Called patient, gave lab results,. Patient verbalized understanding.

## 2018-09-14 ENCOUNTER — Other Ambulatory Visit: Payer: Self-pay | Admitting: Cardiovascular Disease

## 2018-09-14 NOTE — Telephone Encounter (Signed)
Rx(s) sent to pharmacy electronically.  

## 2018-11-09 ENCOUNTER — Ambulatory Visit: Payer: Medicare Other | Admitting: Cardiovascular Disease

## 2019-02-06 ENCOUNTER — Encounter: Payer: Self-pay | Admitting: Cardiovascular Disease

## 2019-02-06 ENCOUNTER — Other Ambulatory Visit: Payer: Self-pay

## 2019-02-06 ENCOUNTER — Ambulatory Visit (INDEPENDENT_AMBULATORY_CARE_PROVIDER_SITE_OTHER): Payer: Medicare Other | Admitting: Cardiovascular Disease

## 2019-02-06 VITALS — BP 186/78 | HR 56 | Temp 97.2°F | Ht 62.0 in | Wt 146.2 lb

## 2019-02-06 DIAGNOSIS — E785 Hyperlipidemia, unspecified: Secondary | ICD-10-CM

## 2019-02-06 DIAGNOSIS — I2510 Atherosclerotic heart disease of native coronary artery without angina pectoris: Secondary | ICD-10-CM

## 2019-02-06 DIAGNOSIS — Z79899 Other long term (current) drug therapy: Secondary | ICD-10-CM | POA: Diagnosis not present

## 2019-02-06 DIAGNOSIS — I639 Cerebral infarction, unspecified: Secondary | ICD-10-CM

## 2019-02-06 DIAGNOSIS — I10 Essential (primary) hypertension: Secondary | ICD-10-CM | POA: Diagnosis not present

## 2019-02-06 DIAGNOSIS — I471 Supraventricular tachycardia: Secondary | ICD-10-CM

## 2019-02-06 MED ORDER — LISINOPRIL 40 MG PO TABS: 40.0000 mg | ORAL_TABLET | Freq: Every day | ORAL | 1 refills | Status: DC

## 2019-02-06 MED ORDER — CARVEDILOL 12.5 MG PO TABS: 12.5000 mg | ORAL_TABLET | Freq: Two times a day (BID) | ORAL | 3 refills | Status: DC

## 2019-02-06 NOTE — Patient Instructions (Signed)
Medication Instructions:  STOP Bystolic  Start Carvedilol 12.5 twice daily. Increase Lisinopril to 40 mg daily. Monitor BP is above 140 use HCTZ 12.5 as needed.   If you need a refill on your cardiac medications before your next appointment, please call your pharmacy.   Lab work: Fasting lab work (CBC, CMET, TSH, LIPID)  Attached are the lab orders that are needed before your upcoming appointment, please come in before 3 month followup anytime to have your labs drawn.   They are fasting labs, so nothing to eat or drink after midnight.  Lab hours: 8:00-4:00 lunch hours 12:45-1:45  If you have labs (blood work) drawn today and your tests are completely normal, you will receive your results only by: [name] MyChart Message (if you have MyChart) OR . A paper copy in the mail If you have any lab test that is abnormal or we need to change your treatment, we will call you to review the results  Follow-Up: At [facility], you and your health needs are our priority.  As part of our continuing mission to provide you with exceptional heart care, we have created designated Provider Care Teams.  These Care Teams include your primary Cardiologist (physician) and Advanced Practice Providers (APPs -  Physician Assistants and Nurse Practitioners) who all work together to provide you with the care you need, when you need it. You will need a follow up appointment in 3 months. You may see Dr.Kelly or one of the following Advanced Practice Providers on your designated Care Team: Almyra Deforest, [place] . Fabian Sharp, PA-C

## 2019-02-06 NOTE — Progress Notes (Signed)
Patient ID: [name], female   DOB: 01/08/1951, 68 y.o.   MRN: [id number]    HPI:  [name] is a 68 y.o. female who is followed by Dr. Ginette Otto in [place], [place].  Her uncle, [name], is one of my patients.  She presents to the office today for 6 month follow-up cardiology evaluation.   [name] has a history of hypertension dating back for at least 2002, and  recently  has been maintained on lisinopril 10 mg and Ziac 5/6.25 mg daily.  She has experienced episodic chest pressure intermittently and had initially undergone a stress test a  7 years ago.  She also has a history of depression. 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 She had noticed increasing palpitations and also  developed episodes of chest tightness last year. She underwent a nuclear perfusion study in July 2015 She was felt to have a small fixed anteroseptal defect without ischemia.  Ejection fraction was 39%, but was felt that this may not be accurate due to many PVCs during the study.  She subsequently underwent a 2-D echo Doppler study in October 2015 in [place] , which demonstrated an ejection fraction at 55-60%.  There was mild left ventricular posterior wall thickness.  Check equivocal tissue Doppler assessment.  The peak pressure gradient across her aortic valve was 7.6 mm with a mean pressure gradient of 4.3 mm without evidence for significant aortic stenosis.  The patient states that recently she has noticed more chest tightness and this seems to occur with only walking halfway down her driveway.    When I initially saw her,  I added amlodipine 5 mg to help both with blood pressure control as well as potential anti-ischemic benefit.   Due to recurrent symptoms, she underwent a catheterization on 08/03/2014 which revealed smooth 40-50% proximal LAD stenosis after the takeoff of the first diagonal vessel which did not significant normalize following intracoronary nitroglycerin  administration.  Otherwise her coronary anatomy was normal.  She had normal LV function without wall motion abnormalities and evidence for very mild  mitral valve prolapse without significant mitral regurgitation.  She was started on statin therapy with her LDL of 113 attempt to induce plaque stability and regression.  When I last saw her, she was feeling well.  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  She denies any recurrent episodes of chest pain or tightness.  She denies presyncope or syncope.  She was told of having mild osteopenia on bone density imaging.  She underwent a CBC, and lipid study by her primary physician.  Her total cholesterol was 142, HDL 43, LDL 83, and triglycerides 76.  Hemoglobin and hematocrit were 13.2 and 39.1.  She denies any palpitations.She denies any recurrent chest pain.  She does note less shortness of breath.  She is on amlodipine 5 mg Ziac 5/6.25 mg, lisinopril 10 mg daily.  She is now on an increased atorvastatin dose at 40 mg daily.  Lab work from one year ago on 11/08/2014 showed a total cholesterol 236, LDL 163, triglycerides 124.  Her lipid studies have improved under increased atorvastatin dose.    When I last saw  She had noticed an occasional pause when she takes her pulse.  She denies any sensation of tachycardia.  She denies presyncope.  She denies syncope.  There are no episodes of chest pain.  She was  need for right knee surgery to be done by Dr. Delfino Lovett.  She underwent her knee surgery  vessel by Dr. Delfino Lovett on 09/28/2016.  She had developed some swelling initially post procedure.  She was doing well but on 12/11/2016, she developed right arm, hand weakness, nausea and vomiting.  She was hospitalized in [place] and was diagnosed as having a stroke.  Apparently, during that evaluation, she underwent CT imaging, MRI, carotid duplex imaging and appears that she may have had an echo bubble study.Since her discharge, she has noticed significant improvement  in her neurologic capacity.  According to her description, it sounds as though she was significant dysarthric and may have had a cerebellar infarct in the etiology of her presentation.  She was discharged on baby aspirin 81 mg in addition to her lisinopril 5 mg, Toprol XL 25 mg, atorvastatin 40 mg, amlodipine 5 mg.  when I saw her in follow-up at her last office visit, she denied any episodes of chest pain, was unaware of any atrial fibrillation and had noticed episodes of heart rates in the 90s to 100s.  I was able to obtain records from the patient's hospitalization in [place].  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  On 12/14/2014.  An echo Doppler study showed an EF of 55-60% with normal right ventricular size with normal function.  She had mild left atrial dilation, mild MR, aortic sclerosis without stenosis, and mild TR.  She wore an event monitor in June 2018..  This showed sinus rhythm.  She had episodes of sinus tachycardia with PACs, sinus rhythm with PACs, and was an episode of sinus rhythm with PSVT and bigeminal PACs.  Her heart rate had increased to 144 bpm.  During SVT episode.  She also was noted to have sinus tachycardia while sleeping at 5:30 AM for which she was unaware.  When I saw her in follow-up, she had not felt well on the metoprolol and I weaned and ultimately discontinued this and in its place started Bystolic at 5 mg with potential titration to 10 mg as tolerated.  I also discontinued her amlodipine and switch to Cardizem CD 180 mg, both for blood pressure control as well as in attempt to improve rate control.  With this medication adjustment, she has felt significantly better.    When I last saw her in October 2018, her ECG was stable with a ventricular rate at 56 bpm.  Her blood pressure was elevated.  She had felt improved on Bystolic 5 mg in addition to Cardizem CD 180 mg.  I recommended  further titration of lisinopril.    I  saw her in April 2019 and at that time she felt well with reference to palpitations.  Her ECG showed occasional PACs.    I last saw her in October 2019 and had noted the development of  right lower extremity edema which ultimately resolved when she wore compressions stockings.  She been her primary doctor who is scheduled her for Doppler studies but apparently this had never been done and her edema resolved.  She denies any chest pain or episodes of presyncope or syncope.   When I last saw her in January 2020 she had some issues with urinary incontinence.  She had been given a prescription for Myrbetriq by he physician but she never filled this due to conmcerns about side effects.  She continued to experience right lower extremity swelling occasionally.  Her blood pressure was elevated and I recommended further titration of lisinopril to 30 mg.  I also recommended HCTZ to take on an as-needed basis in light of her leg swelling.  Subsequent laboratory showed a BUN of 15 creatinine 1.27 on August 26, 2018.  Presently, she has been feeling well.  She denies chest pain PND orthopnea.  She was feeling that she was noticing some sinus congestion when she took a DayQuil earlier this morning.  She brought with her her blood pressure log.  Her blood pressure has been labile and at times has been in the 160s and other times in the 110s to 120s.  She denies any significant leg swelling.  She has not taken the HCTZ.  She can no longer afford Bystolic and would like to switch and according to her insurance her options are metoprolol tartrate or carvedilol.  In the past she did not tolerate metoprolol due to fatigability.  She presents for evaluation.  Past medical history is notable for hypertension, palpitations, depressive disorder, osteoarthritis.  Past surgical history is notable for partial thyroidectomy while she was in eighth grade, and recent right knee  surgery  Current Outpatient Medications  Medication Sig Dispense Refill   ARTIFICIAL TEAR OP Apply 1 drop to eye daily as needed (dry eyes).     aspirin 81 MG tablet Take 1 tablet (81 mg total) by mouth 2 (two) times daily after a meal. 60 tablet 1   atorvastatin (LIPITOR) 40 MG tablet Take 1 tablet (40 mg total) by mouth daily. 90 tablet 3   CALCIUM PO Take 1,200 mg by mouth 2 (two) times daily.     Coenzyme Q10 (COQ-10) 200 MG CAPS Take 200 mg by mouth daily.     diltiazem (CARDIZEM CD) 180 MG 24 hr capsule Take 1 capsule (180 mg total) by mouth daily. 90 capsule 3   lisinopril (ZESTRIL) 40 MG tablet Take 1 tablet (40 mg total) by mouth daily. 90 tablet 1   nitroGLYCERIN (NITROSTAT) 0.4 MG SL tablet Place 1 tablet (0.4 mg total) under the tongue every 5 (five) minutes as needed for chest pain. 25 tablet 3   Omega-3 Fatty Acids (FISH OIL ULTRA) 1400 MG CAPS Take 1,400 mg by mouth 2 (two) times daily.     carvedilol (COREG) 12.5 MG tablet Take 1 tablet (12.5 mg total) by mouth 2 (two) times daily. 180 tablet 3   hydrochlorothiazide (MICROZIDE) 12.5 MG capsule Take 1 capsule (12.5 mg total) by mouth as needed (for swelling). 90 capsule 3   No current facility-administered medications for this visit.    Social history is notable in that she is married for >40 years.  She does not have any children.  She lives with her husband.  She completed 12th grade of education.  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  There is no tobacco history or alcohol use.  She does care for many dogs.  Family History  Problem Relation Age of Onset   Heart failure Mother    Sudden death Father    COPD Father    Cancer Brother    Cancer Sister    Family history is notable that her mother died at age 14 with possible congestive heart failure.  Her father died at 42 with sudden death.  He had COPD.  A brother died at 59 with cancer and a sister died at 5  with cancer.   ROS General: Negative; No fevers, chills, or night sweats HEENT: Negative; No changes in vision or hearing, sinus congestion, difficulty  swallowing Pulmonary: Negative; No cough, wheezing, shortness of breath, hemoptysis Cardiovascular:  See HPI;  GI: Negative; No nausea, vomiting, diarrhea, or abdominal pain GU: Occasional urinary incontinence Musculoskeletal: Positive for arthritis in her right knee Hematologic/Oncologic: Negative; no easy bruising, bleeding Endocrine: History of partial thyroidectomy Neuro: Negative; no changes in balance, headaches Skin: Negative; No rashes or skin lesions Psychiatric: Prior history of depression, currently stable Sleep: Negative; No daytime sleepiness, hypersomnolence, bruxism, restless legs, hypnogagnic hallucinations Other comprehensive 14 point system review is negative   Physical Exam BP (!) 186/78    Pulse (!) 56    Temp (!) 97.2 F (36.2 C)    Ht '5\' 2"'$  (1.575 m)    Wt 146 lb 3.2 oz (66.3 kg)    LMP 07/27/1998 (LMP Unknown)    SpO2 99%    BMI 26.74 kg/m    Repeat blood pressure by me was 182/76  Wt Readings from Last 3 Encounters:  02/06/19 146 lb 3.2 oz (66.3 kg)  08/08/18 145 lb 9.6 oz (66 kg)  05/09/18 142 lb 3.2 oz (64.5 kg)   General: Alert, oriented, no distress.  Skin: normal turgor, no rashes, warm and dry HEENT: Normocephalic, atraumatic. Pupils equal round and reactive to light; sclera anicteric; extraocular muscles intact;  Nose without nasal septal hypertrophy Mouth/Parynx benign;  Neck: No JVD, no carotid bruits; normal carotid upstroke Lungs: clear to ausculatation and percussion; no wheezing or rales Chest wall: without tenderness to palpitation Heart: PMI not displaced, RRR, s1 s2 normal, 1/6 systolic murmur, no diastolic murmur, no rubs, gallops, thrills, or heaves Abdomen: soft, nontender; no hepatosplenomehaly, BS+; abdominal aorta nontender and not dilated by palpation. Back: no CVA  tenderness Pulses 2+ Musculoskeletal: full range of motion, normal strength, no joint deformities Extremities: no clubbing cyanosis or edema, Homan's sign negative  Neurologic: grossly nonfocal; Cranial nerves grossly wnl Psychologic: Normal mood and affect   ECG (independently read by me): Sinus Bradycardia at 56; NSSTT changes  January 2010 ECG (independently read by me): Sinus bradycardia 59 bpm.  Left bundle branch block with repolarization changes.  May 09, 2018 ECG (independently read by me): Sinus bradycardia 55 bpm.  Left bundle branch block with repolarization changes.  No ectopy.  November 08, 2017 ECG (independently read by me): Normal sinus rhythm at 66 bpm with occasional PACs.  Left bundle branch block with mild repolarization  October 2018 ECG (independently read by me): Sinus bradycardia 56 bpm.  Left axis deviation.  Left bundle branch block.  QTc interval 424 ms.  August 2018 ECG (independently read by me): Sinus rhythm at 90 bpm with frequent PVCs with transient bigeminy.  May 2018 ECG (independently read by me): Sinus rhythm at 97 bpm with frequent PVCs.  Left bundle branch block.  PR interval 142.  QTc interval 457 ms.  January 2018 ECG (independently read by me): Sinus rhythm at 64 bpm.  Occasional isolated PVC followed by compensatory pause.  QTc interval 44 ms.  PR interval 128 ms.  No ST segment changes.  July 2017 ECG (independently read by me): Sinus bradycardia 56 bpm.  Normal intervals.  Previously noted T-wave changes in leads 3 and aVF.  ECG (independently read by me): sinus bradycardia 57.  Normal intervals.  Nondiagnostic T changes in lead 27 Nov 2014 ECG (independently read by me): Sinus bradycardia 52 bpm.  ST changes improved.  QTc interval 381 ms.  07/26/2014 ECG (independently read by me): Normal sinus rhythm at 68 bpm.  There is mild  downsloping ST segment depression in leads II, III, and F V4 through V6, which is slightly more progressive since  the patient's prior ECG done in [place].  LABS: Laboratory from 11/11/2015 including a lipid panel and CBC were reviewed.  BMP Latest Ref Rng & Units 08/26/2018 09/29/2016 09/18/2016  Glucose 65 - 99 mg/dL 114(H) 131(H) 198(H)  BUN 8 - 27 mg/dL '15 13 17  '$ Creatinine 0.57 - 1.00 mg/dL 1.27(H) 1.18(H) 1.32(H)  BUN/Creat Ratio 12 - 28 12 - -  Sodium 134 - 144 mmol/L 139 139 139  Potassium 3.5 - 5.2 mmol/L 4.5 3.8 4.2  Chloride 96 - 106 mmol/L 101 106 104  CO2 20 - 29 mmol/L '21 24 23  '$ Calcium 8.7 - 10.3 mg/dL 9.4 9.1 9.6   Hepatic Function Latest Ref Rng & Units 09/18/2016 02/07/2016 05/30/2015  Total Protein 6.5 - 8.1 g/dL 6.5 6.8 6.7  Albumin 3.5 - 5.0 g/dL 3.9 4.1 4.3  AST 15 - 41 U/L '26 19 30  '$ ALT 14 - 54 U/L 20 15 32(H)  Alk Phosphatase 38 - 126 U/L 59 72 89  Total Bilirubin 0.3 - 1.2 mg/dL 0.6 0.5 0.5  Bilirubin, Direct 0.0 - 0.3 mg/dL - - -   CBC Latest Ref Rng & Units 09/30/2016 09/29/2016 09/18/2016  WBC 4.0 - 10.5 K/uL 14.1(H) 11.4(H) 8.2  Hemoglobin 12.0 - 15.0 g/dL 10.6(L) 11.0(L) 13.0  Hematocrit 36.0 - 46.0 % 32.4(L) 32.3(L) 38.0  Platelets 150 - 400 K/uL 170 168 239   Lab Results  Component Value Date   MCV 89.3 09/30/2016   MCV 88.0 09/29/2016   MCV 87.8 09/18/2016   Lab Results  Component Value Date   TSH 1.471 05/30/2015  No results found for: HGBA1C  Lipid Panel     Component Value Date/Time   CHOL 126 02/14/2015 0400   TRIG 108 02/14/2015 0400   HDL 32 (L) 02/14/2015 0400   CHOLHDL 3.9 02/14/2015 0400   VLDL 22 02/14/2015 0400   LDLCALC 72 02/14/2015 0400   I personally reviewed the blood work done at [facility] from 11/02/2017: Potassium 4.6.  Creatinine 1.27.  Normal LFTs.  Cholesterol 144, triglycerides 90, HDL 43, LDL 82.  TSH 0.96.   RADIOLOGY: No results found.  IMPRESSION:  1. Essential hypertension   2. Hyperlipidemia LDL goal <70   3. Medication management   4. CAD in native artery   5. H/O Paroxysmal SVT (supraventricular  tachycardia) (HCC)   6. History of Cerebrovascular accident (CVA), unspecified mechanism ([place])     ASSESSMENT AND PLAN:  Ms.Dutkiewicz is a 68 year-old female who has a long-standing history of hypertension, which initially improved with the addition of amlodipine 5 mg added to her Ziac and lisinopril therapy.  In 2015 she had experienced episodic chest tightness. Cardiac catheterization performed after a nuclear perfusion study raised the possibility of anteroseptal defect  revealed a smooth 40-50% LAD stenosis which did not completely normalize following intracoronary nitroglycerin, and otherwise normal coronary arteries.  She had remained stable without recurrent episodes of chest pain on her current medical therapy.  Lipid studies were still elevated in 2017 and her dose of atorvastatin to 40 mg with improved results. She was hospitalized in [place] and was told of having a stroke which was presumed to be of cerebellar origin based on her description of significant symptoms.  She had an MRI, CT, carotid Doppler studies, and echo Doppler study showed normal ejection fraction at 55-60%.  There was no obvious  evidence of a PFO on color Doppler or bubble study. A 2 week event monitor in June 2018 showed sinus rhythm with occasional PACs.  There were episodes of sinus tachycardia.  There was one episode of PSVT with heart rates of 144 bpm.  Of note, there was also an episode of sinus tachycardia at 5:30 AM while sleeping.  In the past due to fatigability on metoprolol she was switched to Bystolic.  When I saw her in January 2020, her blood pressure was elevated and lisinopril was further titrated to 30 mg.  She was bradycardic in the 50s.  She also had experienced intermittent leg swelling which subsequently resolved.  Today her blood pressure is significantly elevated.  This may be in some way contributed by her DayQuil which she had taken this morning.  She no longer can afford Bystolic which is been  costing her close to $400 per month.  At present, when she runs out of her Bystolic she will change to carvedilol 12.5 mg twice a day.  I am further titrating lisinopril to 40 mg daily.  She will monitor her blood pressure at home and she notes a systolic blood pressure above 140 then she should take HCTZ for additional blood pressure control even though she does not have edema.  I discussed with her stage I hypertension commences at 130/80 with stage II hypertension at 140/90.  She continues to be on atorvastatin 40 mg daily and fish oil for hyperlipidemia with target LDL less than 70.  She is not having any chest pain symptomatology.  Her most recent creatinine was 1.27 with an estimated GFR at 43 on August 26, 2018.  I will see her in follow-up in 3 months and prior to that office evaluation she will undergo a complete set of laboratory since none has been checked since January.   Time spent: 25 minutes Troy Sine, MD, [facility] 02/06/2019 12:07 PM

## 2019-04-27 LAB — COMPREHENSIVE METABOLIC PANEL
ALT: 13 IU/L (ref 0–32)
AST: 16 IU/L (ref 0–40)
Albumin/Globulin Ratio: 1.5 (ref 1.2–2.2)
Albumin: 4.1 g/dL (ref 3.8–4.8)
Alkaline Phosphatase: 97 IU/L (ref 39–117)
BUN/Creatinine Ratio: 13 (ref 12–28)
BUN: 17 mg/dL (ref 8–27)
Bilirubin Total: 0.2 mg/dL (ref 0.0–1.2)
CO2: 24 mmol/L (ref 20–29)
Calcium: 9.3 mg/dL (ref 8.7–10.3)
Chloride: 105 mmol/L (ref 96–106)
Creatinine, Ser: 1.35 mg/dL — ABNORMAL HIGH (ref 0.57–1.00)
GFR calc Af Amer: 46 mL/min/{1.73_m2} — ABNORMAL LOW (ref >59)
GFR calc non Af Amer: 40 mL/min/{1.73_m2} — ABNORMAL LOW (ref >59)
Globulin, Total: 2.7 g/dL (ref 1.5–4.5)
Glucose: 125 mg/dL — ABNORMAL HIGH (ref 65–99)
Potassium: 5.1 mmol/L (ref 3.5–5.2)
Sodium: 142 mmol/L (ref 134–144)
Total Protein: 6.8 g/dL (ref 6.0–8.5)

## 2019-04-27 LAB — TSH: TSH: 1.47 u[IU]/mL (ref 0.450–4.500)

## 2019-04-27 LAB — LIPID PANEL
Chol/HDL Ratio: 2.6 ratio (ref 0.0–4.4)
Cholesterol, Total: 124 mg/dL (ref 100–199)
HDL: 47 mg/dL (ref >39)
LDL Chol Calc (NIH): 61 mg/dL (ref 0–99)
Triglycerides: 84 mg/dL (ref 0–149)
VLDL Cholesterol Cal: 16 mg/dL (ref 5–40)

## 2019-04-27 LAB — CBC
Hematocrit: 39.6 % (ref 34.0–46.6)
Hemoglobin: 13 g/dL (ref 11.1–15.9)
MCH: 28.9 pg (ref 26.6–33.0)
MCHC: 32.8 g/dL (ref 31.5–35.7)
MCV: 88 fL (ref 79–97)
Platelets: 260 10*3/uL (ref 150–450)
RBC: 4.5 x10E6/uL (ref 3.77–5.28)
RDW: 12.6 % (ref 11.7–15.4)
WBC: 10.4 10*3/uL (ref 3.4–10.8)

## 2019-05-15 ENCOUNTER — Other Ambulatory Visit: Payer: Self-pay

## 2019-05-15 ENCOUNTER — Encounter: Payer: Self-pay | Admitting: Cardiovascular Disease

## 2019-05-15 ENCOUNTER — Ambulatory Visit (INDEPENDENT_AMBULATORY_CARE_PROVIDER_SITE_OTHER): Payer: Medicare Other | Admitting: Cardiovascular Disease

## 2019-05-15 VITALS — BP 170/82 | HR 46 | Temp 97.0°F | Ht 62.0 in | Wt 143.2 lb

## 2019-05-15 DIAGNOSIS — I2510 Atherosclerotic heart disease of native coronary artery without angina pectoris: Secondary | ICD-10-CM

## 2019-05-15 DIAGNOSIS — I471 Supraventricular tachycardia, unspecified: Secondary | ICD-10-CM

## 2019-05-15 DIAGNOSIS — E785 Hyperlipidemia, unspecified: Secondary | ICD-10-CM

## 2019-05-15 DIAGNOSIS — I10 Essential (primary) hypertension: Secondary | ICD-10-CM | POA: Diagnosis not present

## 2019-05-15 MED ORDER — ASPIRIN EC 81 MG PO TBEC: 81.0000 mg | DELAYED_RELEASE_TABLET | Freq: Every day | ORAL | 3 refills | Status: AC

## 2019-05-15 MED ORDER — CARVEDILOL 12.5 MG PO TABS: ORAL_TABLET | ORAL | 4 refills | Status: DC

## 2019-05-15 MED ORDER — DILTIAZEM HCL ER COATED BEADS 240 MG PO CP24: 240.0000 mg | ORAL_CAPSULE | Freq: Every day | ORAL | 4 refills | Status: DC

## 2019-05-15 NOTE — Progress Notes (Signed)
Patient ID: [name], female   DOB: 28-Aug-1950, 68 y.o.   MRN: [id number]    HPI:  [name] is a 68 y.o. female who is followed by Dr. Ginette Otto in [place], [place].  Her uncle, [name], is one of my patients.  She presents to the office today for a 3 month follow-up cardiology evaluation.   [name] has a history of hypertension dating back for at least 2002, and  recently  has been maintained on lisinopril 10 mg and Ziac 5/6.25 mg daily.  She has experienced episodic chest pressure intermittently and had initially undergone a stress test a  7 years ago.  She also has a history of depression. 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 She had noticed increasing palpitations and also  developed episodes of chest tightness last year. She underwent a nuclear perfusion study in July 2015 She was felt to have a small fixed anteroseptal defect without ischemia.  Ejection fraction was 39%, but was felt that this may not be accurate due to many PVCs during the study.  She subsequently underwent a 2-D echo Doppler study in October 2015 in [place] , which demonstrated an ejection fraction at 55-60%.  There was mild left ventricular posterior wall thickness.  Check equivocal tissue Doppler assessment.  The peak pressure gradient across her aortic valve was 7.6 mm with a mean pressure gradient of 4.3 mm without evidence for significant aortic stenosis.  The patient states that recently she has noticed more chest tightness and this seems to occur with only walking halfway down her driveway.    When I initially saw her in December 2015,  I added amlodipine 5 mg to help both with blood pressure control as well as potential anti-ischemic benefit.   Due to recurrent symptoms, she underwent a catheterization on 08/03/2014 which revealed smooth 40-50% proximal LAD stenosis after the takeoff of the first diagonal vessel which did not significant normalize following intracoronary  nitroglycerin administration.  Otherwise her coronary anatomy was normal.  She had normal LV function without wall motion abnormalities and evidence for very mild  mitral valve prolapse without significant mitral regurgitation.  She was started on statin therapy with her LDL of 113 attempt to induce plaque stability and regression.  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  She denied any recurrent episodes of chest pain or tightness.  She denies presyncope or syncope.  She was told of having mild osteopenia on bone density imaging.  She underwent a CBC, and lipid study by her primary physician.  Her total cholesterol was 142, HDL 43, LDL 83, and triglycerides 76.  Hemoglobin and hematocrit were 13.2 and 39.1.  She denies any palpitations.She denies any recurrent chest pain.  She does note less shortness of breath.  She is on amlodipine 5 mg Ziac 5/6.25 mg, lisinopril 10 mg daily.  She is now on an increased atorvastatin dose at 40 mg daily.  Lab work from one year ago on 11/08/2014 showed a total cholesterol 236, LDL 163, triglycerides 124.  Her lipid studies have improved under increased atorvastatin dose.    When I saw her in January 2018 she had noticed an occasional pause when she takes her pulse.  She denies any sensation of tachycardia.  She denies presyncope.  She denies syncope.  There are no episodes of chest pain.  She was  need for right knee surgery to be done by Dr. Delfino Lovett.  She underwent her knee surgery vessel by Dr. Delfino Lovett on 09/28/2016.  She had developed some swelling initially post procedure.  She was doing well but on 12/11/2016, she developed right arm, hand weakness, nausea and vomiting.  She was hospitalized in [place] and was diagnosed as having a stroke.  Apparently, during that evaluation, she underwent CT imaging, MRI, carotid duplex imaging and appears that she may have had an echo bubble study.Since her discharge, she has noticed  significant improvement in her neurologic capacity.  According to her description, it sounds as though she was significant dysarthric and may have had a cerebellar infarct in the etiology of her presentation.  She was discharged on baby aspirin 81 mg in addition to her lisinopril 5 mg, Toprol XL 25 mg, atorvastatin 40 mg, amlodipine 5 mg.  when I saw her in follow-up at her last office visit, she denied any episodes of chest pain, was unaware of any atrial fibrillation and had noticed episodes of heart rates in the 90s to 100s.  I was able to obtain records from the patient's hospitalization in [place].  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  On 12/13/2016 an echo Doppler study showed an EF of 55-60% with normal right ventricular size with normal function.  She had mild left atrial dilation, mild MR, aortic sclerosis without stenosis, and mild TR.  She wore an event monitor in June 2018.   This showed sinus rhythm.  She had episodes of sinus tachycardia with PACs, sinus rhythm with PACs, and was an episode of sinus rhythm with PSVT and bigeminal PACs.  Her heart rate had increased to 144 bpm.  During SVT episode.  She also was noted to have sinus tachycardia while sleeping at 5:30 AM for which she was unaware.  When I saw her in follow-up, she had not felt well on the metoprolol and I weaned and ultimately discontinued this and in its place started Bystolic at 5 mg with potential titration to 10 mg as tolerated.  I also discontinued her amlodipine and switch to Cardizem CD 180 mg, both for blood pressure control as well as in attempt to improve rate control.  With this medication adjustment, she has felt significantly better.    When I saw her in October 2018, her ECG was stable with a ventricular rate at 56 bpm.  Her blood pressure was elevated.  She had felt improved on Bystolic 5 mg in addition to Cardizem CD 180 mg.  I  recommended further titration of lisinopril.    I saw her in April 2019 and at that time she felt well with reference to palpitations.  Her ECG showed occasional PACs.    I last saw her in October 2019 and had noted the development of  right lower extremity edema which ultimately resolved when she wore compressions stockings.  She been her primary doctor who is scheduled her for Doppler studies but apparently this had never been done and her edema resolved.  She denies any chest pain or episodes of presyncope or syncope.   When I last saw her in January 2020 she had some issues with urinary incontinence.  She had been given a prescription for Myrbetriq by he physician but she never filled this due to conmcerns about side effects.  She continued to experience right lower extremity swelling occasionally.  Her blood pressure was elevated and I recommended further titration of lisinopril to  30 mg.  I also recommended HCTZ to take on an as-needed basis in light of her leg swelling.  Subsequent laboratory showed a BUN of 15 creatinine 1.27 on August 26, 2018.  I last saw her in July 2020 at which time she was feeling well and denied chest pain, PND or orthopnea.   She brought with her her blood pressure log.  Her blood pressure has been labile and at times has been in the 160s and other times in the 110s to 120s.  She denies any significant leg swelling.  She has not taken the HCTZ.  She can no longer afford Bystolic and wanted to switch and according to her insurance her options are metoprolol tartrate or carvedilol.  In the past she did not tolerate metoprolol due to fatigability.  During that evaluation, I recommended changing Bystolic to carvedilol 27.7 mg twice a day and further titrated lisinopril to 40 mg daily due to her hypertension.  She states at home typically her blood pressure is running in the mid 824M systolically.   Past medical history is notable for hypertension, palpitations, depressive  disorder, osteoarthritis.  Past surgical history is notable for partial thyroidectomy while she was in eighth grade, and recent right knee surgery  Current Outpatient Medications  Medication Sig Dispense Refill   ARTIFICIAL TEAR OP Apply 1 drop to eye daily as needed (dry eyes).     atorvastatin (LIPITOR) 40 MG tablet Take 1 tablet (40 mg total) by mouth daily. 90 tablet 3   CALCIUM PO Take 1,200 mg by mouth 2 (two) times daily.     carvedilol (COREG) 12.5 MG tablet Take 1.5 tablets (18.75 mg total) by mouth every morning AND 1 tablet (12.5 mg total) every evening. 75 tablet 4   Coenzyme Q10 (COQ-10) 200 MG CAPS Take 200 mg by mouth daily.     diltiazem (CARDIZEM CD) 240 MG 24 hr capsule Take 1 capsule (240 mg total) by mouth daily. 30 capsule 4   hydrochlorothiazide (MICROZIDE) 12.5 MG capsule Take 1 capsule (12.5 mg total) by mouth as needed (for swelling). 90 capsule 3   lisinopril (ZESTRIL) 40 MG tablet Take 1 tablet (40 mg total) by mouth daily. 90 tablet 1   nitroGLYCERIN (NITROSTAT) 0.4 MG SL tablet Place 1 tablet (0.4 mg total) under the tongue every 5 (five) minutes as needed for chest pain. 25 tablet 3   Omega-3 Fatty Acids (FISH OIL ULTRA) 1400 MG CAPS Take 1,400 mg by mouth 2 (two) times daily.     aspirin EC 81 MG tablet Take 1 tablet (81 mg total) by mouth daily. 90 tablet 3   No current facility-administered medications for this visit.    Social history is notable in that she is married for >40 years.  She does not have any children.  She lives with her husband.  She completed 12th grade of education.  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  There is no tobacco history or alcohol use.  She does care for many dogs.  Family History  Problem Relation Age of Onset   Heart failure Mother    Sudden death Father    COPD Father    Cancer Brother    Cancer Sister    Family history is notable that her mother died at age 8  with possible congestive heart failure.  Her father died at 56 with sudden death.  He had COPD.  A brother died at 24  with cancer and a sister died at 65 with cancer.   ROS General: Negative; No fevers, chills, or night sweats HEENT: Negative; No changes in vision or hearing, sinus congestion, difficulty swallowing Pulmonary: Negative; No cough, wheezing, shortness of breath, hemoptysis Cardiovascular:  See HPI;  GI: Negative; No nausea, vomiting, diarrhea, or abdominal pain GU: Occasional urinary incontinence Musculoskeletal: Positive for arthritis in her right knee Hematologic/Oncologic: Negative; no easy bruising, bleeding Endocrine: History of partial thyroidectomy Neuro: Negative; no changes in balance, headaches Skin: Negative; No rashes or skin lesions Psychiatric: Prior history of depression, currently stable Sleep: Negative; No daytime sleepiness, hypersomnolence, bruxism, restless legs, hypnogagnic hallucinations Other comprehensive 14 point system review is negative   Physical Exam Pulse (!) 46    Temp (!) 97 F (36.1 C)    Ht _0  (1.575 m)    Wt 143 lb 3.2 oz (65 kg)    LMP 07/27/1998 (LMP Unknown)    SpO2 97%    BMI 26.19 kg/m    Repeat blood pressure by me was 182/76; pulse in the 70s  Wt Readings from Last 3 Encounters:  05/15/19 143 lb 3.2 oz (65 kg)  02/06/19 146 lb 3.2 oz (66.3 kg)  08/08/18 145 lb 9.6 oz (66 kg)   General: Alert, oriented, no distress.  Skin: normal turgor, no rashes, warm and dry HEENT: Normocephalic, atraumatic. Pupils equal round and reactive to light; sclera anicteric; extraocular muscles intact;  Nose without nasal septal hypertrophy Mouth/Parynx benign; Mallinpatti scale 3 Neck: No JVD, no carotid bruits; normal carotid upstroke Lungs: clear to ausculatation and percussion; no wheezing or rales Chest wall: without tenderness to palpitation Heart: PMI not displaced, RRR, s1 s2 normal, 1/6 systolic murmur, no diastolic murmur, no rubs,  gallops, thrills, or heaves Abdomen: soft, nontender; no hepatosplenomehaly, BS+; abdominal aorta nontender and not dilated by palpation. Back: no CVA tenderness Pulses 2+ Musculoskeletal: full range of motion, normal strength, no joint deformities Extremities: no clubbing cyanosis or edema, Homan's sign negative  Neurologic: grossly nonfocal; Cranial nerves grossly wnl Psychologic: Normal mood and affect   ECG (independently read by me): Sinus ryhthm at 70 with PVCs; nonspecific ST changes; QTc 124 msec  July 2020 ECG (independently read by me): Sinus Bradycardia at 56; NSSTT changes  January 2010 ECG (independently read by me): Sinus bradycardia 59 bpm.  Left bundle branch block with repolarization changes.  May 09, 2018 ECG (independently read by me): Sinus bradycardia 55 bpm.  Left bundle branch block with repolarization changes.  No ectopy.  November 08, 2017 ECG (independently read by me): Normal sinus rhythm at 66 bpm with occasional PACs.  Left bundle branch block with mild repolarization  October 2018 ECG (independently read by me): Sinus bradycardia 56 bpm.  Left axis deviation.  Left bundle branch block.  QTc interval 424 ms.  August 2018 ECG (independently read by me): Sinus rhythm at 90 bpm with frequent PVCs with transient bigeminy.  May 2018 ECG (independently read by me): Sinus rhythm at 97 bpm with frequent PVCs.  Left bundle branch block.  PR interval 142.  QTc interval 457 ms.  January 2018 ECG (independently read by me): Sinus rhythm at 64 bpm.  Occasional isolated PVC followed by compensatory pause.  QTc interval 44 ms.  PR interval 128 ms.  No ST segment changes.  July 2017 ECG (independently read by me): Sinus bradycardia 56 bpm.  Normal intervals.  Previously noted T-wave changes in leads 3 and aVF.  ECG (independently read by  me): sinus bradycardia 57.  Normal intervals.  Nondiagnostic T changes in lead 27 Nov 2014 ECG (independently read by me): Sinus  bradycardia 52 bpm.  ST changes improved.  QTc interval 381 ms.  07/26/2014 ECG (independently read by me): Normal sinus rhythm at 68 bpm.  There is mild downsloping ST segment depression in leads II, III, and F V4 through V6, which is slightly more progressive since the patient's prior ECG done in [place].  LABS: Laboratory from 11/11/2015 including a lipid panel and CBC were reviewed.  BMP Latest Ref Rng & Units 04/27/2019 08/26/2018 09/29/2016  Glucose 65 - 99 mg/dL 125(H) 114(H) 131(H)  BUN 8 - 27 mg/dL _0 Creatinine 0.57 - 1.00 mg/dL 1.35(H) 1.27(H) 1.18(H)  BUN/Creat Ratio 12 - _1 -  Sodium 134 - 144 mmol/L 142 139 139  Potassium 3.5 - 5.2 mmol/L 5.1 4.5 3.8  Chloride 96 - 106 mmol/L 105 101 106  CO2 20 - 29 mmol/L _2 Calcium 8.7 - 10.3 mg/dL 9.3 9.4 9.1   Hepatic Function Latest Ref Rng & Units 04/27/2019 09/18/2016 02/07/2016  Total Protein 6.0 - 8.5 g/dL 6.8 6.5 6.8  Albumin 3.8 - 4.8 g/dL 4.1 3.9 4.1  AST 0 - 40 IU/L _3 ALT 0 - 32 IU/L _4 Alk Phosphatase 39 - 117 IU/L 97 59 72  Total Bilirubin 0.0 - 1.2 mg/dL 0.2 0.6 0.5  Bilirubin, Direct 0.0 - 0.3 mg/dL - - -   CBC Latest Ref Rng & Units 04/27/2019 09/30/2016 09/29/2016  WBC 3.4 - 10.8 x10E3/uL 10.4 14.1(H) 11.4(H)  Hemoglobin 11.1 - 15.9 g/dL 13.0 10.6(L) 11.0(L)  Hematocrit 34.0 - 46.6 % 39.6 32.4(L) 32.3(L)  Platelets 150 - 450 x10E3/uL 260 170 168   Lab Results  Component Value Date   MCV 88 04/27/2019   MCV 89.3 09/30/2016   MCV 88.0 09/29/2016   Lab Results  Component Value Date   TSH 1.470 04/27/2019  No results found for: HGBA1C  Lipid Panel     Component Value Date/Time   CHOL 124 04/27/2019 0814   TRIG 84 04/27/2019 0814   HDL 47 04/27/2019 0814   CHOLHDL 2.6 04/27/2019 0814   CHOLHDL 3.9 02/14/2015 0400   VLDL 22 02/14/2015 0400   LDLCALC 61 04/27/2019 0814   I personally reviewed the blood work done at [facility] in clinic from 11/02/2017: Potassium 4.6.   Creatinine 1.27.  Normal LFTs.  Cholesterol 144, triglycerides 90, HDL 43, LDL 82.  TSH 0.96.   RADIOLOGY: No results found.  IMPRESSION:  1. Essential hypertension   2. Hyperlipidemia LDL goal <70   3. CAD in native artery   4. H/O Paroxysmal SVT (supraventricular tachycardia) (HCC)     ASSESSMENT AND PLAN:  Ms.Duplessis is a 68 year-old female who has a long-standing history of hypertension, which remotely improved with the addition of amlodipine 5 mg added to her Ziac and lisinopril therapy.  In 2015 she had experienced episodic chest tightness. Cardiac catheterization performed after a nuclear perfusion study raised the possibility of anteroseptal defect demonstrated smooth 40-50% LAD stenosis which did not completely normalize following intracoronary nitroglycerin, and otherwise normal coronary arteries.  She had remained stable without recurrent episodes of chest pain on her current medical therapy.  Lipid studies were still elevated in 2017 and her dose of atorvastatin to 40 mg with improved results. She was hospitalized in [place] and was told of having a stroke  which was presumed to be of cerebellar origin based on her description of significant symptoms.  She had an MRI, CT, carotid Doppler studies, and echo Doppler study showed normal ejection fraction at 55-60%.  There was no obvious evidence of a PFO on color Doppler or bubble study. A 2 week event monitor in June 2018 showed sinus rhythm with occasional PACs.  There were episodes of sinus tachycardia.  There was one episode of PSVT with heart rates of 144 bpm.  Of note, there was also an episode of sinus tachycardia at 5:30 AM while sleeping.  In the past due to fatigability on metoprolol she was switched to Bystolic.  When I saw her in January 2020, her blood pressure was elevated and lisinopril was further titrated to 30 mg.  She was bradycardic in the 50s.  She also had experienced intermittent leg swelling which subsequently  resolved.  Previously she had tolerated Bystolic well but due to the excessive cost of $400 a month when last seen this was switched to carvedilol 12.5 mg twice a day.  Blood pressure evaluation by me today blood pressure remains elevated at 170/82.  I suspect she continues to have been labile component and as result I am recommending slight titration of carvedilol to 18.75 mg in the morning and 12.5 mg at night.  Her resting pulse was in the 70s and her ECG today confirms sinus rhythm at 70.  With her prior history of SVT and her ongoing hypertension I have recommended she not take her Cardizem in the morning and will change her to Cardizem CD 240 mg at bedtime.  She continues to be on lisinopril 40 mg.  She will monitor her heart rate.  If her heart rate is consistently bradycardic with rates in the low 50s dose reduction of carvedilol will be necessary.  She continues to be on atorvastatin for hyperlipidemia.  Recent cholesterol was 124, triglycerides 84 HDL 47 and LDL 61 on April 27, 2019.  I will see her in 4 months for reevaluation or sooner if problems arise.  Time spent: 25 minutes Troy Sine, MD, [facility] 05/16/2019 4:53 PM

## 2019-05-15 NOTE — Patient Instructions (Addendum)
Medication Instructions:  INCREASE CARVEDILOL 240MG  DAILY INCREASE DILTIAZEM 18.75MG  IN THE AM AND 12.5MG  IN THE PM DAILY  If you need a refill on your cardiac medications before your next appointment, please call your pharmacy.  Follow-Up: IN 4 months In Person You may see Shelva Majestic, MD or one of the following Advanced Practice Providers on your designated Care Team:  Rosaria Ferries, PA-C Jory Sims, DNP, ANP Cadence Kathlen Mody, NP.    Please call our office in advance, [place] to schedule this FEB 2021 appointment.   At [facility], you and your health needs are our priority.  As part of our continuing mission to provide you with exceptional heart care, we have created designated Provider Care Teams.  These Care Teams include your primary Cardiologist (physician) and Advanced Practice Providers (APPs -  Physician Assistants and Nurse Practitioners) who all work together to provide you with the care you need, when you need it.  Thank you for choosing [facility] at [facility]!!

## 2019-05-16 ENCOUNTER — Encounter: Payer: Self-pay | Admitting: Cardiovascular Disease

## 2019-06-28 ENCOUNTER — Other Ambulatory Visit: Payer: Self-pay | Admitting: Cardiovascular Disease

## 2019-07-05 ENCOUNTER — Other Ambulatory Visit: Payer: Self-pay | Admitting: Cardiovascular Disease

## 2019-07-26 ENCOUNTER — Other Ambulatory Visit: Payer: Self-pay

## 2019-07-26 MED ORDER — ATORVASTATIN CALCIUM 40 MG PO TABS: 40.0000 mg | ORAL_TABLET | Freq: Every day | ORAL | 3 refills | Status: DC

## 2019-07-26 MED ORDER — LISINOPRIL 40 MG PO TABS: 40.0000 mg | ORAL_TABLET | Freq: Every day | ORAL | 3 refills | Status: DC

## 2019-07-26 NOTE — Telephone Encounter (Signed)
Rx(s) sent to pharmacy electronically.  

## 2019-09-19 ENCOUNTER — Encounter: Payer: Self-pay | Admitting: Cardiovascular Disease

## 2019-09-19 ENCOUNTER — Other Ambulatory Visit: Payer: Self-pay

## 2019-09-19 ENCOUNTER — Ambulatory Visit (INDEPENDENT_AMBULATORY_CARE_PROVIDER_SITE_OTHER): Payer: Medicare Other | Admitting: Cardiovascular Disease

## 2019-09-19 DIAGNOSIS — E785 Hyperlipidemia, unspecified: Secondary | ICD-10-CM

## 2019-09-19 DIAGNOSIS — I10 Essential (primary) hypertension: Secondary | ICD-10-CM | POA: Diagnosis not present

## 2019-09-19 DIAGNOSIS — I471 Supraventricular tachycardia: Secondary | ICD-10-CM | POA: Diagnosis not present

## 2019-09-19 DIAGNOSIS — I2510 Atherosclerotic heart disease of native coronary artery without angina pectoris: Secondary | ICD-10-CM

## 2019-09-19 MED ORDER — DILTIAZEM HCL ER COATED BEADS 240 MG PO CP24: 240.0000 mg | ORAL_CAPSULE | Freq: Every day | ORAL | 3 refills | Status: DC

## 2019-09-19 NOTE — Patient Instructions (Signed)
Medication Instructions:  CONTINUE WITH CURRENT MEDICATIONS. NO CHANGES.  *If you need a refill on your cardiac medications before your next appointment, please call your pharmacy*  Follow-Up: At [facility], you and your health needs are our priority.  As part of our continuing mission to provide you with exceptional heart care, we have created designated Provider Care Teams.  These Care Teams include your primary Cardiologist (physician) and Advanced Practice Providers (APPs -  Physician Assistants and Nurse Practitioners) who all work together to provide you with the care you need, when you need it.  Your next appointment:   6 month(s)  The format for your next appointment:   In Person  Provider:   Thomas Kelly, MD    

## 2019-09-19 NOTE — Progress Notes (Signed)
Patient ID: [name], female   DOB: 1951/07/07, 69 y.o.   MRN: [id number]    HPI:  [name] is a 69 y.o. female who is followed by Dr. Ginette Otto in [place], [place].  Her uncle, [name], is one of my patients.  She presents to the office today for a 3 month follow-up cardiology evaluation.   [name] has a history of hypertension dating back for at least 2002, and  recently  has been maintained on lisinopril 10 mg and Ziac 5/6.25 mg daily.  She has experienced episodic chest pressure intermittently and had initially undergone a stress test a  7 years ago.  She also has a history of depression. 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 She had noticed increasing palpitations and also  developed episodes of chest tightness last year. She underwent a nuclear perfusion study in July 2015 She was felt to have a small fixed anteroseptal defect without ischemia.  Ejection fraction was 39%, but was felt that this may not be accurate due to many PVCs during the study.  She subsequently underwent a 2-D echo Doppler study in October 2015 in [place] , which demonstrated an ejection fraction at 55-60%.  There was mild left ventricular posterior wall thickness.  Check equivocal tissue Doppler assessment.  The peak pressure gradient across her aortic valve was 7.6 mm with a mean pressure gradient of 4.3 mm without evidence for significant aortic stenosis.  The patient states that recently she has noticed more chest tightness and this seems to occur with only walking halfway down her driveway.    When I initially saw her in December 2015,  I added amlodipine 5 mg to help both with blood pressure control as well as potential anti-ischemic benefit.   Due to recurrent symptoms, she underwent a catheterization on 08/03/2014 which revealed smooth 40-50% proximal LAD stenosis after the takeoff of the first diagonal vessel which did not significant normalize following intracoronary  nitroglycerin administration.  Otherwise her coronary anatomy was normal.  She had normal LV function without wall motion abnormalities and evidence for very mild  mitral valve prolapse without significant mitral regurgitation.  She was started on statin therapy with her LDL of 113 attempt to induce plaque stability and regression.  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  She denied any recurrent episodes of chest pain or tightness.  She denies presyncope or syncope.  She was told of having mild osteopenia on bone density imaging.  She underwent a CBC, and lipid study by her primary physician.  Her total cholesterol was 142, HDL 43, LDL 83, and triglycerides 76.  Hemoglobin and hematocrit were 13.2 and 39.1.  She denies any palpitations.She denies any recurrent chest pain.  She does note less shortness of breath.  She is on amlodipine 5 mg Ziac 5/6.25 mg, lisinopril 10 mg daily.  She is now on an increased atorvastatin dose at 40 mg daily.  Lab work from one year ago on 11/08/2014 showed a total cholesterol 236, LDL 163, triglycerides 124.  Her lipid studies have improved under increased atorvastatin dose.    When I saw her in January 2018 she had noticed an occasional pause when she takes her pulse.  She denies any sensation of tachycardia.  She denies presyncope.  She denies syncope.  There are no episodes of chest pain.  She was  need for right knee surgery to be done by Dr. Delfino Lovett.  She underwent her knee surgery vessel by Dr. Delfino Lovett on 09/28/2016.  She had developed some swelling initially post procedure.  She was doing well but on 12/11/2016, she developed right arm, hand weakness, nausea and vomiting.  She was hospitalized in [place] and was diagnosed as having a stroke.  Apparently, during that evaluation, she underwent CT imaging, MRI, carotid duplex imaging and appears that she may have had an echo bubble study.Since her discharge, she has noticed  significant improvement in her neurologic capacity.  According to her description, it sounds as though she was significant dysarthric and may have had a cerebellar infarct in the etiology of her presentation.  She was discharged on baby aspirin 81 mg in addition to her lisinopril 5 mg, Toprol XL 25 mg, atorvastatin 40 mg, amlodipine 5 mg.  when I saw her in follow-up at her last office visit, she denied any episodes of chest pain, was unaware of any atrial fibrillation and had noticed episodes of heart rates in the 90s to 100s.  I was able to obtain records from the patient's hospitalization in [place].  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  On 12/13/2016 an echo Doppler study showed an EF of 55-60% with normal right ventricular size with normal function.  She had mild left atrial dilation, mild MR, aortic sclerosis without stenosis, and mild TR.  She wore an event monitor in June 2018.   This showed sinus rhythm.  She had episodes of sinus tachycardia with PACs, sinus rhythm with PACs, and was an episode of sinus rhythm with PSVT and bigeminal PACs.  Her heart rate had increased to 144 bpm.  During SVT episode.  She also was noted to have sinus tachycardia while sleeping at 5:30 AM for which she was unaware.  When I saw her in follow-up, she had not felt well on the metoprolol and I weaned and ultimately discontinued this and in its place started Bystolic at 5 mg with potential titration to 10 mg as tolerated.  I also discontinued her amlodipine and switch to Cardizem CD 180 mg, both for blood pressure control as well as in attempt to improve rate control.  With this medication adjustment, she has felt significantly better.    When I saw her in October 2018, her ECG was stable with a ventricular rate at 56 bpm.  Her blood pressure was elevated.  She had felt improved on Bystolic 5 mg in addition to Cardizem CD 180 mg.  I  recommended further titration of lisinopril.    I saw her in April 2019 and at that time she felt well with reference to palpitations.  Her ECG showed occasional PACs.    I last saw her in October 2019 and had noted the development of  right lower extremity edema which ultimately resolved when she wore compressions stockings.  She been her primary doctor who is scheduled her for Doppler studies but apparently this had never been done and her edema resolved.  She denies any chest pain or episodes of presyncope or syncope.   When I last saw her in January 2020 she had some issues with urinary incontinence.  She had been given a prescription for Myrbetriq by he physician but she never filled this due to conmcerns about side effects.  She continued to experience right lower extremity swelling occasionally.  Her blood pressure was elevated and I recommended further titration of lisinopril to  30 mg.  I also recommended HCTZ to take on an as-needed basis in light of her leg swelling.  Subsequent laboratory showed a BUN of 15 creatinine 1.27 on August 26, 2018.  I  saw her in July 2020 at which time she was feeling well and denied chest pain, PND or orthopnea.   She brought with her her blood pressure log.  Her blood pressure has been labile and at times has been in the 160s and other times in the 110s to 120s.  She denies any significant leg swelling.  She has not taken the HCTZ.  She can no longer afford Bystolic and wanted to switch and according to her insurance her options are metoprolol tartrate or carvedilol.  In the past she did not tolerate metoprolol due to fatigability.  During that evaluation, I recommended changing Bystolic to carvedilol 16.1 mg twice a day and further titrated lisinopril to 40 mg daily due to her hypertension.  She states at home typically her blood pressure is running in the mid 096E systolically.  She was last evaluated by me in May 15, 2019 at which time she continued to be  hypertensive.  Resting pulse was in the 70s and her ECG confirmed sinus rhythm.  I recommended slight titration of carvedilol to 18.75 mg in the morning and 12.5 mg at night.  With her history of prior SVT and ongoing hypertension I recommended she change her Cardizem CD to 240 mg at bedtime and continue the lisinopril 40 mg.  She developed bradycardia dose reduction of carvedilol will be necessary.  Over the past 4 months, her blood pressure at home has been stable typically running in the 454U to 981 systolically.  She is unaware of any burst of SVT or palpitations.  She has had issues with arthritis of her low back and recently received Depo-Medrol injection by Dr. Ginette Otto.  She denies any anginal symptoms.  She denies PND orthopnea.  There is no exertional dyspnea.  She presents for follow-up evaluation.   Past medical history is notable for hypertension, palpitations, depressive disorder, osteoarthritis.  Past surgical history is notable for partial thyroidectomy while she was in eighth grade, and recent right knee surgery  Current Outpatient Medications  Medication Sig Dispense Refill  . ARTIFICIAL TEAR OP Apply 1 drop to eye daily as needed (dry eyes).    [name] aspirin EC 81 MG tablet Take 1 tablet (81 mg total) by mouth daily. 90 tablet 3  . atorvastatin (LIPITOR) 40 MG tablet Take 1 tablet (40 mg total) by mouth daily. 90 tablet 3  . calcium carbonate (OS-CAL) 600 MG TABS tablet Take by mouth.    [name] CALCIUM PO Take 1,200 mg by mouth 2 (two) times daily.    . Cholecalciferol 125 MCG (5000 UT) capsule Take by mouth.    . Coenzyme Q10 (COQ-10) 200 MG CAPS Take 200 mg by mouth daily.    [name] diltiazem (CARDIZEM CD) 240 MG 24 hr capsule Take 1 capsule (240 mg total) by mouth daily. 90 capsule 3  . lisinopril (ZESTRIL) 40 MG tablet Take 1 tablet (40 mg total) by mouth daily. 90 tablet 3  . nitroGLYCERIN (NITROSTAT) 0.4 MG SL tablet Place 1 tablet (0.4 mg total) under the tongue every 5 (five) minutes  as needed for chest pain. 25 tablet 3  . NON FORMULARY Take by mouth.    . Omega-3 Fatty Acids (FISH OIL ULTRA) 1400 MG CAPS Take 1,400 mg by mouth 2 (two) times daily.    [name]  carvedilol (COREG) 12.5 MG tablet Take 1.5 tablets (18.75 mg total) by mouth every morning AND 1 tablet (12.5 mg total) every evening. 75 tablet 4  . hydrochlorothiazide (MICROZIDE) 12.5 MG capsule Take 1 capsule (12.5 mg total) by mouth as needed (for swelling). 90 capsule 3   No current facility-administered medications for this visit.   Social history is notable in that she is married for >40 years.  She does not have any children.  She lives with her husband.  She completed 12th grade of education.  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  There is no tobacco history or alcohol use.  She does care for many dogs.  Family History  Problem Relation Age of Onset  . Heart failure Mother   . Sudden death Father   . COPD Father   . Cancer Brother   . Cancer Sister    Family history is notable that her mother died at age 49 with possible congestive heart failure.  Her father died at 86 with sudden death.  He had COPD.  A brother died at 46 with cancer and a sister died at 48 with cancer.   ROS General: Negative; No fevers, chills, or night sweats HEENT: Negative; No changes in vision or hearing, sinus congestion, difficulty swallowing Pulmonary: Negative; No cough, wheezing, shortness of breath, hemoptysis Cardiovascular:  See HPI;  GI: Negative; No nausea, vomiting, diarrhea, or abdominal pain GU: Occasional urinary incontinence Musculoskeletal: Positive for arthritis in her right knee Hematologic/Oncologic: Negative; no easy bruising, bleeding Endocrine: History of partial thyroidectomy Neuro: Negative; no changes in balance, headaches Skin: Negative; No rashes or skin lesions Psychiatric: Prior history of depression, currently stable Sleep: Negative; No daytime  sleepiness, hypersomnolence, bruxism, restless legs, hypnogagnic hallucinations Other comprehensive 14 point system review is negative   Physical Exam BP (!) 160/62   Pulse (!) 56   Temp (!) 97.1 F (36.2 C)   Ht _0  (1.575 m)   Wt 148 lb 3.2 oz (67.2 kg)   LMP 07/27/1998 (LMP Unknown)   SpO2 99%   BMI 27.11 kg/m    Repeat blood pressure by me was 132/68  Wt Readings from Last 3 Encounters:  09/19/19 148 lb 3.2 oz (67.2 kg)  05/15/19 143 lb 3.2 oz (65 kg)  02/06/19 146 lb 3.2 oz (66.3 kg)   General: Alert, oriented, no distress.  Skin: normal turgor, no rashes, warm and dry HEENT: Normocephalic, atraumatic. Pupils equal round and reactive to light; sclera anicteric; extraocular muscles intact;  Nose without nasal septal hypertrophy Mouth/Parynx benign; Mallinpatti scale 3 Neck: No JVD, no carotid bruits; normal carotid upstroke Lungs: clear to ausculatation and percussion; no wheezing or rales Chest wall: without tenderness to palpitation Heart: PMI not displaced, RRR, s1 s2 normal, 1/6 systolic murmur, no diastolic murmur, no rubs, gallops, thrills, or heaves Abdomen: soft, nontender; no hepatosplenomehaly, BS+; abdominal aorta nontender and not dilated by palpation. Back: no CVA tenderness Pulses 2+ Musculoskeletal: full range of motion, normal strength, no joint deformities Extremities: no clubbing cyanosis or edema, Homan's sign negative  Neurologic: grossly nonfocal; Cranial nerves grossly wnl Psychologic: Normal mood and affect   ECG (independently read by me): Sinus bradycardia 55 bpm, left axis deviation, left bundle branch block.   October 2020 ECG (independently read by me): Sinus ryhthm at 70 with PVCs; nonspecific ST changes; QTc 124 msec  July 2020 ECG (independently read by me): Sinus Bradycardia at 56; NSSTT changes  January 2010 ECG (independently read by me): Sinus bradycardia 59 bpm.  Left bundle branch block with repolarization changes.  May 09, 2018 ECG (independently read by me): Sinus bradycardia 55 bpm.  Left bundle branch block with repolarization changes.  No ectopy.  November 08, 2017 ECG (independently read by me): Normal sinus rhythm at 66 bpm with occasional PACs.  Left bundle branch block with mild repolarization  October 2018 ECG (independently read by me): Sinus bradycardia 56 bpm.  Left axis deviation.  Left bundle branch block.  QTc interval 424 ms.  August 2018 ECG (independently read by me): Sinus rhythm at 90 bpm with frequent PVCs with transient bigeminy.  May 2018 ECG (independently read by me): Sinus rhythm at 97 bpm with frequent PVCs.  Left bundle branch block.  PR interval 142.  QTc interval 457 ms.  January 2018 ECG (independently read by me): Sinus rhythm at 64 bpm.  Occasional isolated PVC followed by compensatory pause.  QTc interval 44 ms.  PR interval 128 ms.  No ST segment changes.  July 2017 ECG (independently read by me): Sinus bradycardia 56 bpm.  Normal intervals.  Previously noted T-wave changes in leads 3 and aVF.  ECG (independently read by me): sinus bradycardia 57.  Normal intervals.  Nondiagnostic T changes in lead 27 Nov 2014 ECG (independently read by me): Sinus bradycardia 52 bpm.  ST changes improved.  QTc interval 381 ms.  07/26/2014 ECG (independently read by me): Normal sinus rhythm at 68 bpm.  There is mild downsloping ST segment depression in leads II, III, and F V4 through V6, which is slightly more progressive since the patient's prior ECG done in [place].  LABS: Laboratory from 11/11/2015 including a lipid panel and CBC were reviewed.  BMP Latest Ref Rng & Units 04/27/2019 08/26/2018 09/29/2016  Glucose 65 - 99 mg/dL 125(H) 114(H) 131(H)  BUN 8 - 27 mg/dL _0 Creatinine 0.57 - 1.00 mg/dL 1.35(H) 1.27(H) 1.18(H)  BUN/Creat Ratio 12 - _1 -  Sodium 134 - 144 mmol/L 142 139 139  Potassium 3.5 - 5.2 mmol/L 5.1 4.5 3.8  Chloride 96 - 106 mmol/L 105 101 106  CO2 20 -  29 mmol/L _2 Calcium 8.7 - 10.3 mg/dL 9.3 9.4 9.1   Hepatic Function Latest Ref Rng & Units 04/27/2019 09/18/2016 02/07/2016  Total Protein 6.0 - 8.5 g/dL 6.8 6.5 6.8  Albumin 3.8 - 4.8 g/dL 4.1 3.9 4.1  AST 0 - 40 IU/L _3 ALT 0 - 32 IU/L _4 Alk Phosphatase 39 - 117 IU/L 97 59 72  Total Bilirubin 0.0 - 1.2 mg/dL 0.2 0.6 0.5  Bilirubin, Direct 0.0 - 0.3 mg/dL - - -   CBC Latest Ref Rng & Units 04/27/2019 09/30/2016 09/29/2016  WBC 3.4 - 10.8 x10E3/uL 10.4 14.1(H) 11.4(H)  Hemoglobin 11.1 - 15.9 g/dL 13.0 10.6(L) 11.0(L)  Hematocrit 34.0 - 46.6 % 39.6 32.4(L) 32.3(L)  Platelets 150 - 450 x10E3/uL 260 170 168   Lab Results  Component Value Date   MCV 88 04/27/2019   MCV 89.3 09/30/2016   MCV 88.0 09/29/2016   Lab Results  Component Value Date   TSH 1.470 04/27/2019  No results found for: HGBA1C  Lipid Panel     Component Value Date/Time   CHOL 124 04/27/2019 0814   TRIG 84 04/27/2019 0814   HDL 47 04/27/2019 0814   CHOLHDL 2.6 04/27/2019 0814   CHOLHDL 3.9  02/14/2015 0400   VLDL 22 02/14/2015 0400   LDLCALC 61 04/27/2019 0814   I personally reviewed the blood work done at [facility] in clinic from 11/02/2017: Potassium 4.6.  Creatinine 1.27.  Normal LFTs.  Cholesterol 144, triglycerides 90, HDL 43, LDL 82.  TSH 0.96.   RADIOLOGY: No results found.  IMPRESSION:  1. Essential hypertension   2. CAD in native artery   3. Hyperlipidemia LDL goal <70   4. H/O Paroxysmal SVT (supraventricular tachycardia) (HCC)     ASSESSMENT AND PLAN:  Ms.Akkerman is a 69 year-old female who has a long-standing history of hypertension, which remotely improved with the addition of amlodipine 5 mg added to her Ziac and lisinopril therapy.  In 2015 she had experienced episodic chest tightness. Cardiac catheterization performed after a nuclear perfusion study raised the possibility of anteroseptal defect demonstrated smooth 40-50% LAD stenosis which did not completely normalize  following intracoronary nitroglycerin, and otherwise normal coronary arteries.  He has remained stable without recurrent episodes of chest pain.  Lipid studies were still elevated in 2017 and  atorvastatin was increased to 40 mg with improved results. She was hospitalized in [place] and was told of having a stroke which was presumed to be of cerebellar origin based on her description of significant symptoms.  She had an MRI, CT, carotid Doppler studies, and echo Doppler study showed normal ejection fraction at 55-60%.  There was no obvious evidence of a PFO on color Doppler or bubble study. A 2 week event monitor in June 2018 showed sinus rhythm with occasional PACs.  There were episodes of sinus tachycardia.  There was one episode of PSVT with heart rates of 144 bpm.  Of note, there was also an episode of sinus tachycardia at 5:30 AM while sleeping.  In the past due to fatigability on metoprolol she was switched to Bystolic.  When I saw her in January 2020, her blood pressure was elevated and lisinopril was further titrated to 30 mg.   Previously she had tolerated Bystolic well but due to the excessive cost of $400 a month when last seen this was switched to carvedilol 12.5 mg twice a day.  Her blood pressure today is well controlled now on her medical regimen consisting of carvedilol 18.75 mg in the morning 12.5 mg in evening, lisinopril 40 mg in the morning, and Cardizem CD 240 mg at bedtime.  She is unaware of any recurrent palpitations or periods of tachycardia.  She has continued to be on atorvastatin 40 mg daily for hyperlipidemia.  Laboratory in October 2020 showed total cholesterol 124, triglycerides 84, HDL 47 and LDL cholesterol 61.  TSH was normal at 1.47.  Her weight is stable.  She has had arthritic issues followed by Dr. Ginette Otto.  She will continue current therapy.  As long as she is stable I will see her in 6 months for reevaluation or sooner problems arise.   Troy Sine, MD,  [facility] 09/20/2019 4:10 PM

## 2019-09-20 ENCOUNTER — Encounter: Payer: Self-pay | Admitting: Cardiovascular Disease

## 2019-10-02 ENCOUNTER — Other Ambulatory Visit: Payer: Self-pay | Admitting: Cardiovascular Disease

## 2019-10-06 ENCOUNTER — Other Ambulatory Visit: Payer: Self-pay | Admitting: Cardiovascular Disease

## 2019-10-23 ENCOUNTER — Other Ambulatory Visit: Payer: Self-pay | Admitting: Cardiovascular Disease

## 2019-10-23 NOTE — Telephone Encounter (Signed)
*  STAT* If patient is at the pharmacy, call can be transferred to refill team.   1. Which medications need to be refilled? (please list name of each medication and dose if known)  lisinopril (ZESTRIL) 40 MG tablet atorvastatin (LIPITOR) 40 MG tablet  2. Which pharmacy/location (including street and city if local pharmacy) is medication to be sent to?  [facility] [id number] - [place], [place] - [address] AT [facility] [address]   3. Do they need a 30 day or 90 day supply? 90

## 2019-10-25 MED ORDER — LISINOPRIL 40 MG PO TABS: 40.0000 mg | ORAL_TABLET | Freq: Every day | ORAL | 3 refills | Status: DC

## 2019-10-25 MED ORDER — ATORVASTATIN CALCIUM 40 MG PO TABS: 40.0000 mg | ORAL_TABLET | Freq: Every day | ORAL | 3 refills | Status: DC

## 2020-03-18 ENCOUNTER — Ambulatory Visit (INDEPENDENT_AMBULATORY_CARE_PROVIDER_SITE_OTHER): Payer: Medicare Other | Admitting: Cardiovascular Disease

## 2020-03-18 ENCOUNTER — Encounter: Payer: Self-pay | Admitting: Cardiovascular Disease

## 2020-03-18 ENCOUNTER — Other Ambulatory Visit: Payer: Self-pay

## 2020-03-18 VITALS — BP 142/72 | HR 56 | Ht 62.0 in | Wt 151.0 lb

## 2020-03-18 DIAGNOSIS — M545 Low back pain, unspecified: Secondary | ICD-10-CM

## 2020-03-18 DIAGNOSIS — E785 Hyperlipidemia, unspecified: Secondary | ICD-10-CM

## 2020-03-18 DIAGNOSIS — I447 Left bundle-branch block, unspecified: Secondary | ICD-10-CM

## 2020-03-18 DIAGNOSIS — I10 Essential (primary) hypertension: Secondary | ICD-10-CM

## 2020-03-18 DIAGNOSIS — I2510 Atherosclerotic heart disease of native coronary artery without angina pectoris: Secondary | ICD-10-CM

## 2020-03-18 DIAGNOSIS — I471 Supraventricular tachycardia: Secondary | ICD-10-CM

## 2020-03-18 DIAGNOSIS — G8929 Other chronic pain: Secondary | ICD-10-CM

## 2020-03-18 MED ORDER — NITROGLYCERIN 0.4 MG SL SUBL: 0.4000 mg | SUBLINGUAL_TABLET | SUBLINGUAL | 3 refills | Status: DC | PRN

## 2020-03-18 NOTE — Progress Notes (Signed)
Patient ID: [name], female   DOB: 21-Apr-1951, 69 y.o.   MRN: [id number]    HPI:  [name] is a 69 y.o. female who is followed by Dr. Ginette Otto in [place], [place].  Her uncle, [name], is one of my patients.  She presents to the office today for a 6 month follow-up cardiology evaluation.   [name] has a history of hypertension dating back for at least 2002, and  recently  has been maintained on lisinopril 10 mg and Ziac 5/6.25 mg daily.  She has experienced episodic chest pressure intermittently and had initially undergone a stress test a  7 years ago.  She also has a history of depression. 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 She had noticed increasing palpitations and also  developed episodes of chest tightness last year. She underwent a nuclear perfusion study in July 2015 She was felt to have a small fixed anteroseptal defect without ischemia.  Ejection fraction was 39%, but was felt that this may not be accurate due to many PVCs during the study.  She subsequently underwent a 2-D echo Doppler study in October 2015 in [facility] , which demonstrated an ejection fraction at 55-60%.  There was mild left ventricular posterior wall thickness.  Check equivocal tissue Doppler assessment.  The peak pressure gradient across her aortic valve was 7.6 mm with a mean pressure gradient of 4.3 mm without evidence for significant aortic stenosis.  The patient states that recently she has noticed more chest tightness and this seems to occur with only walking halfway down her driveway.    When I initially saw her in December 2015,  I added amlodipine 5 mg to help both with blood pressure control as well as potential anti-ischemic benefit.   Due to recurrent symptoms, she underwent a catheterization on 08/03/2014 which revealed smooth 40-50% proximal LAD stenosis after the takeoff of the first diagonal vessel which did not significant normalize following intracoronary  nitroglycerin administration.  Otherwise her coronary anatomy was normal.  She had normal LV function without wall motion abnormalities and evidence for very mild  mitral valve prolapse without significant mitral regurgitation.  She was started on statin therapy with her LDL of 113 attempt to induce plaque stability and regression.  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  She denied any recurrent episodes of chest pain or tightness.  She denies presyncope or syncope.  She was told of having mild osteopenia on bone density imaging.  She underwent a CBC, and lipid study by her primary physician.  Her total cholesterol was 142, HDL 43, LDL 83, and triglycerides 76.  Hemoglobin and hematocrit were 13.2 and 39.1.  She denies any palpitations.She denies any recurrent chest pain.  She does note less shortness of breath.  She is on amlodipine 5 mg Ziac 5/6.25 mg, lisinopril 10 mg daily.  She is now on an increased atorvastatin dose at 40 mg daily.  Lab work from one year ago on 11/08/2014 showed a total cholesterol 236, LDL 163, triglycerides 124.  Her lipid studies have improved under increased atorvastatin dose.    When I saw her in January 2018 she had noticed an occasional pause when she takes her pulse.  She denies any sensation of tachycardia.  She denies presyncope.  She denies syncope.  There are no episodes of chest pain.  She was  need for right knee surgery to be done by Dr. Delfino Lovett.  She underwent her knee surgery vessel by Dr. Delfino Lovett on 09/28/2016.  She had developed some swelling initially post procedure.  She was doing well but on 12/11/2016, she developed right arm, hand weakness, nausea and vomiting.  She was hospitalized in [place] and was diagnosed as having a stroke.  Apparently, during that evaluation, she underwent CT imaging, MRI, carotid duplex imaging and appears that she may have had an echo bubble study.Since her discharge, she has noticed  significant improvement in her neurologic capacity.  According to her description, it sounds as though she was significant dysarthric and may have had a cerebellar infarct in the etiology of her presentation.  She was discharged on baby aspirin 81 mg in addition to her lisinopril 5 mg, Toprol XL 25 mg, atorvastatin 40 mg, amlodipine 5 mg.  when I saw her in follow-up at her last office visit, she denied any episodes of chest pain, was unaware of any atrial fibrillation and had noticed episodes of heart rates in the 90s to 100s.  I was able to obtain records from the patient's hospitalization in [place].  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  On 12/13/2016 an echo Doppler study showed an EF of 55-60% with normal right ventricular size with normal function.  She had mild left atrial dilation, mild MR, aortic sclerosis without stenosis, and mild TR.  She wore an event monitor in June 2018.   This showed sinus rhythm.  She had episodes of sinus tachycardia with PACs, sinus rhythm with PACs, and was an episode of sinus rhythm with PSVT and bigeminal PACs.  Her heart rate had increased to 144 bpm.  During SVT episode.  She also was noted to have sinus tachycardia while sleeping at 5:30 AM for which she was unaware.  When I saw her in follow-up, she had not felt well on the metoprolol and I weaned and ultimately discontinued this and in its place started Bystolic at 5 mg with potential titration to 10 mg as tolerated.  I also discontinued her amlodipine and switch to Cardizem CD 180 mg, both for blood pressure control as well as in attempt to improve rate control.  With this medication adjustment, she has felt significantly better.    When I saw her in October 2018, her ECG was stable with a ventricular rate at 56 bpm.  Her blood pressure was elevated.  She had felt improved on Bystolic 5 mg in addition to Cardizem CD 180 mg.  I  recommended further titration of lisinopril.    I saw her in April 2019 and at that time she felt well with reference to palpitations.  Her ECG showed occasional PACs.    I last saw her in October 2019 and had noted the development of  right lower extremity edema which ultimately resolved when she wore compressions stockings.  She been her primary doctor who is scheduled her for Doppler studies but apparently this had never been done and her edema resolved.  She denies any chest pain or episodes of presyncope or syncope.   When I last saw her in January 2020 she had some issues with urinary incontinence.  She had been given a prescription for Myrbetriq by he physician but she never filled this due to conmcerns about side effects.  She continued to experience right lower extremity swelling occasionally.  Her blood pressure was elevated and I recommended further titration of lisinopril to  30 mg.  I also recommended HCTZ to take on an as-needed basis in light of her leg swelling.  Subsequent laboratory showed a BUN of 15 creatinine 1.27 on August 26, 2018.  I  saw her in July 2020 at which time she was feeling well and denied chest pain, PND or orthopnea.   She brought with her her blood pressure log.  Her blood pressure has been labile and at times has been in the 160s and other times in the 110s to 120s.  She denies any significant leg swelling.  She has not taken the HCTZ.  She can no longer afford Bystolic and wanted to switch and according to her insurance her options are metoprolol tartrate or carvedilol.  In the past she did not tolerate metoprolol due to fatigability.  During that evaluation, I recommended changing Bystolic to carvedilol 12.5 mg twice a day and further titrated lisinopril to 40 mg daily due to her hypertension.  She states at home typically her blood pressure is running in the mid 130s systolically.  She was last evaluated by me in May 15, 2019 at which time she continued to be  hypertensive.  Resting pulse was in the 70s and her ECG confirmed sinus rhythm.  I recommended slight titration of carvedilol to 18.75 mg in the morning and 12.5 mg at night.  With her history of prior SVT and ongoing hypertension I recommended she change her Cardizem CD to 240 mg at bedtime and continue the lisinopril 40 mg.  She developed bradycardia dose reduction of carvedilol will be necessary.  I last saw her in February 2021.  At that time her blood pressure at home has been stable typically running in the 120s to 130 systolically.  She was unaware of any burst of SVT or palpitations.  She has had issues with arthritis of her low back and recently received Depo-Medrol injection by Dr. Tanda Rockers.  She denies any anginal symptoms.  She denies PND orthopnea.  There is no exertional dyspnea.   Since I last saw her, she has had issues with spinal stenosis of her lower lumbar vertebrae.  She has undergone 2 injections of Depo-Medrol and recently was started on meloxicam.  She is scheduled to see Dr. Kyung Rudd next week for follow-up laboratory.  She has been monitoring her blood pressure at home and most of the time blood pressure is very stable less than 130 systolic.  There have been couple instances where it had gotten into the 140s.  She has been on lisinopril 40 mg daily, diltiazem 240 mg, carvedilol 18.75 mg in the morning and 12.5 mg at night and has a prescription for HCTZ.  She has been averaging 1-2 times per month 12.5 mg depending upon swelling or blood pressure.  She continues to be on atorvastatin 40 mg.  She is unaware of any chest pain.  She is unaware of any episodes of heart rate irregularity or tachycardia.  She has not been as active as she had in the past due to her arthritic conditions.  She presents for follow-up evaluation.  Past medical history is notable for hypertension, palpitations, depressive disorder, osteoarthritis.  Past surgical history is notable for partial thyroidectomy while  she was in eighth grade, and recent right knee surgery  Current Outpatient Medications  Medication Sig Dispense Refill  . ARTIFICIAL TEAR OP Apply 1 drop to eye daily as needed (dry eyes).    [name] aspirin EC 81 MG tablet Take 1 tablet (81 mg total)  by mouth daily. 90 tablet 3  . atorvastatin (LIPITOR) 40 MG tablet Take 1 tablet (40 mg total) by mouth daily. 90 tablet 3  . calcium carbonate (OS-CAL) 600 MG TABS tablet Take by mouth.    [name] CALCIUM PO Take 1,200 mg by mouth 2 (two) times daily.    . Cholecalciferol 125 MCG (5000 UT) capsule Take by mouth.    . Coenzyme Q10 (COQ-10) 200 MG CAPS Take 200 mg by mouth daily.    [name] diltiazem (CARDIZEM CD) 240 MG 24 hr capsule TAKE ONE CAPSULE BY MOUTH EVERY DAY 90 capsule 3  . lisinopril (ZESTRIL) 40 MG tablet Take 1 tablet (40 mg total) by mouth daily. 90 tablet 3  . meloxicam (MOBIC) 15 MG tablet Take 15 mg by mouth daily.    . nitroGLYCERIN (NITROSTAT) 0.4 MG SL tablet Place 1 tablet (0.4 mg total) under the tongue every 5 (five) minutes as needed for chest pain. 25 tablet 3  . NON FORMULARY Take by mouth.    . Omega-3 Fatty Acids (FISH OIL ULTRA) 1400 MG CAPS Take 1,400 mg by mouth 2 (two) times daily.    . carvedilol (COREG) 12.5 MG tablet Take 1.5 tablets (18.75 mg total) by mouth every morning AND 1 tablet (12.5 mg total) every evening. 75 tablet 4  . hydrochlorothiazide (MICROZIDE) 12.5 MG capsule Take 1 capsule (12.5 mg total) by mouth as needed (for swelling). 90 capsule 3   No current facility-administered medications for this visit.   Social history is notable in that she is married for >40 years.  She does not have any children.  She lives with her husband.  She completed 12th grade of education.  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  There is no tobacco history or alcohol use.  She does care for many dogs.  Family History  Problem Relation Age of Onset  . Heart failure Mother   . Sudden death  Father   . COPD Father   . Cancer Brother   . Cancer Sister    Family history is notable that her mother died at age 6 with possible congestive heart failure.  Her father died at 1 with sudden death.  He had COPD.  A brother died at 45 with cancer and a sister died at 45 with cancer.   ROS General: Negative; No fevers, chills, or night sweats HEENT: Negative; No changes in vision or hearing, sinus congestion, difficulty swallowing Pulmonary: Negative; No cough, wheezing, shortness of breath, hemoptysis Cardiovascular:  See HPI;  GI: Negative; No nausea, vomiting, diarrhea, or abdominal pain GU: Occasional urinary incontinence Musculoskeletal: Positive for arthritis in her right knee Hematologic/Oncologic: Negative; no easy bruising, bleeding Endocrine: History of partial thyroidectomy Neuro: Negative; no changes in balance, headaches Skin: Negative; No rashes or skin lesions Psychiatric: Prior history of depression, currently stable Sleep: Negative; No daytime sleepiness, hypersomnolence, bruxism, restless legs, hypnogagnic hallucinations Other comprehensive 14 point system review is negative   Physical Exam BP (!) 142/72   Pulse (!) 56   Ht $R'5\' 2"'Qn$  (1.575 m)   Wt 151 lb (68.5 kg)   LMP 07/27/1998 (LMP Unknown)   SpO2 99%   BMI 27.62 kg/m    Repeat blood pressure by me was 132/68  Wt Readings from Last 3 Encounters:  03/18/20 151 lb (68.5 kg)  09/19/19 148 lb 3.2 oz (67.2 kg)  05/15/19 143 lb 3.2 oz (65 kg)   General: Alert, oriented, no  distress.  Skin: normal turgor, no rashes, warm and dry HEENT: Normocephalic, atraumatic. Pupils equal round and reactive to light; sclera anicteric; extraocular muscles intact;  Nose without nasal septal hypertrophy Mouth/Parynx benign; Mallinpatti scale 3 Neck: No JVD, no carotid bruits; normal carotid upstroke Lungs: clear to ausculatation and percussion; no wheezing or rales Chest wall: without tenderness to palpitation Heart:  PMI not displaced, RRR, s1 s2 normal, 1/6 systolic murmur, no diastolic murmur, no rubs, gallops, thrills, or heaves Abdomen: soft, nontender; no hepatosplenomehaly, BS+; abdominal aorta nontender and not dilated by palpation. Back: no CVA tenderness Pulses 2+ Musculoskeletal: full range of motion, normal strength, no joint deformities Extremities: no clubbing cyanosis or edema, Homan's sign negative  Neurologic: grossly nonfocal; Cranial nerves grossly wnl Psychologic: Normal mood and affect   ECG (independently read by me): Sinus bradycardia 56 bpm.  Left axis deviation.  Borderline left bundle branch block.  No ectopy.    February 2021 ECG (independently read by me): Sinus bradycardia 55 bpm, left axis deviation, left bundle branch block.   October 2020 ECG (independently read by me): Sinus ryhthm at 70 with PVCs; nonspecific ST changes; QTc 124 msec  July 2020 ECG (independently read by me): Sinus Bradycardia at 56; NSSTT changes  January 2010 ECG (independently read by me): Sinus bradycardia 59 bpm.  Left bundle branch block with repolarization changes.  May 09, 2018 ECG (independently read by me): Sinus bradycardia 55 bpm.  Left bundle branch block with repolarization changes.  No ectopy.  November 08, 2017 ECG (independently read by me): Normal sinus rhythm at 66 bpm with occasional PACs.  Left bundle branch block with mild repolarization  October 2018 ECG (independently read by me): Sinus bradycardia 56 bpm.  Left axis deviation.  Left bundle branch block.  QTc interval 424 ms.  August 2018 ECG (independently read by me): Sinus rhythm at 90 bpm with frequent PVCs with transient bigeminy.  May 2018 ECG (independently read by me): Sinus rhythm at 97 bpm with frequent PVCs.  Left bundle branch block.  PR interval 142.  QTc interval 457 ms.  January 2018 ECG (independently read by me): Sinus rhythm at 64 bpm.  Occasional isolated PVC followed by compensatory pause.  QTc interval 44  ms.  PR interval 128 ms.  No ST segment changes.  July 2017 ECG (independently read by me): Sinus bradycardia 56 bpm.  Normal intervals.  Previously noted T-wave changes in leads 3 and aVF.  ECG (independently read by me): sinus bradycardia 57.  Normal intervals.  Nondiagnostic T changes in lead 27 Nov 2014 ECG (independently read by me): Sinus bradycardia 52 bpm.  ST changes improved.  QTc interval 381 ms.  07/26/2014 ECG (independently read by me): Normal sinus rhythm at 68 bpm.  There is mild downsloping ST segment depression in leads II, III, and F V4 through V6, which is slightly more progressive since the patient's prior ECG done in [place].  LABS: Laboratory from 11/11/2015 including a lipid panel and CBC were reviewed.  BMP Latest Ref Rng & Units 04/27/2019 08/26/2018 09/29/2016  Glucose 65 - 99 mg/dL 125(H) 114(H) 131(H)  BUN 8 - 27 mg/dL $Remove'17 15 13  'wUegkBc$ Creatinine 0.57 - 1.00 mg/dL 1.35(H) 1.27(H) 1.18(H)  BUN/Creat Ratio 12 - $Re'28 13 12 'ovC$ -  Sodium 134 - 144 mmol/L 142 139 139  Potassium 3.5 - 5.2 mmol/L 5.1 4.5 3.8  Chloride 96 - 106 mmol/L 105 101 106  CO2 20 - 29 mmol/L 24 21 24  Calcium 8.7 - 10.3 mg/dL 9.3 9.4 9.1   Hepatic Function Latest Ref Rng & Units 04/27/2019 09/18/2016 02/07/2016  Total Protein 6.0 - 8.5 g/dL 6.8 6.5 6.8  Albumin 3.8 - 4.8 g/dL 4.1 3.9 4.1  AST 0 - 40 IU/L $Remov'16 26 19  'qQTtKo$ ALT 0 - 32 IU/L $Remov'13 20 15  'YPDJeg$ Alk Phosphatase 39 - 117 IU/L 97 59 72  Total Bilirubin 0.0 - 1.2 mg/dL 0.2 0.6 0.5  Bilirubin, Direct 0.0 - 0.3 mg/dL - - -   CBC Latest Ref Rng & Units 04/27/2019 09/30/2016 09/29/2016  WBC 3.4 - 10.8 x10E3/uL 10.4 14.1(H) 11.4(H)  Hemoglobin 11.1 - 15.9 g/dL 13.0 10.6(L) 11.0(L)  Hematocrit 34.0 - 46.6 % 39.6 32.4(L) 32.3(L)  Platelets 150 - 450 x10E3/uL 260 170 168   Lab Results  Component Value Date   MCV 88 04/27/2019   MCV 89.3 09/30/2016   MCV 88.0 09/29/2016   Lab Results  Component Value Date   TSH 1.470 04/27/2019  No results found for: HGBA1C   Lipid Panel     Component Value Date/Time   CHOL 124 04/27/2019 0814   TRIG 84 04/27/2019 0814   HDL 47 04/27/2019 0814   CHOLHDL 2.6 04/27/2019 0814   CHOLHDL 3.9 02/14/2015 0400   VLDL 22 02/14/2015 0400   LDLCALC 61 04/27/2019 0814   I personally reviewed the blood work done at [facility] from 11/02/2017: Potassium 4.6.  Creatinine 1.27.  Normal LFTs.  Cholesterol 144, triglycerides 90, HDL 43, LDL 82.  TSH 0.96.   RADIOLOGY: No results found.  IMPRESSION:  1. Essential hypertension   2. CAD in native artery   3. Hyperlipidemia LDL goal <70   4. Left bundle branch block   5. H/O Paroxysmal SVT (supraventricular tachycardia) (HCC)   6. Chronic low back pain without sciatica, unspecified back pain laterality     ASSESSMENT AND PLAN:  Ms.Heber is a 69 year-old female who has a long-standing history of hypertension, which remotely improved with the addition of amlodipine 5 mg added to her Ziac and lisinopril therapy.  In 2015 she had experienced episodic chest tightness. Cardiac catheterization performed after a nuclear perfusion study raised the possibility of anteroseptal defect demonstrated smooth 40-50% LAD stenosis which did not completely normalize following intracoronary nitroglycerin, and otherwise normal coronary arteries.  She has remained stable without recurrent episodes of chest pain.  Lipid studies were still elevated in 2017 and  atorvastatin was increased to 40 mg with improved results. She was hospitalized in [place] and was told of having a stroke which was presumed to be of cerebellar origin based on her description of significant symptoms.  She had an MRI, CT, carotid Doppler studies, and echo Doppler study showed normal ejection fraction at 55-60%.  There was no obvious evidence of a PFO on color Doppler or bubble study. A 2 week event monitor in June 2018 showed sinus rhythm with occasional PACs.  There were episodes of sinus tachycardia.  There  was one episode of PSVT with heart rates of 144 bpm.  Of note, there was also an episode of sinus tachycardia at 5:30 AM while sleeping.  In the past due to fatigability on metoprolol she was switched to Bystolic.  When I saw her in January 2020, her blood pressure was elevated and lisinopril was further titrated to 30 mg.  Over the past 6 months, she has been on medical regimen consisting of carvedilol 18.75 mg in the morning and 12.5 mg at night,  long-acting diltiazem 240 mg daily, and lisinopril 40 mg daily.  She is taking HCTZ 12.5 mg approximately 1-2 times per month for leg swelling her blood pressure elevation greater than 140.  She was recently started on meloxicam which can contribute to slight increased blood pressure and leg swelling.  She will be undergoing laboratory next week with Dr. Ginette Otto.  Her blood pressure initially today was 142/72 and on repeat by me was 138/70.  I have suggested that she can take HCTZ 75.4 mg if her systolic blood pressure is greater than 135.  She continues to be on atorvastatin 40 mg daily.  She is not having any anginal symptomatology.  I did renew her sublingual nitroglycerin prescription today.  Her ECG remained stable and shows sinus rhythm without ectopy.  She is unaware of any episodes of palpitations on her current medical regimen.  I will see her in 6 months for follow-up evaluation or sooner as needed.  Troy Sine, MD, [facility] 03/18/2020 9:49 AM

## 2020-03-18 NOTE — Patient Instructions (Signed)

## 2020-03-20 NOTE — Addendum Note (Signed)
Addended by: Orlene Och on: 03/20/2020 09:52 AM   Modules accepted: Orders

## 2020-04-24 ENCOUNTER — Other Ambulatory Visit: Payer: Self-pay

## 2020-04-24 MED ORDER — CARVEDILOL 12.5 MG PO TABS: ORAL_TABLET | ORAL | 4 refills | Status: DC

## 2020-04-27 ENCOUNTER — Other Ambulatory Visit: Payer: Self-pay | Admitting: Cardiovascular Disease

## 2020-05-29 ENCOUNTER — Other Ambulatory Visit: Payer: Self-pay | Admitting: Cardiovascular Disease

## 2020-09-19 ENCOUNTER — Other Ambulatory Visit: Payer: Self-pay

## 2020-09-19 ENCOUNTER — Ambulatory Visit (INDEPENDENT_AMBULATORY_CARE_PROVIDER_SITE_OTHER): Payer: Medicare Other | Admitting: Cardiovascular Disease

## 2020-09-19 ENCOUNTER — Encounter: Payer: Self-pay | Admitting: Cardiovascular Disease

## 2020-09-19 DIAGNOSIS — I2510 Atherosclerotic heart disease of native coronary artery without angina pectoris: Secondary | ICD-10-CM | POA: Diagnosis not present

## 2020-09-19 DIAGNOSIS — E890 Postprocedural hypothyroidism: Secondary | ICD-10-CM | POA: Diagnosis not present

## 2020-09-19 DIAGNOSIS — Z79899 Other long term (current) drug therapy: Secondary | ICD-10-CM

## 2020-09-19 DIAGNOSIS — I471 Supraventricular tachycardia: Secondary | ICD-10-CM

## 2020-09-19 DIAGNOSIS — E785 Hyperlipidemia, unspecified: Secondary | ICD-10-CM

## 2020-09-19 DIAGNOSIS — I10 Essential (primary) hypertension: Secondary | ICD-10-CM | POA: Diagnosis not present

## 2020-09-19 MED ORDER — HYDRALAZINE HCL 25 MG PO TABS: 25.0000 mg | ORAL_TABLET | Freq: Two times a day (BID) | ORAL | 3 refills | Status: DC

## 2020-09-19 NOTE — Progress Notes (Signed)
Patient ID: [name], female   DOB: [birth year], 70 y.o.   MRN: [id number]    HPI:  [name] is a 70 y.o. female who is followed by Dr. Tanda Rockers in [place], [place].  Her uncle, [name], is one of my patients.  She presents to the office today for a 6 month follow-up cardiology evaluation.   [name] has a history of hypertension dating back for at least 2002, and remotely was maintained on lisinopril 10 mg and Ziac 5/6.25 mg daily.  She has experienced episodic chest pressure intermittently and had initially undergone a stress test a  7 years ago.  She also has a history of depression. 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 She had noticed increasing palpitations and also  developed episodes of chest tightness last year. She underwent a nuclear perfusion study in July 2015 She was felt to have a small fixed anteroseptal defect without ischemia.  Ejection fraction was 39%, but was felt that this may not be accurate due to many PVCs during the study.  She subsequently underwent a 2-D echo Doppler study in October 2015 in [place] , which demonstrated an ejection fraction at 55-60%.  There was mild left ventricular posterior wall thickness.  Check equivocal tissue Doppler assessment.  The peak pressure gradient across her aortic valve was 7.6 mm with a mean pressure gradient of 4.3 mm without evidence for significant aortic stenosis.  The patient states that recently she has noticed more chest tightness and this seems to occur with only walking halfway down her driveway.    When I initially saw her in December 2015,  I added amlodipine 5 mg to help both with blood pressure control as well as potential anti-ischemic benefit.   Due to recurrent symptoms, she underwent a catheterization on 08/03/2014 which revealed smooth 40-50% proximal LAD stenosis after the takeoff of the first diagonal vessel which did not significant normalize following intracoronary  nitroglycerin administration.  Otherwise her coronary anatomy was normal.  She had normal LV function without wall motion abnormalities and evidence for very mild  mitral valve prolapse without significant mitral regurgitation.  She was started on statin therapy with her LDL of 113 attempt to induce plaque stability and regression.  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  She denied any recurrent episodes of chest pain or tightness.  She denies presyncope or syncope.  She was told of having mild osteopenia on bone density imaging.  She underwent a CBC, and lipid study by her primary physician.  Her total cholesterol was 142, HDL 43, LDL 83, and triglycerides 76.  Hemoglobin and hematocrit were 13.2 and 39.1.  She denies any palpitations.She denies any recurrent chest pain.  She does note less shortness of breath.  She is on amlodipine 5 mg Ziac 5/6.25 mg, lisinopril 10 mg daily.  She is now on an increased atorvastatin dose at 40 mg daily.  Lab work from one year ago on 11/08/2014 showed a total cholesterol 236, LDL 163, triglycerides 124.  Her lipid studies have improved under increased atorvastatin dose.    When I saw her in January 2018 she had noticed an occasional pause when she takes her pulse.  She denies any sensation of tachycardia.  She denies presyncope.  She denies syncope.  There are no episodes of chest pain.  She was  need for right knee surgery to be done by Dr. Veda Canning.  She underwent her  knee surgery vessel by Dr. Delfino Lovett on 09/28/2016.  She had developed some swelling initially post procedure.  She was doing well but on 12/11/2016, she developed right arm, hand weakness, nausea and vomiting.  She was hospitalized in [place] and was diagnosed as having a stroke.  Apparently, during that evaluation, she underwent CT imaging, MRI, carotid duplex imaging and appears that she may have had an echo bubble study.Since her discharge, she has noticed  significant improvement in her neurologic capacity.  According to her description, it sounds as though she was significant dysarthric and may have had a cerebellar infarct in the etiology of her presentation.  She was discharged on baby aspirin 81 mg in addition to her lisinopril 5 mg, Toprol XL 25 mg, atorvastatin 40 mg, amlodipine 5 mg.  when I saw her in follow-up at her last office visit, she denied any episodes of chest pain, was unaware of any atrial fibrillation and had noticed episodes of heart rates in the 90s to 100s.  I was able to obtain records from the patient's hospitalization in [place].  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  On 12/13/2016 an echo Doppler study showed an EF of 55-60% with normal right ventricular size with normal function.  She had mild left atrial dilation, mild MR, aortic sclerosis without stenosis, and mild TR.  She wore an event monitor in June 2018 which showed sinus rhythm.  She had episodes of sinus tachycardia with PACs, sinus rhythm with PACs, and was an episode of sinus rhythm with PSVT and bigeminal PACs.  Her heart rate had increased to 144 bpm.  During SVT episode.  She also was noted to have sinus tachycardia while sleeping at 5:30 AM for which she was unaware.  When I saw her in follow-up, she had not felt well on the metoprolol and I weaned and ultimately discontinued this and in its place started Bystolic at 5 mg with potential titration to 10 mg as tolerated.  I also discontinued her amlodipine and switch to Cardizem CD 180 mg, both for blood pressure control as well as in attempt to improve rate control.  With this medication adjustment, she has felt significantly better.    When I saw her in October 2018, her ECG was stable with a ventricular rate at 56 bpm.  Her blood pressure was elevated.  She had felt improved on Bystolic 5 mg in addition to Cardizem CD 180 mg.  I  recommended further titration of lisinopril.    I saw her in April 2019 and at that time she felt well with reference to palpitations.  Her ECG showed occasional PACs.    I  saw her in October 2019 and had noted the development of  right lower extremity edema which ultimately resolved when she wore compressions stockings.  She been her primary doctor who is scheduled her for Doppler studies but apparently this had never been done and her edema resolved.  She denies any chest pain or episodes of presyncope or syncope.   When evaluated in January 2020 she had some issues with urinary incontinence.  She had been given a prescription for Myrbetriq by he physician but she never filled this due to conmcerns about side effects.  She continued to experience right lower extremity swelling occasionally.  Her blood pressure was elevated and I recommended further titration of lisinopril to 30 mg.  I also recommended HCTZ to  take on an as-needed basis in light of her leg swelling.  Subsequent laboratory showed a BUN of 15 creatinine 1.27 on August 26, 2018.  I saw her in July 2020 at which time she was feeling well and denied chest pain, PND or orthopnea.   She brought with her her blood pressure log.  Her blood pressure has been labile and at times has been in the 160s and other times in the 110s to 120s.  She denies any significant leg swelling.  She has not taken the HCTZ.  She can no longer afford Bystolic and wanted to switch and according to her insurance her options are metoprolol tartrate or carvedilol.  In the past she did not tolerate metoprolol due to fatigability.  During that evaluation, I recommended changing Bystolic to carvedilol 55.7 mg twice a day and further titrated lisinopril to 40 mg daily due to her hypertension.  She states at home typically her blood pressure is running in the mid 322G systolically.  She was  evaluated by me on May 15, 2019 at which time she continued to be hypertensive.   Resting pulse was in the 70s and her ECG confirmed sinus rhythm.  I recommended slight titration of carvedilol to 18.75 mg in the [facility] and 12.5 mg at night.  With her history of prior SVT and ongoing hypertension I recommended she change her Cardizem CD to 240 mg at bedtime and continue the lisinopril 40 mg.  She developed bradycardia dose reduction of carvedilol will be necessary.  When I saw her in February 2021 her blood pressure at home has been stable typically running in the 254Y to 706 systolically.  She was unaware of any burst of SVT or palpitations.  She has had issues with arthritis of her low back and recently received Depo-Medrol injection by Dr. Ginette Otto.  She denies any anginal symptoms.  She denies PND orthopnea.  There is no exertional dyspnea.   I last saw her in August 2021 and prior to that evaluation she had issues with spinal stenosis of her lower lumbar vertebrae.  She has undergone 2 injections of Depo-Medrol and recently was started on meloxicam.  She was to see Dr. Elpidio Galea next week for follow-up laboratory.  She has been monitoring her blood pressure at home and most of the time blood pressure is very stable less than 237 systolic.  There have been couple instances where it had gotten into the 140s.  She has been on lisinopril 40 mg daily, diltiazem 240 mg, carvedilol 18.75 mg in the [facility] and 12.5 mg at night and has a prescription for HCTZ.  She has been averaging 1-2 times per month 12.5 mg depending upon swelling or blood pressure.  She continues to be on atorvastatin 40 mg.  She is unaware of any chest pain.  She is unaware of any episodes of heart rate irregularity or tachycardia.  She has not been as active as she had in the past due to her arthritic conditions.  Since she had not been taking her hydrochlorothiazide and her blood pressure was mildly elevated and I suggested that she take HCTZ if her blood pressure was greater than 135.  Since I last saw her, she is no longer  taking meloxicam and has been taking Tylenol for some arthritic issues.  She has been taking hydrochlorothiazide as a as needed basis.  She is unaware of any palpitations or rhythm disturbance.  She denies presyncope or syncope.  She presents for evaluation.  Past medical history is notable for hypertension, palpitations, depressive disorder, osteoarthritis.  Past surgical history is notable for partial thyroidectomy while she was in eighth grade, and recent right knee surgery  Current Outpatient Medications  Medication Sig Dispense Refill  . ARTIFICIAL TEAR OP Apply 1 drop to eye daily as needed (dry eyes).    [name] aspirin EC 81 MG tablet Take 1 tablet (81 mg total) by mouth daily. 90 tablet 3  . atorvastatin (LIPITOR) 40 MG tablet Take 1 tablet (40 mg total) by mouth daily. 90 tablet 3  . Biotin 1 MG CAPS Take by mouth.    . calcium carbonate (OS-CAL) 600 MG TABS tablet Take by mouth.    [name] CALCIUM PO Take 1,200 mg by mouth 2 (two) times daily.    . carvedilol (COREG) 12.5 MG tablet TAKE 1 AND 1/2 TABLETS EVERY [facility] AND 1 TABLET EVERY EVENING 75 tablet 4  . Cholecalciferol 125 MCG (5000 UT) capsule Take by mouth.    . Coenzyme Q10 (COQ-10) 200 MG CAPS Take 200 mg by mouth daily.    [name] diltiazem (CARDIZEM CD) 240 MG 24 hr capsule TAKE ONE CAPSULE BY MOUTH EVERY DAY 90 capsule 3  . hydrALAZINE (APRESOLINE) 25 MG tablet Take 1 tablet (25 mg total) by mouth in the [facility] and at bedtime. 180 tablet 3  . lisinopril (ZESTRIL) 40 MG tablet Take 1 tablet (40 mg total) by mouth daily. 90 tablet 3  . nitroGLYCERIN (NITROSTAT) 0.4 MG SL tablet Place 1 tablet (0.4 mg total) under the tongue every 5 (five) minutes as needed for chest pain. 25 tablet 3  . NON FORMULARY Take by mouth.    . Omega-3 Fatty Acids (FISH OIL ULTRA) 1400 MG CAPS Take 1,400 mg by mouth 2 (two) times daily.    . hydrochlorothiazide (MICROZIDE) 12.5 MG capsule Take 1 capsule (12.5 mg total) by mouth as needed (for swelling). 90  capsule 3   No current facility-administered medications for this visit.   Social history is notable in that she is married for >40 years.  She does not have any children.  She lives with her husband.  She completed 12th grade of education.  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  There is no tobacco history or alcohol use.  She does care for many dogs.  Family History  Problem Relation Age of Onset  . Heart failure Mother   . Sudden death Father   . COPD Father   . Cancer Brother   . Cancer Sister    Family history is notable that her mother died at age 38 with possible congestive heart failure.  Her father died at 30 with sudden death.  He had COPD.  A brother died at 77 with cancer and a sister died at 37 with cancer.   ROS General: Negative; No fevers, chills, or night sweats HEENT: Negative; No changes in vision or hearing, sinus congestion, difficulty swallowing Pulmonary: Negative; No cough, wheezing, shortness of breath, hemoptysis Cardiovascular:  See HPI;  GI: Negative; No nausea, vomiting, diarrhea, or abdominal pain GU: Occasional urinary incontinence Musculoskeletal: Positive for arthritis in her right knee Hematologic/Oncologic: Negative; no easy bruising, bleeding Endocrine: History of partial thyroidectomy Neuro: Negative; no changes in balance, headaches Skin: Negative; No rashes or skin lesions Psychiatric: Prior history of depression, currently stable Sleep: Negative; No daytime sleepiness, hypersomnolence, bruxism, restless legs, hypnogagnic hallucinations Other comprehensive 14 point system review is negative  Physical Exam BP (!) 168/62   Pulse 60   Ht $R'5\' 2"'cW$  (1.575 m)   Wt 148 lb 12.8 oz (67.5 kg)   LMP 07/27/1998 (LMP Unknown)   SpO2 98%   BMI 27.22 kg/m    Repeat blood pressure by me was elevated at 160/70  Wt Readings from Last 3 Encounters:  09/19/20 148 lb 12.8 oz (67.5 kg)  03/18/20 151 lb (68.5  kg)  09/19/19 148 lb 3.2 oz (67.2 kg)   General: Alert, oriented, no distress.  Skin: normal turgor, no rashes, warm and dry HEENT: Normocephalic, atraumatic. Pupils equal round and reactive to light; sclera anicteric; extraocular muscles intact;  Nose without nasal septal hypertrophy Mouth/Parynx benign; Mallinpatti scale 3 Neck: No JVD, no carotid bruits; normal carotid upstroke Lungs: clear to ausculatation and percussion; no wheezing or rales Chest wall: without tenderness to palpitation Heart: PMI not displaced, RRR, s1 s2 normal, 1/6 systolic murmur, no diastolic murmur, no rubs, gallops, thrills, or heaves Abdomen: soft, nontender; no hepatosplenomehaly, BS+; abdominal aorta nontender and not dilated by palpation. Back: no CVA tenderness Pulses 2+ Musculoskeletal: full range of motion, normal strength, no joint deformities Extremities: no clubbing cyanosis or edema, Homan's sign negative  Neurologic: grossly nonfocal; Cranial nerves grossly wnl Psychologic: Normal mood and affect   ECG (independently read by me): NSR at 60; LBBB; QTc 424 msec, no ectopy  August 2021 ECG (independently read by me): Sinus bradycardia 56 bpm.  Left axis deviation.  Borderline left bundle branch block.  No ectopy.    February 2021 ECG (independently read by me): Sinus bradycardia 55 bpm, left axis deviation, left bundle branch block.   October 2020 ECG (independently read by me): Sinus ryhthm at 70 with PVCs; nonspecific ST changes; QTc 124 msec  July 2020 ECG (independently read by me): Sinus Bradycardia at 56; NSSTT changes  January 2010 ECG (independently read by me): Sinus bradycardia 59 bpm.  Left bundle branch block with repolarization changes.  May 09, 2018 ECG (independently read by me): Sinus bradycardia 55 bpm.  Left bundle branch block with repolarization changes.  No ectopy.  November 08, 2017 ECG (independently read by me): Normal sinus rhythm at 66 bpm with occasional PACs.  Left  bundle branch block with mild repolarization  October 2018 ECG (independently read by me): Sinus bradycardia 56 bpm.  Left axis deviation.  Left bundle branch block.  QTc interval 424 ms.  August 2018 ECG (independently read by me): Sinus rhythm at 90 bpm with frequent PVCs with transient bigeminy.  May 2018 ECG (independently read by me): Sinus rhythm at 97 bpm with frequent PVCs.  Left bundle branch block.  PR interval 142.  QTc interval 457 ms.  January 2018 ECG (independently read by me): Sinus rhythm at 64 bpm.  Occasional isolated PVC followed by compensatory pause.  QTc interval 44 ms.  PR interval 128 ms.  No ST segment changes.  July 2017 ECG (independently read by me): Sinus bradycardia 56 bpm.  Normal intervals.  Previously noted T-wave changes in leads 3 and aVF.  ECG (independently read by me): sinus bradycardia 57.  Normal intervals.  Nondiagnostic T changes in lead 27 Nov 2014 ECG (independently read by me): Sinus bradycardia 52 bpm.  ST changes improved.  QTc interval 381 ms.  07/26/2014 ECG (independently read by me): Normal sinus rhythm at 68 bpm.  There is mild downsloping ST segment depression in leads II, III, and F V4 through V6, which is slightly more progressive  since the patient's prior ECG done in [place].  LABS: Laboratory from 11/11/2015 including a lipid panel and CBC were reviewed.  BMP Latest Ref Rng & Units 09/19/2020 04/27/2019 08/26/2018  Glucose 65 - 99 mg/dL 131(H) 125(H) 114(H)  BUN 8 - 27 mg/dL $Remove'20 17 15  'ECMahen$ Creatinine 0.57 - 1.00 mg/dL 1.42(H) 1.35(H) 1.27(H)  BUN/Creat Ratio 12 - $Re'28 14 13 12  'WMP$ Sodium 134 - 144 mmol/L 141 142 139  Potassium 3.5 - 5.2 mmol/L 4.4 5.1 4.5  Chloride 96 - 106 mmol/L 101 105 101  CO2 20 - 29 mmol/L $RemoveB'23 24 21  'uMJLtrgT$ Calcium 8.7 - 10.3 mg/dL 10.5(H) 9.3 9.4   Hepatic Function Latest Ref Rng & Units 09/19/2020 04/27/2019 09/18/2016  Total Protein 6.0 - 8.5 g/dL 6.5 6.8 6.5  Albumin 3.7 - 4.7 g/dL 4.4 4.1 3.9  AST 0 - 40 IU/L $Remov'15 16  26  'dUFwTD$ ALT 0 - 32 IU/L $Remov'13 13 20  'gRwrQY$ Alk Phosphatase 44 - 121 IU/L 91 97 59  Total Bilirubin 0.0 - 1.2 mg/dL 0.3 0.2 0.6  Bilirubin, Direct 0.0 - 0.3 mg/dL - - -   CBC Latest Ref Rng & Units 09/19/2020 04/27/2019 09/30/2016  WBC 3.4 - 10.8 x10E3/uL 6.5 10.4 14.1(H)  Hemoglobin 11.1 - 15.9 g/dL 13.2 13.0 10.6(L)  Hematocrit 34.0 - 46.6 % 39.5 39.6 32.4(L)  Platelets 150 - 450 x10E3/uL 278 260 170   Lab Results  Component Value Date   MCV 91 09/19/2020   MCV 88 04/27/2019   MCV 89.3 09/30/2016   Lab Results  Component Value Date   TSH 1.010 09/19/2020  No results found for: HGBA1C  Lipid Panel     Component Value Date/Time   CHOL 124 04/27/2019 0814   TRIG 84 04/27/2019 0814   HDL 47 04/27/2019 0814   CHOLHDL 2.6 04/27/2019 0814   CHOLHDL 3.9 02/14/2015 0400   VLDL 22 02/14/2015 0400   LDLCALC 61 04/27/2019 0814   I personally reviewed the blood work done at [facility] from 11/02/2017: Potassium 4.6.  Creatinine 1.27.  Normal LFTs.  Cholesterol 144, triglycerides 90, HDL 43, LDL 82.  TSH 0.96.   RADIOLOGY: No results found.  IMPRESSION:  1. Essential hypertension   2. CAD in native artery   3. History of partial thyroidectomy   4. H/O Paroxysmal SVT (supraventricular tachycardia) (HCC)   5. Medication management   6. Hyperlipidemia LDL goal <70     ASSESSMENT AND PLAN:  Ms.Lafountain is a 70 year-old female who has a long-standing history of hypertension, which remotely improved with the addition of amlodipine 5 mg added to her Ziac and lisinopril therapy.  In 2015 she had experienced episodic chest tightness. Cardiac catheterization performed after a nuclear perfusion study raised the possibility of anteroseptal defect demonstrated smooth 40-50% LAD stenosis which did not completely normalize following intracoronary nitroglycerin, and otherwise normal coronary arteries.  She has remained stable without recurrent episodes of chest pain.  Lipid studies were still  elevated in 2017 and  atorvastatin was increased to 40 mg with improved results. She was hospitalized in [place] and was told of having a stroke which was presumed to be of cerebellar origin based on her description of significant symptoms.  She had an MRI, CT, carotid Doppler studies, and echo Doppler study showed normal ejection fraction at 55-60%.  There was no obvious evidence of a PFO on color Doppler or bubble study. A 2 week event monitor in June 2018 showed sinus rhythm with occasional PACs.  There were episodes of sinus tachycardia.  There was one episode of PSVT with heart rates of 144 bpm.  Of note, there was also an episode of sinus tachycardia at 5:30 AM while sleeping.  In the past due to fatigability on metoprolol she was switched to Bystolic.  When I saw her in January 2020, her blood pressure was elevated and lisinopril was further titrated to 30 mg.  She had been on meloxicam for arthritic issues and this ultimately was discontinued.  She is now on a regimen consisting of carvedilol 18.75 mg in the [facility] and 12.5 mg at night, diltiazem 240 mg daily, lisinopril 40 mg and has the prescription for HCTZ 12.5 mg which she takes on an as-needed basis.  Blood pressure is elevated.  Her resting pulse is 60.  I am adding hydralazine 25 mg twice a day to her medical regimen.  She continues to be on atorvastatin 40 mg for hyperlipidemia as well as fish oil twice daily..  Her primary physician has been checking laboratory.  She is nonfasting today but I will check a comprehensive metabolic panel CBC and TSH for further assessment.  She will monitor her blood pressure.  I will see her in 4 months for reevaluation or sooner as needed.   Troy Sine, MD, [facility] 09/21/2020 5:04 PM

## 2020-09-19 NOTE — Patient Instructions (Signed)
Medication Instructions:  START- Hydralazine 25 mg by mouth twice a day  *If you need a refill on your cardiac medications before your next appointment, please call your pharmacy*   Lab Work: CMP, CBC, TSH  If you have labs (blood work) drawn today and your tests are completely normal, you will receive your results only by: [name] MyChart Message (if you have MyChart) OR . A paper copy in the mail If you have any lab test that is abnormal or we need to change your treatment, we will call you to review the results.   Testing/Procedures: None Ordered   Follow-Up: At [facility], you and your health needs are our priority.  As part of our continuing mission to provide you with exceptional heart care, we have created designated Provider Care Teams.  These Care Teams include your primary Cardiologist (physician) and Advanced Practice Providers (APPs -  Physician Assistants and Nurse Practitioners) who all work together to provide you with the care you need, when you need it.  We recommend signing up for the patient portal called "MyChart".  Sign up information is provided on this After Visit Summary.  MyChart is used to connect with patients for Virtual Visits (Telemedicine).  Patients are able to view lab/test results, encounter notes, upcoming appointments, etc.  Non-urgent messages can be sent to your provider as well.   To learn more about what you can do with MyChart, go to [email or web address].    Your next appointment:   4 month(s)  The format for your next appointment:   In Person  Provider:   You may see Nicki Guadalajara, MD or one of the following Advanced Practice Providers on your designated Care Team:    Azalee Course, PA-C  Micah Flesher, PA-C or   Judy Pimple, [place]

## 2020-09-20 LAB — COMPREHENSIVE METABOLIC PANEL
ALT: 13 IU/L (ref 0–32)
AST: 15 IU/L (ref 0–40)
Albumin/Globulin Ratio: 2.1 (ref 1.2–2.2)
Albumin: 4.4 g/dL (ref 3.7–4.7)
Alkaline Phosphatase: 91 IU/L (ref 44–121)
BUN/Creatinine Ratio: 14 (ref 12–28)
BUN: 20 mg/dL (ref 8–27)
Bilirubin Total: 0.3 mg/dL (ref 0.0–1.2)
CO2: 23 mmol/L (ref 20–29)
Calcium: 10.5 mg/dL — ABNORMAL HIGH (ref 8.7–10.3)
Chloride: 101 mmol/L (ref 96–106)
Creatinine, Ser: 1.42 mg/dL — ABNORMAL HIGH (ref 0.57–1.00)
GFR calc Af Amer: 43 mL/min/{1.73_m2} — ABNORMAL LOW (ref >59)
GFR calc non Af Amer: 37 mL/min/{1.73_m2} — ABNORMAL LOW (ref >59)
Globulin, Total: 2.1 g/dL (ref 1.5–4.5)
Glucose: 131 mg/dL — ABNORMAL HIGH (ref 65–99)
Potassium: 4.4 mmol/L (ref 3.5–5.2)
Sodium: 141 mmol/L (ref 134–144)
Total Protein: 6.5 g/dL (ref 6.0–8.5)

## 2020-09-20 LAB — CBC
Hematocrit: 39.5 % (ref 34.0–46.6)
Hemoglobin: 13.2 g/dL (ref 11.1–15.9)
MCH: 30.3 pg (ref 26.6–33.0)
MCHC: 33.4 g/dL (ref 31.5–35.7)
MCV: 91 fL (ref 79–97)
Platelets: 278 10*3/uL (ref 150–450)
RBC: 4.36 x10E6/uL (ref 3.77–5.28)
RDW: 13.2 % (ref 11.7–15.4)
WBC: 6.5 10*3/uL (ref 3.4–10.8)

## 2020-09-20 LAB — TSH: TSH: 1.01 u[IU]/mL (ref 0.450–4.500)

## 2020-09-21 ENCOUNTER — Encounter: Payer: Self-pay | Admitting: Cardiovascular Disease

## 2020-10-09 ENCOUNTER — Other Ambulatory Visit: Payer: Self-pay | Admitting: Cardiovascular Disease

## 2020-10-19 ENCOUNTER — Other Ambulatory Visit: Payer: Self-pay | Admitting: Cardiovascular Disease

## 2020-12-02 ENCOUNTER — Other Ambulatory Visit: Payer: Self-pay | Admitting: Cardiovascular Disease

## 2020-12-20 ENCOUNTER — Other Ambulatory Visit: Payer: Self-pay | Admitting: Cardiovascular Disease

## 2021-01-04 ENCOUNTER — Other Ambulatory Visit: Payer: Self-pay | Admitting: Cardiovascular Disease

## 2021-01-21 ENCOUNTER — Ambulatory Visit (INDEPENDENT_AMBULATORY_CARE_PROVIDER_SITE_OTHER): Payer: Medicare Other | Admitting: Cardiovascular Disease

## 2021-01-21 ENCOUNTER — Other Ambulatory Visit: Payer: Self-pay

## 2021-01-21 ENCOUNTER — Encounter: Payer: Self-pay | Admitting: Cardiovascular Disease

## 2021-01-21 VITALS — BP 150/60 | HR 57 | Ht 62.0 in | Wt 134.2 lb

## 2021-01-21 DIAGNOSIS — E785 Hyperlipidemia, unspecified: Secondary | ICD-10-CM

## 2021-01-21 DIAGNOSIS — I10 Essential (primary) hypertension: Secondary | ICD-10-CM

## 2021-01-21 DIAGNOSIS — R7303 Prediabetes: Secondary | ICD-10-CM

## 2021-01-21 DIAGNOSIS — I2510 Atherosclerotic heart disease of native coronary artery without angina pectoris: Secondary | ICD-10-CM

## 2021-01-21 DIAGNOSIS — E890 Postprocedural hypothyroidism: Secondary | ICD-10-CM

## 2021-01-21 MED ORDER — HYDRALAZINE HCL 50 MG PO TABS: 50.0000 mg | ORAL_TABLET | Freq: Two times a day (BID) | ORAL | 3 refills | Status: DC

## 2021-01-21 NOTE — Progress Notes (Signed)
Patient ID: [name], female   DOB: 08-12-1950, 70 y.o.   MRN: [id number]    HPI:  [name] is a 70 y.o. female who is followed by [name] in [place], [place].  Her uncle, [name], is one of my patients.  She presents to the office today for a 4 month follow-up cardiology evaluation.   [name] has a history of hypertension dating back for at least 2002, and remotely was maintained on lisinopril 10 mg and Ziac 5/6.25 mg daily.  She has experienced episodic chest pressure intermittently and had initially undergone a stress test a  7 years ago.  She also has a history of depression. 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 She had noticed increasing palpitations and also  developed episodes of chest tightness last year. She underwent a nuclear perfusion study in July 2015 She was felt to have a small fixed anteroseptal defect without ischemia.  Ejection fraction was 39%, but was felt that this may not be accurate due to many PVCs during the study.  She subsequently underwent a 2-D echo Doppler study in October 2015 in [place] , which demonstrated an ejection fraction at 55-60%.  There was mild left ventricular posterior wall thickness.  Check equivocal tissue Doppler assessment.  The peak pressure gradient across her aortic valve was 7.6 mm with a mean pressure gradient of 4.3 mm without evidence for significant aortic stenosis.  The patient states that recently she has noticed more chest tightness and this seems to occur with only walking halfway down her driveway.    When I initially saw her in December 2015,  I added amlodipine 5 mg to help both with blood pressure control as well as potential anti-ischemic benefit.   Due to recurrent symptoms, she underwent a catheterization on 08/03/2014 which revealed smooth 40-50% proximal LAD stenosis after the takeoff of the first diagonal vessel which did not significant normalize following intracoronary  nitroglycerin administration.  Otherwise her coronary anatomy was normal.  She had normal LV function without wall motion abnormalities and evidence for very mild  mitral valve prolapse without significant mitral regurgitation.  She was started on statin therapy with her LDL of 113 attempt to induce plaque stability and regression.  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  She denied any recurrent episodes of chest pain or tightness.  She denies presyncope or syncope.  She was told of having mild osteopenia on bone density imaging.  She underwent a CBC, and lipid study by her primary physician.  Her total cholesterol was 142, HDL 43, LDL 83, and triglycerides 76.  Hemoglobin and hematocrit were 13.2 and 39.1.  She denies any palpitations.She denies any recurrent chest pain.  She does note less shortness of breath.  She is on amlodipine 5 mg Ziac 5/6.25 mg, lisinopril 10 mg daily.  She is now on an increased atorvastatin dose at 40 mg daily.  Lab work from one year ago on 11/08/2014 showed a total cholesterol 236, LDL 163, triglycerides 124.  Her lipid studies have improved under increased atorvastatin dose.    When I saw her in January 2018 she had noticed an occasional pause when she takes her pulse.  She denies any sensation of tachycardia.  She denies presyncope.  She denies syncope.  There are no episodes of chest pain.  She was  need for right knee surgery to be done by Dr. Delfino Kaiser.  She underwent her  knee surgery vessel by Dr. Delfino Kaiser on 09/28/2016.  She had developed some swelling initially post procedure.  She was doing well but on 12/11/2016, she developed right arm, hand weakness, nausea and vomiting.  She was hospitalized in [place] and was diagnosed as having a stroke.  Apparently, during that evaluation, she underwent CT imaging, MRI, carotid duplex imaging and appears that she may have had an echo bubble study.Since her discharge, she has noticed  significant improvement in her neurologic capacity.  According to her description, it sounds as though she was significant dysarthric and may have had a cerebellar infarct in the etiology of her presentation.  She was discharged on baby aspirin 81 mg in addition to her lisinopril 5 mg, Toprol XL 25 mg, atorvastatin 40 mg, amlodipine 5 mg.  when I saw her in follow-up at her last office visit, she denied any episodes of chest pain, was unaware of any atrial fibrillation and had noticed episodes of heart rates in the 90s to 100s.  I was able to obtain records from the patient's hospitalization in [place].  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  On 12/13/2016 an echo Doppler study showed an EF of 55-60% with normal right ventricular size with normal function.  She had mild left atrial dilation, mild MR, aortic sclerosis without stenosis, and mild TR.  She wore an event monitor in June 2018 which showed sinus rhythm.  She had episodes of sinus tachycardia with PACs, sinus rhythm with PACs, and was an episode of sinus rhythm with PSVT and bigeminal PACs.  Her heart rate had increased to 144 bpm.  During SVT episode.  She also was noted to have sinus tachycardia while sleeping at 5:30 AM for which she was unaware.  When I saw her in follow-up, she had not felt well on the metoprolol and I weaned and ultimately discontinued this and in its place started Bystolic at 5 mg with potential titration to 10 mg as tolerated.  I also discontinued her amlodipine and switch to Cardizem CD 180 mg, both for blood pressure control as well as in attempt to improve rate control.  With this medication adjustment, she has felt significantly better.    When I saw her in October 2018, her ECG was stable with a ventricular rate at 56 bpm.  Her blood pressure was elevated.  She had felt improved on Bystolic 5 mg in addition to Cardizem CD 180 mg.  I  recommended further titration of lisinopril.    I saw her in April 2019 and at that time she felt well with reference to palpitations.  Her ECG showed occasional PACs.    I  saw her in October 2019 and had noted the development of  right lower extremity edema which ultimately resolved when she wore compressions stockings.  She been her primary doctor who is scheduled her for Doppler studies but apparently this had never been done and her edema resolved.  She denies any chest pain or episodes of presyncope or syncope.   When evaluated in January 2020 she had some issues with urinary incontinence.  She had been given a prescription for Myrbetriq by he physician but she never filled this due to conmcerns about side effects.  She continued to experience right lower extremity swelling occasionally.  Her blood pressure was elevated and I recommended further titration of lisinopril to 30 mg.  I also recommended HCTZ to  take on an as-needed basis in light of her leg swelling.  Subsequent laboratory showed a BUN of 15 creatinine 1.27 on August 26, 2018.  I saw her in July 2020 at which time she was feeling well and denied chest pain, PND or orthopnea.   She brought with her her blood pressure log.  Her blood pressure has been labile and at times has been in the 160s and other times in the 110s to 120s.  She denies any significant leg swelling.  She has not taken the HCTZ.  She can no longer afford Bystolic and wanted to switch and according to her insurance her options are metoprolol tartrate or carvedilol.  In the past she did not tolerate metoprolol due to fatigability.  During that evaluation, I recommended changing Bystolic to carvedilol 74.9 mg twice a day and further titrated lisinopril to 40 mg daily due to her hypertension.  She states at home typically her blood pressure is running in the mid 449Q systolically.  She was  evaluated by me on May 15, 2019 at which time she continued to be hypertensive.   Resting pulse was in the 70s and her ECG confirmed sinus rhythm.  I recommended slight titration of carvedilol to 18.75 mg in the morning and 12.5 mg at night.  With her history of prior SVT and ongoing hypertension I recommended she change her Cardizem CD to 240 mg at bedtime and continue the lisinopril 40 mg.  She developed bradycardia dose reduction of carvedilol will be necessary.  When I saw her in February 2021 her blood pressure at home has been stable typically running in the 759F to 638 systolically.  She was unaware of any burst of SVT or palpitations.  She has had issues with arthritis of her low back and recently received Depo-Medrol injection by [name].  She denies any anginal symptoms.  She denies PND orthopnea.  There is no exertional dyspnea.   I saw her in August 2021 and prior to that evaluation she had issues with spinal stenosis of her lower lumbar vertebrae.  She has undergone 2 injections of Depo-Medrol and recently was started on meloxicam.  She was to see Dr. Elpidio Galea next week for follow-up laboratory.  She has been monitoring her blood pressure at home and most of the time blood pressure is very stable less than 466 systolic.  There have been couple instances where it had gotten into the 140s.  She has been on lisinopril 40 mg daily, diltiazem 240 mg, carvedilol 18.75 mg in the morning and 12.5 mg at night and has a prescription for HCTZ.  She has been averaging 1-2 times per month 12.5 mg depending upon swelling or blood pressure.  She continues to be on atorvastatin 40 mg.  She is unaware of any chest pain.  She is unaware of any episodes of heart rate irregularity or tachycardia.  She has not been as active as she had in the past due to her arthritic conditions.  Since she had not been taking her hydrochlorothiazide and her blood pressure was mildly elevated and I suggested that she take HCTZ if her blood pressure was greater than 135.  I last saw her in February 2022.  She was no  longer taking meloxicam and has been taking Tylenol for some arthritic issues.  She has been taking hydrochlorothiazide as a as needed basis.  She was unaware of any palpitations or rhythm disturbance.  She denied presyncope or syncope.  During that evaluation,  she was on carvedilol 18.75 mg in the morning and 12.5 mg at night, diltiazem 240 mg daily, lisinopril 40 mg and was taking HCTZ on an as-needed basis.  Her blood pressure was elevated and I added hydralazine 25 mg twice a day to her medical regimen.  She continued to be on atorvastatin 40 mg daily.  Since I last saw her, she underwent successful left knee replacement surgery by Dr. Lebron Quam approximately 7 weeks ago.  She tolerated surgery well.  She denies any chest pain or shortness of breath.  She initially had left leg swelling following her surgery and this has improved.  She has continued to be on carvedilol 18.7 5 in the morning and 12.5 mg in the evening, diltiazem CD2 140 mg daily, lisinopril 40 mg, hydralazine 25 mg twice a day.  She is on atorvastatin for hyperlipidemia.  Lab work 2 months ago showed hemoglobin A1c at 6.3.  She has aggressively been watching her diet ever since.  She presents for evaluation.   Past medical history is notable for hypertension, palpitations, depressive disorder, osteoarthritis.  Past surgical history is notable for partial thyroidectomy while she was in eighth grade, and recent right knee surgery  Current Outpatient Medications  Medication Sig Dispense Refill   ARTIFICIAL TEAR OP Apply 1 drop to eye daily as needed (dry eyes).     aspirin EC 81 MG tablet Take 1 tablet (81 mg total) by mouth daily. 90 tablet 3   atorvastatin (LIPITOR) 40 MG tablet TAKE 1 TABLET(40 MG) BY MOUTH DAILY 90 tablet 3   Biotin 1 MG CAPS Take by mouth.     calcium carbonate (OS-CAL) 600 MG TABS tablet Take by mouth.     CALCIUM PO Take 1,200 mg by mouth 2 (two) times daily.     carvedilol (COREG) 12.5 MG tablet TAKE 1 AND  ONE-HALF TABLETS BY MOUTH EVERY MORNING AND 1 TABLET EVERY EVENING 75 tablet 4   Cholecalciferol 125 MCG (5000 UT) capsule Take by mouth.     Coenzyme Q10 (COQ-10) 200 MG CAPS Take 200 mg by mouth daily.     diltiazem (CARDIZEM CD) 240 MG 24 hr capsule TAKE 1 CAPSULE(240 MG) BY MOUTH DAILY 90 capsule 3   lisinopril (ZESTRIL) 40 MG tablet TAKE 1 TABLET(40 MG) BY MOUTH DAILY 90 tablet 3   nitroGLYCERIN (NITROSTAT) 0.4 MG SL tablet Place 1 tablet (0.4 mg total) under the tongue every 5 (five) minutes as needed for chest pain. 25 tablet 3   NON FORMULARY Take by mouth.     Omega-3 Fatty Acids (FISH OIL ULTRA) 1400 MG CAPS Take 1,400 mg by mouth 2 (two) times daily.     oxyCODONE-acetaminophen (PERCOCET/ROXICET) 5-325 MG tablet Take by mouth.     hydrALAZINE (APRESOLINE) 50 MG tablet Take 1 tablet (50 mg total) by mouth in the morning and at bedtime. 180 tablet 3   hydrochlorothiazide (MICROZIDE) 12.5 MG capsule Take 1 capsule (12.5 mg total) by mouth as needed (for swelling). 90 capsule 3   No current facility-administered medications for this visit.   Social history is notable in that she is married for >40 years.  She does not have any children.  She lives with her husband.  She completed 12th grade of education.  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  There is no tobacco history or alcohol use.  She does care for many dogs.  Family History  Problem Relation Age of Onset  Heart failure Mother    Sudden death Father    COPD Father    Cancer Brother    Cancer Sister    Family history is notable that her mother died at age 98 with possible congestive heart failure.  Her father died at 61 with sudden death.  He had COPD.  A brother died at 1 with cancer and a sister died at 57 with cancer.   ROS General: Negative; No fevers, chills, or night sweats HEENT: Negative; No changes in vision or hearing, sinus congestion, difficulty swallowing Pulmonary:  Negative; No cough, wheezing, shortness of breath, hemoptysis Cardiovascular:  See HPI;  GI: Negative; No nausea, vomiting, diarrhea, or abdominal pain GU: Occasional urinary incontinence Musculoskeletal: Positive for arthritis in her right knee; status post left knee replacement Hematologic/Oncologic: Negative; no easy bruising, bleeding Endocrine: History of partial thyroidectomy Neuro: Negative; no changes in balance, headaches Skin: Negative; No rashes or skin lesions Psychiatric: Prior history of depression, currently stable Sleep: Negative; No daytime sleepiness, hypersomnolence, bruxism, restless legs, hypnogagnic hallucinations Other comprehensive 14 point system review is negative   Physical Exam BP (!) 150/60 (BP Location: Right Arm)   Pulse (!) 57   Ht _0  (1.575 m)   Wt 134 lb 3.2 oz (60.9 kg)   LMP 07/27/1998 (LMP Unknown)   BMI 24.55 kg/m    Repeat blood pressure by me 158/68  Wt Readings from Last 3 Encounters:  01/21/21 134 lb 3.2 oz (60.9 kg)  09/19/20 148 lb 12.8 oz (67.5 kg)  03/18/20 151 lb (68.5 kg)   General: Alert, oriented, no distress.  Skin: normal turgor, no rashes, warm and dry HEENT: Normocephalic, atraumatic. Pupils equal round and reactive to light; sclera anicteric; extraocular muscles intact;  Nose without nasal septal hypertrophy Mouth/Parynx benign; Mallinpatti scale 3 Neck: No JVD, no carotid bruits; normal carotid upstroke Lungs: clear to ausculatation and percussion; no wheezing or rales Chest wall: without tenderness to palpitation Heart: PMI not displaced, RRR, s1 s2 normal, 1/6 systolic murmur, no diastolic murmur, no rubs, gallops, thrills, or heaves Abdomen: soft, nontender; no hepatosplenomehaly, BS+; abdominal aorta nontender and not dilated by palpation. Back: no CVA tenderness Pulses 2+ Musculoskeletal: full range of motion, normal strength, no joint deformities Extremities: no clubbing cyanosis or edema, Homan's sign  negative  Neurologic: grossly nonfocal; Cranial nerves grossly wnl Psychologic: Normal mood and affect   ECG (independently read by me): Sinus Bradycardia at 57, ILBBB  February 2022 ECG (independently read by me): NSR at 60; LBBB; QTc 424 msec, no ectopy  August 2021 ECG (independently read by me): Sinus bradycardia 56 bpm.  Left axis deviation.  Borderline left bundle branch block.  No ectopy.    February 2021 ECG (independently read by me): Sinus bradycardia 55 bpm, left axis deviation, left bundle branch block.   October 2020 ECG (independently read by me): Sinus ryhthm at 70 with PVCs; nonspecific ST changes; QTc 124 msec  July 2020 ECG (independently read by me): Sinus Bradycardia at 56; NSSTT changes  January 2010 ECG (independently read by me): Sinus bradycardia 59 bpm.  Left bundle branch block with repolarization changes.  May 09, 2018 ECG (independently read by me): Sinus bradycardia 55 bpm.  Left bundle branch block with repolarization changes.  No ectopy.  November 08, 2017 ECG (independently read by me): Normal sinus rhythm at 66 bpm with occasional PACs.  Left bundle branch block with mild repolarization  October 2018 ECG (independently read by me): Sinus bradycardia 56 bpm.  Left axis deviation.  Left bundle branch block.  QTc interval 424 ms.  August 2018 ECG (independently read by me): Sinus rhythm at 90 bpm with frequent PVCs with transient bigeminy.  May 2018 ECG (independently read by me): Sinus rhythm at 97 bpm with frequent PVCs.  Left bundle branch block.  PR interval 142.  QTc interval 457 ms.  January 2018 ECG (independently read by me): Sinus rhythm at 64 bpm.  Occasional isolated PVC followed by compensatory pause.  QTc interval 44 ms.  PR interval 128 ms.  No ST segment changes.  July 2017 ECG (independently read by me): Sinus bradycardia 56 bpm.  Normal intervals.  Previously noted T-wave changes in leads 3 and aVF.  ECG (independently read by me):  sinus bradycardia 57.  Normal intervals.  Nondiagnostic T changes in lead 27 Nov 2014 ECG (independently read by me): Sinus bradycardia 52 bpm.  ST changes improved.  QTc interval 381 ms.  07/26/2014 ECG (independently read by me): Normal sinus rhythm at 68 bpm.  There is mild downsloping ST segment depression in leads II, III, and F V4 through V6, which is slightly more progressive since the patient's prior ECG done in [place].  LABS: Laboratory from 11/11/2015 including a lipid panel and CBC were reviewed.  BMP Latest Ref Rng & Units 09/19/2020 04/27/2019 08/26/2018  Glucose 65 - 99 mg/dL 131(H) 125(H) 114(H)  BUN 8 - 27 mg/dL _0 Creatinine 0.57 - 1.00 mg/dL 1.42(H) 1.35(H) 1.27(H)  BUN/Creat Ratio 12 - _1 Sodium 134 - 144 mmol/L 141 142 139  Potassium 3.5 - 5.2 mmol/L 4.4 5.1 4.5  Chloride 96 - 106 mmol/L 101 105 101  CO2 20 - 29 mmol/L _2 Calcium 8.7 - 10.3 mg/dL 10.5(H) 9.3 9.4   Hepatic Function Latest Ref Rng & Units 09/19/2020 04/27/2019 09/18/2016  Total Protein 6.0 - 8.5 g/dL 6.5 6.8 6.5  Albumin 3.7 - 4.7 g/dL 4.4 4.1 3.9  AST 0 - 40 IU/L _3 ALT 0 - 32 IU/L _4 Alk Phosphatase 44 - 121 IU/L 91 97 59  Total Bilirubin 0.0 - 1.2 mg/dL 0.3 0.2 0.6  Bilirubin, Direct 0.0 - 0.3 mg/dL - - -   CBC Latest Ref Rng & Units 09/19/2020 04/27/2019 09/30/2016  WBC 3.4 - 10.8 x10E3/uL 6.5 10.4 14.1(H)  Hemoglobin 11.1 - 15.9 g/dL 13.2 13.0 10.6(L)  Hematocrit 34.0 - 46.6 % 39.5 39.6 32.4(L)  Platelets 150 - 450 x10E3/uL 278 260 170   Lab Results  Component Value Date   MCV 91 09/19/2020   MCV 88 04/27/2019   MCV 89.3 09/30/2016   Lab Results  Component Value Date   TSH 1.010 09/19/2020  No results found for: HGBA1C  Lipid Panel     Component Value Date/Time   CHOL 124 04/27/2019 0814   TRIG 84 04/27/2019 0814   HDL 47 04/27/2019 0814   CHOLHDL 2.6 04/27/2019 0814   CHOLHDL 3.9 02/14/2015 0400   VLDL 22 02/14/2015 0400   LDLCALC 61  04/27/2019 0814   I personally reviewed the blood work done at [facility] from 11/02/2017: Potassium 4.6.  Creatinine 1.27.  Normal LFTs.  Cholesterol 144, triglycerides 90, HDL 43, LDL 82.  TSH 0.96.   RADIOLOGY: No results found.  IMPRESSION:  1. Essential hypertension   2. CAD in native artery   3. History of partial thyroidectomy   4. Hyperlipidemia LDL goal <70  5. Prediabetes     ASSESSMENT AND PLAN:  Ms.Silvio is a 70 year-old female who has a long-standing history of hypertension, which remotely improved with the addition of amlodipine 5 mg added to her Ziac and lisinopril therapy.  In 2015 she had experienced episodic chest tightness. Cardiac catheterization performed after a nuclear perfusion study raised the possibility of anteroseptal defect demonstrated smooth 40-50% LAD stenosis which did not completely normalize following intracoronary nitroglycerin, and otherwise normal coronary arteries.  She has remained stable without recurrent episodes of chest pain.  Lipid studies were still elevated in 2017 and  atorvastatin was increased to 40 mg with improved results. She was hospitalized in [place] and was told of having a stroke which was presumed to be of cerebellar origin based on her description of significant symptoms.  She had an MRI, CT, carotid Doppler studies, and echo Doppler study showed normal ejection fraction at 55-60%.  There was no obvious evidence of a PFO on color Doppler or bubble study. A 2 week event monitor in June 2018 showed sinus rhythm with occasional PACs.  There were episodes of sinus tachycardia.  There was one episode of PSVT with heart rates of 144 bpm.  Of note, there was also an episode of sinus tachycardia at 5:30 AM while sleeping.  In the past due to fatigability on metoprolol she was switched to Bystolic.  At subsequent office visits her medications have been adjusted and most recently she is now on carvedilol 18.75 mg in the morning  and 25 mg at night, diltiazem CD2 140 mg daily, lisinopril 40 mg in addition to hydralazine 25 mg twice daily.  She has been taking HCTZ on a as needed basis for leg swelling.  She tolerated left knee replacement surgery well.  She did experience swelling postoperatively in her left leg for which she had taken the hydrochlorothiazide.  Her blood pressure today continues to be mildly elevated systolically.  I am further titrating hydralazine to 50 mg twice a day from her current dose.  Target blood pressure is less than 130/80.  She continues to be on atorvastatin 40 mg and has been taking over-the-counter fish oil.  LDL cholesterol in October 2020 was 61.  [name] has been checking her laboratory.  She recently underwent hemoglobin A1c prior to her knee surgery which was 6.3.  She has had vigorous dietary adjustment.  She will be seeing [name] in follow-up will be rechecking laboratory.  She denies any chest pain or anginal symptoms.  I will see her in 6 months for cardiology reevaluation or sooner as needed.  Troy Sine, MD, [facility] 01/21/2021 1:53 PM

## 2021-01-21 NOTE — Patient Instructions (Signed)
Medication Instructions:  INCREASE Hydralazine to 50mg  (1 tablet) twice daily.  *If you need a refill on your cardiac medications before your next appointment, please call your pharmacy*   Lab Work: None ordered.    Testing/Procedures: None ordered.    Follow-Up: At [facility], you and your health needs are our priority.  As part of our continuing mission to provide you with exceptional heart care, we have created designated Provider Care Teams.  These Care Teams include your primary Cardiologist (physician) and Advanced Practice Providers (APPs -  Physician Assistants and Nurse Practitioners) who all work together to provide you with the care you need, when you need it.  We recommend signing up for the patient portal called "MyChart".  Sign up information is provided on this After Visit Summary.  MyChart is used to connect with patients for Virtual Visits (Telemedicine).  Patients are able to view lab/test results, encounter notes, upcoming appointments, etc.  Non-urgent messages can be sent to your provider as well.   To learn more about what you can do with MyChart, go to [facility].    Your next appointment:   6 month(s)  The format for your next appointment:   In Person  Provider:   [email or web address], MD

## 2021-05-14 ENCOUNTER — Telehealth: Payer: Self-pay | Admitting: Cardiovascular Disease

## 2021-05-14 NOTE — Telephone Encounter (Signed)
Patient had knee surgery in May and afterwards lost  about 15 lbs. Now patient's BP is low and making the patient feel bad. Patient is taking Lisinopril 40 mg PO, Hydralazine 50 mg BID, Diltiazem 240 mg PO, Coreg 18.75 mg AM and 12.5 mg PM. Patient only taking HCTZ as needed for swelling. Encouraged patient to hold her lisinopril for now and check her BP daily and if it is high to go back to taking her lisinopril. Will send message to Dr. Tresa Endo and Pharm D to further advise. Patient verbalized understanding.

## 2021-05-14 NOTE — Telephone Encounter (Signed)
She takes a few meds for her BP, may need dose decreases given recent weight loss. Hydralazine dose was most recently adjusted, recommend cutting this back from hydralazine 50mg  BID to 25mg  BID. She should keep an eye on BP, if readings are staying low then can stop hydralazine. Would be happy to see pt in HTN clinic too if she'd like (can bring in home cuff so we can confirm accuracy of home readings).

## 2021-05-14 NOTE — Telephone Encounter (Signed)
Pt c/o medication issue:  1. Name of Medication: lisinopril (ZESTRIL) 40 MG tablet  2. How are you currently taking this medication (dosage and times per day)?   TAKE 1 TABLET(40 MG) BY MOUTH DAILY    3. Are you having a reaction (difficulty breathing--STAT)? no  4. What is your medication issue? not feeling well after taking her blood pressure pills, patient is feeling sick, can hardly lift her arms or move around, pt thinks it is the Lisinopril... pts last two readings were  96/43 and 103/46

## 2021-05-14 NOTE — Telephone Encounter (Signed)
Called patient back, advised of message from PharmD.  Patient verbalized understanding. She will call back with BP readings if any issues,.

## 2021-05-27 ENCOUNTER — Other Ambulatory Visit: Payer: Self-pay | Admitting: Cardiovascular Disease

## 2021-08-07 ENCOUNTER — Ambulatory Visit (INDEPENDENT_AMBULATORY_CARE_PROVIDER_SITE_OTHER): Payer: Medicare Other | Admitting: Cardiovascular Disease

## 2021-08-07 ENCOUNTER — Encounter: Payer: Self-pay | Admitting: Cardiovascular Disease

## 2021-08-07 ENCOUNTER — Other Ambulatory Visit: Payer: Self-pay

## 2021-08-07 VITALS — BP 138/60 | HR 74 | Ht 62.0 in | Wt 130.0 lb

## 2021-08-07 DIAGNOSIS — Z79899 Other long term (current) drug therapy: Secondary | ICD-10-CM

## 2021-08-07 DIAGNOSIS — I10 Essential (primary) hypertension: Secondary | ICD-10-CM

## 2021-08-07 DIAGNOSIS — E785 Hyperlipidemia, unspecified: Secondary | ICD-10-CM

## 2021-08-07 DIAGNOSIS — I2510 Atherosclerotic heart disease of native coronary artery without angina pectoris: Secondary | ICD-10-CM

## 2021-08-07 MED ORDER — HYDRALAZINE HCL 25 MG PO TABS: 25.0000 mg | ORAL_TABLET | Freq: Two times a day (BID) | ORAL | 7 refills | Status: DC

## 2021-08-07 NOTE — Progress Notes (Signed)
Patient ID: [name], female   DOB: 23-Nov-1950, 71 y.o.   MRN: [id number]    HPI:  [name] is a 71 y.o. female who is followed by Dr. Ginette Otto in [place], [place].  Her uncle, [name], is one of my patients.  [name] has a history of hypertension dating back for at least 2002, and remotely was maintained on lisinopril 10 mg and Ziac 5/6.25 mg daily.  She has experienced episodic chest pressure intermittently and had initially undergone a stress test a  7 years ago.  She also has a history of depression. 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 She had noticed increasing palpitations and also  developed episodes of chest tightness last year. She underwent a nuclear perfusion study in July 2015 She was felt to have a small fixed anteroseptal defect without ischemia.  Ejection fraction was 39%, but was felt that this may not be accurate due to many PVCs during the study.  She subsequently underwent a 2-D echo Doppler study in October 2015 in [place] , which demonstrated an ejection fraction at 55-60%.  There was mild left ventricular posterior wall thickness.  Check equivocal tissue Doppler assessment.  The peak pressure gradient across her aortic valve was 7.6 mm with a mean pressure gradient of 4.3 mm without evidence for significant aortic stenosis.  The patient states that recently she has noticed more chest tightness and this seems to occur with only walking halfway down her driveway.    When I initially saw her in December 2015,  I added amlodipine 5 mg to help both with blood pressure control as well as potential anti-ischemic benefit.   Due to recurrent symptoms, she underwent a catheterization on 08/03/2014 which revealed smooth 40-50% proximal LAD stenosis after the takeoff of the first diagonal vessel which did not significant normalize following intracoronary  nitroglycerin administration.  Otherwise her coronary anatomy was normal.  She had normal LV function without wall motion abnormalities and evidence for very mild  mitral valve prolapse without significant mitral regurgitation.  She was started on statin therapy with her LDL of 113 attempt to induce plaque stability and regression.  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  She denied any recurrent episodes of chest pain or tightness.  She denies presyncope or syncope.  She was told of having mild osteopenia on bone density imaging.  She underwent a CBC, and lipid study by her primary physician.  Her total cholesterol was 142, HDL 43, LDL 83, and triglycerides 76.  Hemoglobin and hematocrit were 13.2 and 39.1.  She denies any palpitations.She denies any recurrent chest pain.  She does note less shortness of breath.  She is on amlodipine 5 mg Ziac 5/6.25 mg, lisinopril 10 mg daily.  She is now on an increased atorvastatin dose at 40 mg daily.  Lab work from one year ago on 11/08/2014 showed a total cholesterol 236, LDL 163, triglycerides 124.  Her lipid studies have improved under increased atorvastatin dose.    When I saw her in January 2018 she had noticed an occasional pause when she takes her pulse.  She denies any sensation of tachycardia.  She denies presyncope.  She denies syncope.  There are no episodes of chest pain.  She was  need for right knee surgery to be done by Dr. Delfino Lovett.  She underwent her  knee surgery vessel by Dr. Delfino Lovett on 09/28/2016.  She had developed some swelling initially post procedure.  She was doing well but on 12/11/2016, she developed right arm, hand weakness, nausea and vomiting.  She was hospitalized in [place] and was diagnosed as having a stroke.  Apparently, during that evaluation, she underwent CT imaging, MRI, carotid duplex imaging and appears that she may have had an echo bubble study.Since her discharge, she has noticed  significant improvement in her neurologic capacity.  According to her description, it sounds as though she was significant dysarthric and may have had a cerebellar infarct in the etiology of her presentation.  She was discharged on baby aspirin 81 mg in addition to her lisinopril 5 mg, Toprol XL 25 mg, atorvastatin 40 mg, amlodipine 5 mg.  when I saw her in follow-up at her last office visit, she denied any episodes of chest pain, was unaware of any atrial fibrillation and had noticed episodes of heart rates in the 90s to 100s.  I was able to obtain records from the patient's hospitalization in [place].  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  On 12/13/2016 an echo Doppler study showed an EF of 55-60% with normal right ventricular size with normal function.  She had mild left atrial dilation, mild MR, aortic sclerosis without stenosis, and mild TR.  She wore an event monitor in June 2018 which showed sinus rhythm.  She had episodes of sinus tachycardia with PACs, sinus rhythm with PACs, and was an episode of sinus rhythm with PSVT and bigeminal PACs.  Her heart rate had increased to 144 bpm.  During SVT episode.  She also was noted to have sinus tachycardia while sleeping at 5:30 AM for which she was unaware.  When I saw her in follow-up, she had not felt well on the metoprolol and I weaned and ultimately discontinued this and in its place started Bystolic at 5 mg with potential titration to 10 mg as tolerated.  I also discontinued her amlodipine and switch to Cardizem CD 180 mg, both for blood pressure control as well as in attempt to improve rate control.  With this medication adjustment, she has felt significantly better.    When I saw her in October 2018, her ECG was stable with a ventricular rate at 56 bpm.  Her blood pressure was elevated.  She had felt improved on Bystolic 5 mg in addition to Cardizem CD 180 mg.  I  recommended further titration of lisinopril.    I saw her in April 2019 and at that time she felt well with reference to palpitations.  Her ECG showed occasional PACs.    I saw her in October 2019 and had noted the development of  right lower extremity edema which ultimately resolved when she wore compressions stockings.  She been her primary doctor who is scheduled her for Doppler studies but apparently this had never been done and her edema resolved.  She denies any chest pain or episodes of presyncope or syncope.   When evaluated in January 2020 she had some issues with urinary incontinence.  She had been given a prescription for Myrbetriq by he physician but she never filled this due to conmcerns about side effects.  She continued to experience right lower extremity swelling occasionally.  Her blood pressure was elevated and I recommended further titration of lisinopril to 30 mg.  I also recommended HCTZ to take  on an as-needed basis in light of her leg swelling.  Subsequent laboratory showed a BUN of 15 creatinine 1.27 on August 26, 2018.  I saw her in July 2020 at which time she was feeling well and denied chest pain, PND or orthopnea.   She brought with her her blood pressure log.  Her blood pressure has been labile and at times has been in the 160s and other times in the 110s to 120s.  She denies any significant leg swelling.  She has not taken the HCTZ.  She can no longer afford Bystolic and wanted to switch and according to her insurance her options are metoprolol tartrate or carvedilol.  In the past she did not tolerate metoprolol due to fatigability.  During that evaluation, I recommended changing Bystolic to carvedilol 38.2 mg twice a day and further titrated lisinopril to 40 mg daily due to her hypertension.  She states at home typically her blood pressure is running in the mid 505L systolically.  She was evaluated by me on May 15, 2019 at which time she continued to be hypertensive.   Resting pulse was in the 70s and her ECG confirmed sinus rhythm.  I recommended slight titration of carvedilol to 18.75 mg in the morning and 12.5 mg at night.  With her history of prior SVT and ongoing hypertension I recommended she change her Cardizem CD to 240 mg at bedtime and continue the lisinopril 40 mg.  She developed bradycardia dose reduction of carvedilol will be necessary.  When I saw her in February 2021 her blood pressure at home has been stable typically running in the 976B to 341 systolically.  She was unaware of any burst of SVT or palpitations.  She has had issues with arthritis of her low back and recently received Depo-Medrol injection by Dr. Ginette Otto.  She denies any anginal symptoms.  She denies PND orthopnea.  There is no exertional dyspnea.   I saw her in August 2021 and prior to that evaluation she had issues with spinal stenosis of her lower lumbar vertebrae.  She has undergone 2 injections of Depo-Medrol and recently was started on meloxicam.  She was to see Dr. Elpidio Galea next week for follow-up laboratory.  She has been monitoring her blood pressure at home and most of the time blood pressure is very stable less than 937 systolic.  There have been couple instances where it had gotten into the 140s.  She has been on lisinopril 40 mg daily, diltiazem 240 mg, carvedilol 18.75 mg in the morning and 12.5 mg at night and has a prescription for HCTZ.  She has been averaging 1-2 times per month 12.5 mg depending upon swelling or blood pressure.  She continues to be on atorvastatin 40 mg.  She is unaware of any chest pain.  She is unaware of any episodes of heart rate irregularity or tachycardia.  She has not been as active as she had in the past due to her arthritic conditions.  Since she had not been taking her hydrochlorothiazide and her blood pressure was mildly elevated and I suggested that she take HCTZ if her blood pressure was greater than 135.  I last saw her in February 2022.  She was no  longer taking meloxicam and has been taking Tylenol for some arthritic issues.  She has been taking hydrochlorothiazide as a as needed basis.  She was unaware of any palpitations or rhythm disturbance.  She denied presyncope or syncope.  During that evaluation, she was  on carvedilol 18.75 mg in the morning and 12.5 mg at night, diltiazem 240 mg daily, lisinopril 40 mg and was taking HCTZ on an as-needed basis.  Her blood pressure was elevated and I added hydralazine 25 mg twice a day to her medical regimen.  She continued to be on atorvastatin 40 mg daily.  I last saw her in January 21, 2021.  She had undergone successful left knee replacement surgery by Dr. Lebron Quam approximately 7 weeks ago.  She tolerated surgery well.  She denies any chest pain or shortness of breath.  She initially had left leg swelling following her surgery and this has improved.  She has continued to be on carvedilol 18.7 5 in the morning and 12.5 mg in the evening, diltiazem CD2 140 mg daily, lisinopril 40 mg, hydralazine 25 mg twice a day.  She is on atorvastatin for hyperlipidemia.  Lab work 2 months ago showed hemoglobin A1c at 6.3.  She has aggressively been watching her diet.  During that evaluation her blood pressure was mildly elevated and I recommended slight titration of hydralazine to 50 mg twice a day with target blood pressure less than 130/80.  Presently, she feels well.  However she states oftentimes after she takes her morning medicine her blood pressure drops down into the 90s and low 100s and 3 hours later she feels improved.  She is to undergo laboratory with Dr. Ginette Otto.  She continues to have some issues with sciatica.  She denies chest pain.  She is unaware of palpitations.  She presents for evaluation.   Past medical history is notable for hypertension, palpitations, depressive disorder, osteoarthritis.  Past surgical history is notable for partial thyroidectomy while she was in eighth grade, and recent right knee  surgery  Current Outpatient Medications  Medication Sig Dispense Refill   ARTIFICIAL TEAR OP Apply 1 drop to eye daily as needed (dry eyes).     aspirin EC 81 MG tablet Take 1 tablet (81 mg total) by mouth daily. 90 tablet 3   atorvastatin (LIPITOR) 40 MG tablet TAKE 1 TABLET(40 MG) BY MOUTH DAILY 90 tablet 3   Biotin 1 MG CAPS Take by mouth.     calcium carbonate (OS-CAL) 600 MG TABS tablet Take by mouth.     CALCIUM PO Take 1,200 mg by mouth 2 (two) times daily.     carvedilol (COREG) 12.5 MG tablet TAKE 1 AND 1/2 TABLETS BY MOUTH EVERY MORNING AND 1 TABLET EVERY EVENING 75 tablet 4   Cholecalciferol 125 MCG (5000 UT) capsule Take by mouth.     Coenzyme Q10 (COQ-10) 200 MG CAPS Take 200 mg by mouth daily.     diltiazem (CARDIZEM CD) 240 MG 24 hr capsule TAKE 1 CAPSULE(240 MG) BY MOUTH DAILY 90 capsule 3   gabapentin (NEURONTIN) 300 MG capsule Take 300 mg by mouth at bedtime.     hydrALAZINE (APRESOLINE) 50 MG tablet Take 1 tablet (50 mg total) by mouth in the morning and at bedtime. 180 tablet 3   nitroGLYCERIN (NITROSTAT) 0.4 MG SL tablet Place 1 tablet (0.4 mg total) under the tongue every 5 (five) minutes as needed for chest pain. 25 tablet 3   NON FORMULARY Take by mouth.     Omega-3 Fatty Acids (FISH OIL ULTRA) 1400 MG CAPS Take 1,400 mg by mouth 2 (two) times daily.     tiZANidine (ZANAFLEX) 4 MG tablet Take 4 mg by mouth every 6 (six) hours as needed.     traMADol Veatrice Bourbon)  50 MG tablet Take 50 mg by mouth every 6 (six) hours as needed.     hydrochlorothiazide (MICROZIDE) 12.5 MG capsule Take 1 capsule (12.5 mg total) by mouth as needed (for swelling). 90 capsule 3   lisinopril (ZESTRIL) 40 MG tablet TAKE 1 TABLET(40 MG) BY MOUTH DAILY (Patient not taking: Reported on 08/07/2021) 90 tablet 3   oxyCODONE-acetaminophen (PERCOCET/ROXICET) 5-325 MG tablet Take by mouth. (Patient not taking: Reported on 08/07/2021)     No current facility-administered medications for this visit.   Social  history is notable in that she is married for >40 years.  She does not have any children.  She lives with her husband.  She completed 12th grade of education.  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  There is no tobacco history or alcohol use.  She does care for many dogs.  Family History  Problem Relation Age of Onset   Heart failure Mother    Sudden death Father    COPD Father    Cancer Brother    Cancer Sister    Family history is notable that her mother died at age 53 with possible congestive heart failure.  Her father died at 28 with sudden death.  He had COPD.  A brother died at 76 with cancer and a sister died at 90 with cancer.   ROS General: Negative; No fevers, chills, or night sweats HEENT: Negative; No changes in vision or hearing, sinus congestion, difficulty swallowing Pulmonary: Negative; No cough, wheezing, shortness of breath, hemoptysis Cardiovascular:  See HPI;  GI: Negative; No nausea, vomiting, diarrhea, or abdominal pain GU: Occasional urinary incontinence Musculoskeletal: Positive for arthritis in her right knee; status post left knee replacement Hematologic/Oncologic: Negative; no easy bruising, bleeding Endocrine: History of partial thyroidectomy Neuro: Negative; no changes in balance, headaches Skin: Negative; No rashes or skin lesions Psychiatric: Prior history of depression, currently stable Sleep: Negative; No daytime sleepiness, hypersomnolence, bruxism, restless legs, hypnogagnic hallucinations Other comprehensive 14 point system review is negative   Physical Exam BP 138/60    Pulse 74    Ht $R'5\' 2"'qX$  (1.575 m)    Wt 130 lb (59 kg)    LMP 07/27/1998 (LMP Unknown)    SpO2 99%    BMI 23.78 kg/m    Repeat blood pressure by me was 124/60 supine and 120/60 standing  Wt Readings from Last 3 Encounters:  08/07/21 130 lb (59 kg)  01/21/21 134 lb 3.2 oz (60.9 kg)  09/19/20 148 lb 12.8 oz (67.5 kg)    General:  Alert, oriented, no distress.  Skin: normal turgor, no rashes, warm and dry HEENT: Normocephalic, atraumatic. Pupils equal round and reactive to light; sclera anicteric; extraocular muscles intact;  Nose without nasal septal hypertrophy Mouth/Parynx benign; Mallinpatti scale 3 Neck: No JVD, no carotid bruits; normal carotid upstroke Lungs: clear to ausculatation and percussion; no wheezing or rales Chest wall: without tenderness to palpitation Heart: PMI not displaced, RRR, s1 s2 normal, 1/6 systolic murmur, no diastolic murmur, no rubs, gallops, thrills, or heaves Abdomen: soft, nontender; no hepatosplenomehaly, BS+; abdominal aorta nontender and not dilated by palpation. Back: no CVA tenderness Pulses 2+ Musculoskeletal: full range of motion, normal strength, no joint deformities Extremities: no clubbing cyanosis or edema, Homan's sign negative  Neurologic: grossly nonfocal; Cranial nerves grossly wnl Psychologic: Normal mood and affect    August 07, 2021 ECG (independently read by me):  Sinus rhythm at 74, [facility]  January 21, 2021 ECG (independently read by me): Sinus Bradycardia at 57, ILBBB  February 2022 ECG (independently read by me): NSR at 60; LBBB; QTc 424 msec, no ectopy  August 2021 ECG (independently read by me): Sinus bradycardia 56 bpm.  Left axis deviation.  Borderline left bundle branch block.  No ectopy.    February 2021 ECG (independently read by me): Sinus bradycardia 55 bpm, left axis deviation, left bundle branch block.   October 2020 ECG (independently read by me): Sinus ryhthm at 70 with PVCs; nonspecific ST changes; QTc 124 msec  July 2020 ECG (independently read by me): Sinus Bradycardia at 56; NSSTT changes  January 2010 ECG (independently read by me): Sinus bradycardia 59 bpm.  Left bundle branch block with repolarization changes.  May 09, 2018 ECG (independently read by me): Sinus bradycardia 55 bpm.  Left bundle branch block with repolarization changes.   No ectopy.  November 08, 2017 ECG (independently read by me): Normal sinus rhythm at 66 bpm with occasional PACs.  Left bundle branch block with mild repolarization  October 2018 ECG (independently read by me): Sinus bradycardia 56 bpm.  Left axis deviation.  Left bundle branch block.  QTc interval 424 ms.  August 2018 ECG (independently read by me): Sinus rhythm at 90 bpm with frequent PVCs with transient bigeminy.  May 2018 ECG (independently read by me): Sinus rhythm at 97 bpm with frequent PVCs.  Left bundle branch block.  PR interval 142.  QTc interval 457 ms.  January 2018 ECG (independently read by me): Sinus rhythm at 64 bpm.  Occasional isolated PVC followed by compensatory pause.  QTc interval 44 ms.  PR interval 128 ms.  No ST segment changes.  July 2017 ECG (independently read by me): Sinus bradycardia 56 bpm.  Normal intervals.  Previously noted T-wave changes in leads 3 and aVF.  ECG (independently read by me): sinus bradycardia 57.  Normal intervals.  Nondiagnostic T changes in lead 27 Nov 2014 ECG (independently read by me): Sinus bradycardia 52 bpm.  ST changes improved.  QTc interval 381 ms.  07/26/2014 ECG (independently read by me): Normal sinus rhythm at 68 bpm.  There is mild downsloping ST segment depression in leads II, III, and F V4 through V6, which is slightly more progressive since the patient's prior ECG done in [place].  LABS: Laboratory from 11/11/2015 including a lipid panel and CBC were reviewed.  BMP Latest Ref Rng & Units 09/19/2020 04/27/2019 08/26/2018  Glucose 65 - 99 mg/dL 131(H) 125(H) 114(H)  BUN 8 - 27 mg/dL _0 Creatinine 0.57 - 1.00 mg/dL 1.42(H) 1.35(H) 1.27(H)  BUN/Creat Ratio 12 - _1 Sodium 134 - 144 mmol/L 141 142 139  Potassium 3.5 - 5.2 mmol/L 4.4 5.1 4.5  Chloride 96 - 106 mmol/L 101 105 101  CO2 20 - 29 mmol/L _2 Calcium 8.7 - 10.3 mg/dL 10.5(H) 9.3 9.4   Hepatic Function Latest Ref Rng & Units 09/19/2020  04/27/2019 09/18/2016  Total Protein 6.0 - 8.5 g/dL 6.5 6.8 6.5  Albumin 3.7 - 4.7 g/dL 4.4 4.1 3.9  AST 0 - 40 IU/L _3 ALT 0 - 32 IU/L _4 Alk Phosphatase 44 - 121 IU/L 91 97 59  Total Bilirubin 0.0 - 1.2 mg/dL 0.3 0.2 0.6  Bilirubin, Direct 0.0 - 0.3 mg/dL - - -   CBC Latest Ref Rng & Units 09/19/2020 04/27/2019 09/30/2016  WBC 3.4 - 10.8 x10E3/uL  6.5 10.4 14.1(H)  Hemoglobin 11.1 - 15.9 g/dL 13.2 13.0 10.6(L)  Hematocrit 34.0 - 46.6 % 39.5 39.6 32.4(L)  Platelets 150 - 450 x10E3/uL 278 260 170   Lab Results  Component Value Date   MCV 91 09/19/2020   MCV 88 04/27/2019   MCV 89.3 09/30/2016   Lab Results  Component Value Date   TSH 1.010 09/19/2020  No results found for: HGBA1C  Lipid Panel     Component Value Date/Time   CHOL 124 04/27/2019 0814   TRIG 84 04/27/2019 0814   HDL 47 04/27/2019 0814   CHOLHDL 2.6 04/27/2019 0814   CHOLHDL 3.9 02/14/2015 0400   VLDL 22 02/14/2015 0400   LDLCALC 61 04/27/2019 0814   I personally reviewed the blood work done at [facility] clinic from 11/02/2017: Potassium 4.6.  Creatinine 1.27.  Normal LFTs.  Cholesterol 144, triglycerides 90, HDL 43, LDL 82.  TSH 0.96.   RADIOLOGY: No results found.  IMPRESSION:  No diagnosis found.   ASSESSMENT AND PLAN:  Ms.Guzzetta is a 71 year-old female who has a long-standing history of hypertension, which remotely improved with the addition of amlodipine 5 mg added to her Ziac and lisinopril therapy.  In 2015 she had experienced episodic chest tightness. Cardiac catheterization performed after a nuclear perfusion study raised the possibility of anteroseptal defect demonstrated smooth 40-50% LAD stenosis which did not completely normalize following intracoronary nitroglycerin, and otherwise normal coronary arteries.  She has remained stable without recurrent episodes of chest pain.  Lipid studies were still elevated in 2017 and  atorvastatin was increased to 40 mg with improved results.  She was hospitalized in [place] and was told of having a stroke which was presumed to be of cerebellar origin based on her description of significant symptoms.  She had an MRI, CT, carotid Doppler studies, and echo Doppler study showed normal ejection fraction at 55-60%.  There was no obvious evidence of a PFO on color Doppler or bubble study. A 2 week event monitor in June 2018 showed sinus rhythm with occasional PACs.  There were episodes of sinus tachycardia.  There was one episode of PSVT with heart rates of 144 bpm.  Of note, there was also an episode of sinus tachycardia at 5:30 AM while sleeping.  In the past due to fatigability on metoprolol she was switched to Bystolic.  At subsequent office visits her medications have been adjusted and most recently she is now on carvedilol 18.75 mg in the morning and 25 mg at night, diltiazem CD2 140 mg daily, lisinopril 40 mg in addition to hydralazine 25 mg twice daily.  She has been taking HCTZ on a as needed basis for leg swelling.  She tolerated left knee replacement surgery well.  She did experience swelling postoperatively in her left leg for which she had taken the hydrochlorothiazide.  When I last saw her, her blood pressure was elevated and I recommended slight titration of her hydralazine.  Presently she now feels that she experiences some mild lightheadedness and does not feel quite as well in the morning after she takes her blood pressure medications with blood pressure dropping into the mid 90s to low 100s.  It takes approximately 3 hours later for her to feel better.  She is no longer taking her lisinopril and is taking carvedilol 18.75 mg twice a day, diltiazem 240 mg, and HCTZ 12.5 mg on an as-needed basis.  With her recent symptoms I have suggested she reduce her hydralazine back down to  25 mg twice a day and have recommended she take her diltiazem 2040 mg at bedtime.  She is on Zanaflex for muscle spasms.  She continues to be on atorvastatin for  hyperlipidemia and Dr. Ginette Otto will be rechecking laboratory.  I have recommended she undergo a comprehensive metabolic panel, magnesium level, CBC TSH and fasting lipid studies.  She continues to have some issues with sciatica.  I will see her in 6 months for reevaluation or sooner as needed.   Troy Sine, MD, [facility] 08/07/2021 12:24 PM

## 2021-08-07 NOTE — Patient Instructions (Signed)
Medication Instructions:  DECREASE HYDRALAZINE 25MG  TWICE DAILY  *If you need a refill on your cardiac medications before your next appointment, please call your pharmacy*  Lab Work:    FASTING LIPID, CMET,TSH, MAG AND CBC  WITH PCP   Special Instructions HAVE  Follow-Up: Your next appointment:  6 month(s) In Person with , MD   Please call our office 2 months in advance to schedule this appointment   At [facility], you and your health needs are our priority.  As part of our continuing mission to provide you with exceptional heart care, we have created designated Provider Care Teams.  These Care Teams include your primary Cardiologist (physician) and Advanced Practice Providers (APPs -  Physician Assistants and Nurse Practitioners) who all work together to provide you with the care you need, when you need it.

## 2021-08-08 ENCOUNTER — Encounter: Payer: Self-pay | Admitting: Cardiovascular Disease

## 2021-08-11 MED ORDER — HYDRALAZINE HCL 25 MG PO TABS: 25.0000 mg | ORAL_TABLET | Freq: Two times a day (BID) | ORAL | 7 refills | Status: DC

## 2021-08-23 ENCOUNTER — Encounter: Payer: Self-pay | Admitting: Cardiovascular Disease

## 2021-09-29 ENCOUNTER — Other Ambulatory Visit: Payer: Self-pay | Admitting: Cardiovascular Disease

## 2021-10-26 ENCOUNTER — Other Ambulatory Visit: Payer: Self-pay | Admitting: Cardiovascular Disease

## 2021-12-01 ENCOUNTER — Other Ambulatory Visit: Payer: Self-pay

## 2021-12-01 MED ORDER — HYDRALAZINE HCL 25 MG PO TABS: 25.0000 mg | ORAL_TABLET | Freq: Two times a day (BID) | ORAL | 7 refills | Status: DC

## 2021-12-24 ENCOUNTER — Encounter: Payer: Self-pay | Admitting: Cardiovascular Disease

## 2021-12-24 MED ORDER — HYDRALAZINE HCL 25 MG PO TABS: 25.0000 mg | ORAL_TABLET | Freq: Two times a day (BID) | ORAL | 7 refills | Status: DC

## 2022-02-18 ENCOUNTER — Encounter: Payer: Self-pay | Admitting: Cardiovascular Disease

## 2022-02-18 ENCOUNTER — Ambulatory Visit (INDEPENDENT_AMBULATORY_CARE_PROVIDER_SITE_OTHER): Payer: Medicare Other | Admitting: Cardiovascular Disease

## 2022-02-18 VITALS — BP 148/52 | HR 70 | Ht 62.0 in | Wt 120.0 lb

## 2022-02-18 DIAGNOSIS — I447 Left bundle-branch block, unspecified: Secondary | ICD-10-CM

## 2022-02-18 DIAGNOSIS — M25471 Effusion, right ankle: Secondary | ICD-10-CM

## 2022-02-18 DIAGNOSIS — R0989 Other specified symptoms and signs involving the circulatory and respiratory systems: Secondary | ICD-10-CM | POA: Diagnosis not present

## 2022-02-18 DIAGNOSIS — I2510 Atherosclerotic heart disease of native coronary artery without angina pectoris: Secondary | ICD-10-CM | POA: Diagnosis not present

## 2022-02-18 DIAGNOSIS — E785 Hyperlipidemia, unspecified: Secondary | ICD-10-CM | POA: Diagnosis not present

## 2022-02-18 DIAGNOSIS — I10 Essential (primary) hypertension: Secondary | ICD-10-CM

## 2022-02-18 DIAGNOSIS — M25472 Effusion, left ankle: Secondary | ICD-10-CM

## 2022-02-18 MED ORDER — HYDROCHLOROTHIAZIDE 12.5 MG PO CAPS: 12.5000 mg | ORAL_CAPSULE | ORAL | 3 refills | Status: DC

## 2022-02-18 NOTE — Patient Instructions (Signed)
Medication Instructions:  START HCTZ 12.5 mg every other day   *If you need a refill on your cardiac medications before your next appointment, please call your pharmacy*   Testing/Procedures: US ABDOMEN AORTA    Follow-Up: At [facility], you and your health needs are our priority.  As part of our continuing mission to provide you with exceptional heart care, we have created designated Provider Care Teams.  These Care Teams include your primary Cardiologist (physician) and Advanced Practice Providers (APPs -  Physician Assistants and Nurse Practitioners) who all work together to provide you with the care you need, when you need it.  We recommend signing up for the patient portal called "MyChart".  Sign up information is provided on this After Visit Summary.  MyChart is used to connect with patients for Virtual Visits (Telemedicine).  Patients are able to view lab/test results, encounter notes, upcoming appointments, etc.  Non-urgent messages can be sent to your provider as well.   To learn more about what you can do with MyChart, go to [email or web address].    Your next appointment:   6 month(s)  The format for your next appointment:   In Person  Provider:   Nicki Guadalajara, MD

## 2022-02-18 NOTE — Progress Notes (Signed)
Patient ID: [name], female   DOB: [birth year], 71 y.o.   MRN: [id number]    HPI:  [name] is a 71 y.o. female who is followed by Dr. Ginette Otto in [place], [place].  Her uncle, [name], is one of my patients.  She presents to the office today for a 6 month follow-up cardiology evaluation.   [name] has a history of hypertension dating back for at least 2002, and remotely was maintained on lisinopril 10 mg and Ziac 5/6.25 mg daily.  She has experienced episodic chest pressure intermittently and had initially undergone a stress test a  7 years ago.  She also has a history of depression. 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 She had noticed increasing palpitations and also  developed episodes of chest tightness last year. She underwent a nuclear perfusion study in July 2015 She was felt to have a small fixed anteroseptal defect without ischemia.  Ejection fraction was 39%, but was felt that this may not be accurate due to many PVCs during the study.  She subsequently underwent a 2-D echo Doppler study in October 2015 in [place] , which demonstrated an ejection fraction at 55-60%.  There was mild left ventricular posterior wall thickness.  Check equivocal tissue Doppler assessment.  The peak pressure gradient across her aortic valve was 7.6 mm with a mean pressure gradient of 4.3 mm without evidence for significant aortic stenosis.  The patient states that recently she has noticed more chest tightness and this seems to occur with only walking halfway down her driveway.    When I initially saw her in December 2015,  I added amlodipine 5 mg to help both with blood pressure control as well as potential anti-ischemic benefit.   Due to recurrent symptoms, she underwent a catheterization on 08/03/2014 which revealed smooth 40-50% proximal LAD stenosis after the takeoff of the first diagonal vessel which did not significant normalize following intracoronary  nitroglycerin administration.  Otherwise her coronary anatomy was normal.  She had normal LV function without wall motion abnormalities and evidence for very mild  mitral valve prolapse without significant mitral regurgitation.  She was started on statin therapy with her LDL of 113 attempt to induce plaque stability and regression.  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  She denied any recurrent episodes of chest pain or tightness.  She denies presyncope or syncope.  She was told of having mild osteopenia on bone density imaging.  She underwent a CBC, and lipid study by her primary physician.  Her total cholesterol was 142, HDL 43, LDL 83, and triglycerides 76.  Hemoglobin and hematocrit were 13.2 and 39.1.  She denies any palpitations.She denies any recurrent chest pain.  She does note less shortness of breath.  She is on amlodipine 5 mg Ziac 5/6.25 mg, lisinopril 10 mg daily.  She is now on an increased atorvastatin dose at 40 mg daily.  Lab work from one year ago on 11/08/2014 showed a total cholesterol 236, LDL 163, triglycerides 124.  Her lipid studies have improved under increased atorvastatin dose.    When I saw her in January 2018 she had noticed an occasional pause when she takes her pulse.  She denies any sensation of tachycardia.  She denies presyncope.  She denies syncope.  There are no episodes of chest pain.  She was  need for right knee surgery to be done by Dr. Delfino Lovett.  She underwent her  knee surgery vessel by Dr. Delfino Lovett on 09/28/2016.  She had developed some swelling initially post procedure.  She was doing well but on 12/11/2016, she developed right arm, hand weakness, nausea and vomiting.  She was hospitalized in [place] and was diagnosed as having a stroke.  Apparently, during that evaluation, she underwent CT imaging, MRI, carotid duplex imaging and appears that she may have had an echo bubble study.Since her discharge, she has noticed  significant improvement in her neurologic capacity.  According to her description, it sounds as though she was significant dysarthric and may have had a cerebellar infarct in the etiology of her presentation.  She was discharged on baby aspirin 81 mg in addition to her lisinopril 5 mg, Toprol XL 25 mg, atorvastatin 40 mg, amlodipine 5 mg.  when I saw her in follow-up at her last office visit, she denied any episodes of chest pain, was unaware of any atrial fibrillation and had noticed episodes of heart rates in the 90s to 100s.  I was able to obtain records from the patient's hospitalization in [place].  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  On 12/13/2016 an echo Doppler study showed an EF of 55-60% with normal right ventricular size with normal function.  She had mild left atrial dilation, mild MR, aortic sclerosis without stenosis, and mild TR.  She wore an event monitor in June 2018 which showed sinus rhythm.  She had episodes of sinus tachycardia with PACs, sinus rhythm with PACs, and was an episode of sinus rhythm with PSVT and bigeminal PACs.  Her heart rate had increased to 144 bpm.  During SVT episode.  She also was noted to have sinus tachycardia while sleeping at 5:30 AM for which she was unaware.  When I saw her in follow-up, she had not felt well on the metoprolol and I weaned and ultimately discontinued this and in its place started Bystolic at 5 mg with potential titration to 10 mg as tolerated.  I also discontinued her amlodipine and switch to Cardizem CD 180 mg, both for blood pressure control as well as in attempt to improve rate control.  With this medication adjustment, she has felt significantly better.    When I saw her in October 2018, her ECG was stable with a ventricular rate at 56 bpm.  Her blood pressure was elevated.  She had felt improved on Bystolic 5 mg in addition to Cardizem CD 180 mg.  I  recommended further titration of lisinopril.    I saw her in April 2019 and at that time she felt well with reference to palpitations.  Her ECG showed occasional PACs.    I saw her in October 2019 and had noted the development of  right lower extremity edema which ultimately resolved when she wore compressions stockings.  She been her primary doctor who is scheduled her for Doppler studies but apparently this had never been done and her edema resolved.  She denies any chest pain or episodes of presyncope or syncope.   When evaluated in January 2020 she had some issues with urinary incontinence.  She had been given a prescription for Myrbetriq by he physician but she never filled this due to conmcerns about side effects.  She continued to experience right lower extremity swelling occasionally.  Her blood pressure was elevated and I recommended further titration of lisinopril to 30 mg.  I also recommended HCTZ to take  on an as-needed basis in light of her leg swelling.  Subsequent laboratory showed a BUN of 15 creatinine 1.27 on August 26, 2018.  I saw her in July 2020 at which time she was feeling well and denied chest pain, PND or orthopnea.   She brought with her her blood pressure log.  Her blood pressure has been labile and at times has been in the 160s and other times in the 110s to 120s.  She denies any significant leg swelling.  She has not taken the HCTZ.  She can no longer afford Bystolic and wanted to switch and according to her insurance her options are metoprolol tartrate or carvedilol.  In the past she did not tolerate metoprolol due to fatigability.  During that evaluation, I recommended changing Bystolic to carvedilol 32.3 mg twice a day and further titrated lisinopril to 40 mg daily due to her hypertension.  She states at home typically her blood pressure is running in the mid 557D systolically.  She was evaluated by me on May 15, 2019 at which time she continued to be hypertensive.   Resting pulse was in the 70s and her ECG confirmed sinus rhythm.  I recommended slight titration of carvedilol to 18.75 mg in the morning and 12.5 mg at night.  With her history of prior SVT and ongoing hypertension I recommended she change her Cardizem CD to 240 mg at bedtime and continue the lisinopril 40 mg.  She developed bradycardia dose reduction of carvedilol Sarah be necessary.  When I saw her in February 2021 her blood pressure at home has been stable typically running in the 220U to 542 systolically.  She was unaware of any burst of SVT or palpitations.  She has had issues with arthritis of her low back and recently received Depo-Medrol injection by Dr. Ginette Otto.  She denies any anginal symptoms.  She denies PND orthopnea.  There is no exertional dyspnea.   I saw her in August 2021 and prior to that evaluation she had issues with spinal stenosis of her lower lumbar vertebrae.  She has undergone 2 injections of Depo-Medrol and recently was started on meloxicam.  She was to see Dr. Elpidio Galea next week for follow-up laboratory.  She has been monitoring her blood pressure at home and most of the time blood pressure is very stable less than 706 systolic.  There have been couple instances where it had gotten into the 140s.  She has been on lisinopril 40 mg daily, diltiazem 240 mg, carvedilol 18.75 mg in the morning and 12.5 mg at night and has a prescription for HCTZ.  She has been averaging 1-2 times per month 12.5 mg depending upon swelling or blood pressure.  She continues to be on atorvastatin 40 mg.  She is unaware of any chest pain.  She is unaware of any episodes of heart rate irregularity or tachycardia.  She has not been as active as she had in the past due to her arthritic conditions.  Since she had not been taking her hydrochlorothiazide and her blood pressure was mildly elevated and I suggested that she take HCTZ if her blood pressure was greater than 135.  I last saw her in February 2022.  She was no  longer taking meloxicam and has been taking Tylenol for some arthritic issues.  She has been taking hydrochlorothiazide as a as needed basis.  She was unaware of any palpitations or rhythm disturbance.  She denied presyncope or syncope.  During that evaluation, she was  on carvedilol 18.75 mg in the morning and 12.5 mg at night, diltiazem 240 mg daily, lisinopril 40 mg and was taking HCTZ on an as-needed basis.  Her blood pressure was elevated and I added hydralazine 25 mg twice a day to her medical regimen.  She continued to be on atorvastatin 40 mg daily.  I saw her in January 21, 2021.  She had undergone successful left knee replacement surgery by Dr. Lebron Quam approximately 7 weeks ago.  She tolerated surgery well.  She denies any chest pain or shortness of breath.  She initially had left leg swelling following her surgery and this has improved.  She has continued to be on carvedilol 18.7 5 in the morning and 12.5 mg in the evening, diltiazem CD2 140 mg daily, lisinopril 40 mg, hydralazine 25 mg twice a day.  She is on atorvastatin for hyperlipidemia.  Lab work 2 months ago showed hemoglobin A1c at 6.3.  She has aggressively been watching her diet.  During that evaluation her blood pressure was mildly elevated and I recommended slight titration of hydralazine to 50 mg twice a day with target blood pressure less than 130/80.  I last saw her on August 07, 2021.  At that time she felt well but at times after taking her morning medicine she would notice her blood pressure dropping into the upper 90s to low 100s and several hours later she felt improved.  She is to undergo laboratory with Dr. Ginette Otto.  She continues to have some issues with sciatica.  She denies chest pain.  She is unaware of palpitations.  At that time she was no longer taking lisinopril and was taking carvedilol 18.75 mg twice a day, diltiazem 240 mg daily and HCTZ 12.5 mg on an as-needed basis.  With her symptomatology I recommended she reduce her  hydralazine back down to 25 mg twice a day I recommended she take her diltiazem 240 mg .  Since I last saw her, she has felt well.  She denies any chest pain.  She admits to ankle swelling which is mild which seems to occur on a daily basis.  She is now on carvedilol 18.75 mg in the morning and 12.5 mg at night, diltiazem 240 mg at night, hydralazine 25 mg twice a day, and rarely has taken HCTZ.  She continues to be on atorvastatin 40 mg for hyperlipidemia.  She is on Neurontin for neuropathy.  She continues to be on Zanaflex as needed for muscle spasms.  She presents for evaluation.  Past medical history is notable for hypertension, palpitations, depressive disorder, osteoarthritis.  Past surgical history is notable for partial thyroidectomy while she was in eighth grade, and recent right knee surgery  Current Outpatient Medications  Medication Sig Dispense Refill   ARTIFICIAL TEAR OP Apply 1 drop to eye daily as needed (dry eyes).     aspirin EC 81 MG tablet Take 1 tablet (81 mg total) by mouth daily. 90 tablet 3   atorvastatin (LIPITOR) 40 MG tablet TAKE 1 TABLET(40 MG) BY MOUTH DAILY 90 tablet 3   Biotin 1 MG CAPS Take by mouth.     calcium carbonate (OS-CAL) 600 MG TABS tablet Take by mouth.     CALCIUM PO Take 1,200 mg by mouth 2 (two) times daily.     carvedilol (COREG) 12.5 MG tablet TAKE 1 1/2 TABLETS EVERY MORNING AND 1 TABLET EVERY EVENING 225 tablet 1   Cholecalciferol 125 MCG (5000 UT) capsule Take by mouth.  Coenzyme Q10 (COQ-10) 200 MG CAPS Take 200 mg by mouth daily.     diltiazem (CARDIZEM CD) 240 MG 24 hr capsule TAKE 1 CAPSULE(240 MG) BY MOUTH DAILY 90 capsule 3   gabapentin (NEURONTIN) 300 MG capsule Take 300 mg by mouth at bedtime.     hydrALAZINE (APRESOLINE) 25 MG tablet Take 1 tablet (25 mg total) by mouth in the morning and at bedtime. 60 tablet 7   nitroGLYCERIN (NITROSTAT) 0.4 MG SL tablet Place 1 tablet (0.4 mg total) under the tongue every 5 (five) minutes as  needed for chest pain. 25 tablet 3   NON FORMULARY Take by mouth.     Omega-3 Fatty Acids (FISH OIL ULTRA) 1400 MG CAPS Take 1,400 mg by mouth 2 (two) times daily.     oxyCODONE-acetaminophen (PERCOCET/ROXICET) 5-325 MG tablet Take by mouth.     tiZANidine (ZANAFLEX) 4 MG tablet Take 4 mg by mouth every 6 (six) hours as needed.     traMADol (ULTRAM) 50 MG tablet Take 50 mg by mouth every 6 (six) hours as needed.     hydrochlorothiazide (MICROZIDE) 12.5 MG capsule Take 1 capsule (12.5 mg total) by mouth every other day. 45 capsule 3   No current facility-administered medications for this visit.   Social history is notable in that she is married for >40 years.  She does not have any children.  She lives with her husband.  She completed 12th grade of education.  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  There is no tobacco history or alcohol use.  She does care for many dogs.  Family History  Problem Relation Age of Onset   Heart failure Mother    Sudden death Father    COPD Father    Cancer Brother    Cancer Sister    Family history is notable that her mother died at age 49 with possible congestive heart failure.  Her father died at 3 with sudden death.  He had COPD.  A brother died at 34 with cancer and a sister died at 65 with cancer.   ROS General: Negative; No fevers, chills, or night sweats HEENT: Negative; No changes in vision or hearing, sinus congestion, difficulty swallowing Pulmonary: Negative; No cough, wheezing, shortness of breath, hemoptysis Cardiovascular:  See HPI;  GI: Negative; No nausea, vomiting, diarrhea, or abdominal pain GU: Occasional urinary incontinence Musculoskeletal: Positive for arthritis in her right knee; status post left knee replacement Hematologic/Oncologic: Negative; no easy bruising, bleeding Endocrine: History of partial thyroidectomy Neuro: Negative; no changes in balance, headaches Skin: Negative; No  rashes or skin lesions Psychiatric: Prior history of depression, currently stable Sleep: Negative; No daytime sleepiness, hypersomnolence, bruxism, restless legs, hypnogagnic hallucinations Other comprehensive 14 point system review is negative   Physical Exam BP (!) 148/52 (BP Location: Left Arm, Patient Position: Sitting, Cuff Size: Normal)   Pulse 70   Ht _0  (1.575 m)   Wt 120 lb (54.4 kg)   LMP 07/27/1998 (LMP Unknown)   BMI 21.95 kg/m    Repeat blood pressure by me was 146/64  Wt Readings from Last 3 Encounters:  02/18/22 120 lb (54.4 kg)  08/07/21 130 lb (59 kg)  01/21/21 134 lb 3.2 oz (60.9 kg)   General: Alert, oriented, no distress.  Skin: normal turgor, no rashes, warm and dry HEENT: Normocephalic, atraumatic. Pupils equal round and reactive to light; sclera anicteric; extraocular muscles intact; Fundi ** Nose without nasal septal  hypertrophy Mouth/Parynx benign; Mallinpatti scale 3 Neck: No JVD, no carotid bruits; normal carotid upstroke Lungs: clear to ausculatation and percussion; no wheezing or rales Chest wall: without tenderness to palpitation Heart: PMI not displaced, RRR, s1 s2 normal, 1/6 systolic murmur, no diastolic murmur, no rubs, gallops, thrills, or heaves Abdomen: soft, nontender; no hepatosplenomehaly, BS+; abdominal aorta nontender and not dilated by palpation. Back: no CVA tenderness Pulses 2+ Musculoskeletal: full range of motion, normal strength, no joint deformities Extremities: no clubbing cyanosis or edema, Homan's sign negative  Neurologic: grossly nonfocal; Cranial nerves grossly wnl Psychologic: Normal mood and affect   February 18, 2022 ECG (independently read by me): NSR at 70, LBBB   August 07, 2021 ECG (independently read by me):  Sinus rhythm at 74, [facility]  January 21, 2021 ECG (independently read by me): Sinus Bradycardia at 57, ILBBB  February 2022 ECG (independently read by me): NSR at 60; LBBB; QTc 424 msec, no ectopy  August  2021 ECG (independently read by me): Sinus bradycardia 56 bpm.  Left axis deviation.  Borderline left bundle branch block.  No ectopy.    February 2021 ECG (independently read by me): Sinus bradycardia 55 bpm, left axis deviation, left bundle branch block.   October 2020 ECG (independently read by me): Sinus ryhthm at 70 with PVCs; nonspecific ST changes; QTc 124 msec  July 2020 ECG (independently read by me): Sinus Bradycardia at 56; NSSTT changes  January 2010 ECG (independently read by me): Sinus bradycardia 59 bpm.  Left bundle branch block with repolarization changes.  May 09, 2018 ECG (independently read by me): Sinus bradycardia 55 bpm.  Left bundle branch block with repolarization changes.  No ectopy.  November 08, 2017 ECG (independently read by me): Normal sinus rhythm at 66 bpm with occasional PACs.  Left bundle branch block with mild repolarization  October 2018 ECG (independently read by me): Sinus bradycardia 56 bpm.  Left axis deviation.  Left bundle branch block.  QTc interval 424 ms.  August 2018 ECG (independently read by me): Sinus rhythm at 90 bpm with frequent PVCs with transient bigeminy.  May 2018 ECG (independently read by me): Sinus rhythm at 97 bpm with frequent PVCs.  Left bundle branch block.  PR interval 142.  QTc interval 457 ms.  January 2018 ECG (independently read by me): Sinus rhythm at 64 bpm.  Occasional isolated PVC followed by compensatory pause.  QTc interval 44 ms.  PR interval 128 ms.  No ST segment changes.  July 2017 ECG (independently read by me): Sinus bradycardia 56 bpm.  Normal intervals.  Previously noted T-wave changes in leads 3 and aVF.  ECG (independently read by me): sinus bradycardia 57.  Normal intervals.  Nondiagnostic T changes in lead 27 Nov 2014 ECG (independently read by me): Sinus bradycardia 52 bpm.  ST changes improved.  QTc interval 381 ms.  07/26/2014 ECG (independently read by me): Normal sinus rhythm at 68 bpm.  There is  mild downsloping ST segment depression in leads II, III, and F V4 through V6, which is slightly more progressive since the patient's prior ECG done in [place].  LABS: Laboratory from 11/11/2015 including a lipid panel and CBC were reviewed.     Latest Ref Rng & Units 09/19/2020   12:56 PM 04/27/2019    8:14 AM 08/26/2018    9:54 AM  BMP  Glucose 65 - 99 mg/dL 131  125  114   BUN 8 - 27 mg/dL 20  17  15  Creatinine 0.57 - 1.00 mg/dL 1.42  1.35  1.27   BUN/Creat Ratio 12 - _0 Sodium 134 - 144 mmol/L 141  142  139   Potassium 3.5 - 5.2 mmol/L 4.4  5.1  4.5   Chloride 96 - 106 mmol/L 101  105  101   CO2 20 - 29 mmol/L _1 Calcium 8.7 - 10.3 mg/dL 10.5  9.3  9.4       Latest Ref Rng & Units 09/19/2020   12:56 PM 04/27/2019    8:14 AM 09/18/2016    1:46 PM  Hepatic Function  Total Protein 6.0 - 8.5 g/dL 6.5  6.8  6.5   Albumin 3.7 - 4.7 g/dL 4.4  4.1  3.9   AST 0 - 40 IU/L _2 ALT 0 - 32 IU/L _3 Alk Phosphatase 44 - 121 IU/L 91  97  59   Total Bilirubin 0.0 - 1.2 mg/dL 0.3  0.2  0.6       Latest Ref Rng & Units 09/19/2020   12:56 PM 04/27/2019    8:14 AM 09/30/2016    7:12 AM  CBC  WBC 3.4 - 10.8 x10E3/uL 6.5  10.4  14.1   Hemoglobin 11.1 - 15.9 g/dL 13.2  13.0  10.6   Hematocrit 34.0 - 46.6 % 39.5  39.6  32.4   Platelets 150 - 450 x10E3/uL 278  260  170    Lab Results  Component Value Date   MCV 91 09/19/2020   MCV 88 04/27/2019   MCV 89.3 09/30/2016   Lab Results  Component Value Date   TSH 1.010 09/19/2020  No results found for: "HGBA1C"  Lipid Panel     Component Value Date/Time   CHOL 124 04/27/2019 0814   TRIG 84 04/27/2019 0814   HDL 47 04/27/2019 0814   CHOLHDL 2.6 04/27/2019 0814   CHOLHDL 3.9 02/14/2015 0400   VLDL 22 02/14/2015 0400   LDLCALC 61 04/27/2019 0814   I personally reviewed the blood work done at [facility] clinic from 11/02/2017: Potassium 4.6.  Creatinine 1.27.  Normal LFTs.  Cholesterol 144,  triglycerides 90, HDL 43, LDL 82.  TSH 0.96.   RADIOLOGY: No results found.  IMPRESSION:  1. Essential hypertension   2. Abdominal bruit   3. CAD in native artery   4. Hyperlipidemia LDL goal <70   5. Left bundle branch block   6. Ankle edema, bilateral     ASSESSMENT AND PLAN:  Ms.Senske is a 71 year-old female who has a long-standing history of hypertension, which remotely improved with the addition of amlodipine 5 mg added to her Ziac and lisinopril therapy.  In 2015 she had experienced episodic chest tightness. Cardiac catheterization performed after a nuclear perfusion study raised the possibility of anteroseptal defect demonstrated smooth 40-50% LAD stenosis which did not completely normalize following intracoronary nitroglycerin, and otherwise normal coronary arteries.  She has remained stable without recurrent episodes of chest pain.  Lipid studies were still elevated in 2017 and  atorvastatin was increased to 40 mg with improved results. She was hospitalized in [place] and was told of having a stroke which was presumed to be of cerebellar origin based on her description of significant symptoms.  She had an MRI, CT, carotid Doppler studies, and echo Doppler study showed normal ejection fraction at 55-60%.  There was no obvious evidence  of a PFO on color Doppler or bubble study. A 2 week event monitor in June 2018 showed sinus rhythm with occasional PACs.  There were episodes of sinus tachycardia.  There was one episode of PSVT with heart rates of 144 bpm.  Of note, there was also an episode of sinus tachycardia at 5:30 AM while sleeping.  In the past due to fatigability on metoprolol she was switched to Bystolic.  At subsequent office visits her medications have been adjusted and following her last evaluation, she is now been on a regimen of carvedilol 18.75 mg in the morning and 12.5 mg at night, hydralazine 25 mg twice daily, diltiazem 240 mg at bedtime, and HCTZ which she has  available and takes as needed for swelling.  With her medication adjustment at her last office visit, she no longer is experiencing headedness after she takes her morning medications.  However, she has noticed ankle swelling bilaterally.  I have suggested that she take HCTZ 12.5 mg initially every other day and depending upon blood pressure and edema adjustments can be made to either daily or every third or fourth day.  Continues to be on atorvastatin for hyperlipidemia.  On exam, she did have aortic bruit over her abdominal aorta.  With her age of 21 years, I have recommended she undergo an abdominal aortic ultrasound for further evaluation and screening for AAA.  We Sarah contact her regarding the results.  Her ECG today remained stable and shows sinus rhythm at a rate in the 70s with left bundle branch block and repolarization changes.  Dr. Ginette Otto has been checking laboratory.  Target LDL is less than 70 with her mild CAD.  I Sarah see her in 6 months for reevaluation or sooner as needed   Troy Sine, MD, [facility] 02/18/2022 5:27 PM

## 2022-02-19 MED ORDER — HYDROCHLOROTHIAZIDE 12.5 MG PO CAPS: 12.5000 mg | ORAL_CAPSULE | ORAL | 3 refills | Status: DC

## 2022-03-05 ENCOUNTER — Ambulatory Visit (HOSPITAL_COMMUNITY)
Admission: RE | Admit: 2022-03-05 | Discharge: 2022-03-05 | Disposition: A | Discharge status: Home / Self Care | Payer: Medicare Other | Source: Ambulatory Visit | Attending: Cardiology | Admitting: Cardiology

## 2022-03-05 DIAGNOSIS — R0989 Other specified symptoms and signs involving the circulatory and respiratory systems: Secondary | ICD-10-CM | POA: Diagnosis not present

## 2022-03-17 ENCOUNTER — Other Ambulatory Visit: Payer: Self-pay | Admitting: Cardiovascular Disease

## 2022-03-20 ENCOUNTER — Other Ambulatory Visit: Payer: Self-pay | Admitting: Cardiovascular Disease

## 2022-06-17 ENCOUNTER — Other Ambulatory Visit: Payer: Self-pay | Admitting: Cardiovascular Disease

## 2022-08-06 ENCOUNTER — Ambulatory Visit: Payer: Medicare Other | Attending: Cardiovascular Disease | Admitting: Cardiovascular Disease

## 2022-08-06 VITALS — BP 122/46 | HR 75 | Ht 62.0 in | Wt 122.8 lb

## 2022-08-06 DIAGNOSIS — E785 Hyperlipidemia, unspecified: Secondary | ICD-10-CM | POA: Diagnosis not present

## 2022-08-06 DIAGNOSIS — I2510 Atherosclerotic heart disease of native coronary artery without angina pectoris: Secondary | ICD-10-CM | POA: Insufficient documentation

## 2022-08-06 MED ORDER — HYDRALAZINE HCL 25 MG PO TABS: 25.0000 mg | ORAL_TABLET | Freq: Two times a day (BID) | ORAL | 3 refills | Status: DC

## 2022-08-06 MED ORDER — CARVEDILOL 12.5 MG PO TABS: 18.7500 mg | ORAL_TABLET | Freq: Two times a day (BID) | ORAL | 3 refills | Status: DC

## 2022-08-06 NOTE — Progress Notes (Signed)
Patient ID: [name], female   DOB: February 01, 1951, 73 y.o.   MRN: [id number]       HPI:  [name] is a 72 y.o. female who is followed by Dr. Tanda Rockers in [place], [place].  Her uncle, [name], is one of my patients.  She presents to the office today for a 6 month follow-up cardiology evaluation.   [name] has a history of hypertension dating back for at least 2002, and remotely was maintained on lisinopril 10 mg and Ziac 5/6.25 mg daily.  She has experienced episodic chest pressure intermittently and had initially undergone a stress test a  7 years ago.  She also has a history of depression. 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 She had noticed increasing palpitations and also  developed episodes of chest tightness last year. She underwent a nuclear perfusion study in July 2015 She was felt to have a small fixed anteroseptal defect without ischemia.  Ejection fraction was 39%, but was felt that this may not be accurate due to many PVCs during the study.  She subsequently underwent a 2-D echo Doppler study in October 2015 in [place] , which demonstrated an ejection fraction at 55-60%.  There was mild left ventricular posterior wall thickness.  Check equivocal tissue Doppler assessment.  The peak pressure gradient across her aortic valve was 7.6 mm with a mean pressure gradient of 4.3 mm without evidence for significant aortic stenosis.  The patient states that recently she has noticed more chest tightness and this seems to occur with only walking halfway down her driveway.    When I initially saw her in December 2015,  I added amlodipine 5 mg to help both with blood pressure control as well as potential anti-ischemic benefit.   Due to recurrent symptoms, she underwent a catheterization on 08/03/2014 which revealed smooth 40-50% proximal LAD stenosis after the takeoff of the first diagonal vessel which did not significant normalize following intracoronary  nitroglycerin administration.  Otherwise her coronary anatomy was normal.  She had normal LV function without wall motion abnormalities and evidence for very mild  mitral valve prolapse without significant mitral regurgitation.  She was started on statin therapy with her LDL of 113 attempt to induce plaque stability and regression.  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  She denied any recurrent episodes of chest pain or tightness.  She denies presyncope or syncope.  She was told of having mild osteopenia on bone density imaging.  She underwent a CBC, and lipid study by her primary physician.  Her total cholesterol was 142, HDL 43, LDL 83, and triglycerides 76.  Hemoglobin and hematocrit were 13.2 and 39.1.  She denies any palpitations.She denies any recurrent chest pain.  She does note less shortness of breath.  She is on amlodipine 5 mg Ziac 5/6.25 mg, lisinopril 10 mg daily.  She is now on an increased atorvastatin dose at 40 mg daily.  Lab work from one year ago on 11/08/2014 showed a total cholesterol 236, LDL 163, triglycerides 124.  Her lipid studies have improved under increased atorvastatin dose.    When I saw her in January 2018 she had noticed an occasional pause when she takes her pulse.  She denies any sensation of tachycardia.  She denies presyncope.  She denies syncope.  There are no episodes of chest pain.  She was  need for right knee surgery to be done by Dr. Veda Canning.  She underwent her knee surgery vessel by Dr. Delfino Lovett on 09/28/2016.  She had developed some swelling initially post procedure.  She was doing well but on 12/11/2016, she developed right arm, hand weakness, nausea and vomiting.  She was hospitalized in [place] and was diagnosed as having a stroke.  Apparently, during that evaluation, she underwent CT imaging, MRI, carotid duplex imaging and appears that she may have had an echo bubble study.Since her discharge, she has noticed  significant improvement in her neurologic capacity.  According to her description, it sounds as though she was significant dysarthric and may have had a cerebellar infarct in the etiology of her presentation.  She was discharged on baby aspirin 81 mg in addition to her lisinopril 5 mg, Toprol XL 25 mg, atorvastatin 40 mg, amlodipine 5 mg.  when I saw her in follow-up at her last office visit, she denied any episodes of chest pain, was unaware of any atrial fibrillation and had noticed episodes of heart rates in the 90s to 100s.  I was able to obtain records from the patient's hospitalization in [place].  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  On 12/13/2016 an echo Doppler study showed an EF of 55-60% with normal right ventricular size with normal function.  She had mild left atrial dilation, mild MR, aortic sclerosis without stenosis, and mild TR.  She wore an event monitor in June 2018 which showed sinus rhythm.  She had episodes of sinus tachycardia with PACs, sinus rhythm with PACs, and was an episode of sinus rhythm with PSVT and bigeminal PACs.  Her heart rate had increased to 144 bpm.  During SVT episode.  She also was noted to have sinus tachycardia while sleeping at 5:30 AM for which she was unaware.  When I saw her in follow-up, she had not felt well on the metoprolol and I weaned and ultimately discontinued this and in its place started Bystolic at 5 mg with potential titration to 10 mg as tolerated.  I also discontinued her amlodipine and switch to Cardizem CD 180 mg, both for blood pressure control as well as in attempt to improve rate control.  With this medication adjustment, she has felt significantly better.    When I saw her in October 2018, her ECG was stable with a ventricular rate at 56 bpm.  Her blood pressure was elevated.  She had felt improved on Bystolic 5 mg in addition to Cardizem CD 180 mg.  I  recommended further titration of lisinopril.    I saw her in April 2019 and at that time she felt well with reference to palpitations.  Her ECG showed occasional PACs.    I saw her in October 2019 and had noted the development of  right lower extremity edema which ultimately resolved when she wore compressions stockings.  She been her primary doctor who is scheduled her for Doppler studies but apparently this had never been done and her edema resolved.  She denies any chest pain or episodes of presyncope or syncope.   When evaluated in January 2020 she had some issues with urinary incontinence.  She had been given a prescription for Myrbetriq by he physician but she never filled this due to conmcerns about side effects.  She continued to experience right lower extremity swelling occasionally.  Her blood pressure was elevated and I recommended further titration of lisinopril to 30 mg.  I also recommended  HCTZ to take on an as-needed basis in light of her leg swelling.  Subsequent laboratory showed a BUN of 15 creatinine 1.27 on August 26, 2018.  I saw her in July 2020 at which time she was feeling well and denied chest pain, PND or orthopnea.   She brought with her her blood pressure log.  Her blood pressure has been labile and at times has been in the 160s and other times in the 110s to 120s.  She denies any significant leg swelling.  She has not taken the HCTZ.  She can no longer afford Bystolic and wanted to switch and according to her insurance her options are metoprolol tartrate or carvedilol.  In the past she did not tolerate metoprolol due to fatigability.  During that evaluation, I recommended changing Bystolic to carvedilol [id number] mg twice a day and further titrated lisinopril to 40 mg daily due to her hypertension.  She states at home typically her blood pressure is running in the mid [id number] systolically.  She was evaluated by me on May 15, 2019 at which time she continued to be hypertensive.   Resting pulse was in the 70s and her ECG confirmed sinus rhythm.  I recommended slight titration of carvedilol to 18.75 mg in the [facility] and 12.5 mg at night.  With her history of prior SVT and ongoing hypertension I recommended she change her Cardizem CD to 240 mg at bedtime and continue the lisinopril 40 mg.  She developed bradycardia dose reduction of carvedilol will be necessary.  When I saw her in February 2021 her blood pressure at home has been stable typically running in the [id number] to [id number] systolically.  She was unaware of any burst of SVT or palpitations.  She has had issues with arthritis of her low back and recently received Depo-Medrol injection by Dr. Ginette Otto.  She denies any anginal symptoms.  She denies PND orthopnea.  There is no exertional dyspnea.   I saw her in August 2021 and prior to that evaluation she had issues with spinal stenosis of her lower lumbar vertebrae.  She has undergone 2 injections of Depo-Medrol and recently was started on meloxicam.  She was to see Dr. Elpidio Galea next week for follow-up laboratory.  She has been monitoring her blood pressure at home and most of the time blood pressure is very stable less than [id number] systolic.  There have been couple instances where it had gotten into the 140s.  She has been on lisinopril 40 mg daily, diltiazem 240 mg, carvedilol 18.75 mg in the [facility] and 12.5 mg at night and has a prescription for HCTZ.  She has been averaging 1-2 times per month 12.5 mg depending upon swelling or blood pressure.  She continues to be on atorvastatin 40 mg.  She is unaware of any chest pain.  She is unaware of any episodes of heart rate irregularity or tachycardia.  She has not been as active as she had in the past due to her arthritic conditions.  Since she had not been taking her hydrochlorothiazide and her blood pressure was mildly elevated and I suggested that she take HCTZ if her blood pressure was greater than 135.  I saw her in February 2022.  She was no  longer taking meloxicam and has been taking Tylenol for some arthritic issues.  She has been taking hydrochlorothiazide as a as needed basis.  She was unaware of any palpitations or rhythm disturbance.  She denied presyncope or syncope.  During that evaluation,  she was on carvedilol 18.75 mg in the [facility] and 12.5 mg at night, diltiazem 240 mg daily, lisinopril 40 mg and was taking HCTZ on an as-needed basis.  Her blood pressure was elevated and I added hydralazine 25 mg twice a day to her medical regimen.  She continued to be on atorvastatin 40 mg daily.  I saw her in January 21, 2021.  She had undergone successful left knee replacement surgery by Dr. Lebron Quam approximately 7 weeks ago.  She tolerated surgery well.  She denies any chest pain or shortness of breath.  She initially had left leg swelling following her surgery and this has improved.  She has continued to be on carvedilol 18.7 5 in the [facility] and 12.5 mg in the evening, diltiazem CD2 140 mg daily, lisinopril 40 mg, hydralazine 25 mg twice a day.  She is on atorvastatin for hyperlipidemia.  Lab work 2 months ago showed hemoglobin A1c at 6.3.  She has aggressively been watching her diet.  During that evaluation her blood pressure was mildly elevated and I recommended slight titration of hydralazine to 50 mg twice a day with target blood pressure less than 130/80.  I saw her on August 07, 2021.  At that time she felt well but at times after taking her [facility] medicine she would notice her blood pressure dropping into the upper 90s to low 100s and several hours later she felt improved.  She is to undergo laboratory with Dr. Ginette Otto.  She continues to have some issues with sciatica.  She denies chest pain.  She is unaware of palpitations.  At that time she was no longer taking lisinopril and was taking carvedilol 18.75 mg twice a day, diltiazem 240 mg daily and HCTZ 12.5 mg on an as-needed basis.  With her symptomatology I recommended she reduce her  hydralazine back down to 25 mg twice a day I recommended she take her diltiazem 240 mg .  I last saw her on February 18, 2022.  Since her prior evaluation she has felt well and denied any chest pain..  She admits to ankle swelling which is mild which seems to occur on a daily basis.  She is now on carvedilol 18.75 mg in the [facility] and 12.5 mg at night, diltiazem 240 mg at night, hydralazine 25 mg twice a day, and rarely has taken HCTZ.  She continues to be on atorvastatin 40 mg for hyperlipidemia.  She is on Neurontin for neuropathy.  She continues to be on Zanaflex as needed for muscle spasms.  At that time I suggested she take HCTZ 12.5 mg initially every other day and depending upon blood pressure and edema adjustments could be made to her usage.  With her age of 76 years I recommended she undergo abdominal aortic aneurysm ultrasound screening.  On March 05, 2022 her ultrasound of her abdominal aorta was normal with no evidence for abdominal aortic aneurysm.  There was evidence for minimal atherosclerosis in the iliac arteries without significant stenosis.  Presently, she feels well.  She has issues with her cervical spine and disc disease involving C3 and C5.  She has continued to be on atorvastatin 40 mg for hyperlipidemia.  She is on carvedilol 18.75 mg in the [facility] and 12.5 mg at night, diltiazem 240 mg daily, hydralazine 25 mg twice a day, HCTZ 12.5 mg every other day or as needed for swelling.  She sees Dr. Ginette Otto in [place] for primary care.  She presents for reevaluation.   Past medical  history is notable for hypertension, palpitations, depressive disorder, osteoarthritis.  Past surgical history is notable for partial thyroidectomy while she was in eighth grade, and recent right knee surgery  Current Outpatient Medications  Medication Sig Dispense Refill   ARTIFICIAL TEAR OP Apply 1 drop to eye daily as needed (dry eyes).     aspirin EC 81 MG tablet Take 1 tablet (81 mg total) by  mouth daily. 90 tablet 3   atorvastatin (LIPITOR) 40 MG tablet TAKE 1 TABLET(40 MG) BY MOUTH DAILY 90 tablet 3   Biotin 1 MG CAPS Take by mouth.     calcium carbonate (OS-CAL) 600 MG TABS tablet Take by mouth.     CALCIUM PO Take 1,200 mg by mouth 2 (two) times daily.     carvedilol (COREG) 12.5 MG tablet TAKE 1 1/2 TABLETS EVERY [facility] AND 1 TABLET EVERY EVENING 225 tablet 1   Cholecalciferol 125 MCG (5000 UT) capsule Take by mouth.     Coenzyme Q10 (COQ-10) 200 MG CAPS Take 200 mg by mouth daily.     diltiazem (CARDIZEM CD) 240 MG 24 hr capsule TAKE 1 CAPSULE(240 MG) BY MOUTH DAILY 90 capsule 3   gabapentin (NEURONTIN) 300 MG capsule Take 300 mg by mouth at bedtime.     hydrALAZINE (APRESOLINE) 25 MG tablet Take 1 tablet (25 mg total) by mouth in the [facility] and at bedtime. 60 tablet 7   nitroGLYCERIN (NITROSTAT) 0.4 MG SL tablet Place 1 tablet (0.4 mg total) under the tongue every 5 (five) minutes as needed for chest pain. 25 tablet 3   NON FORMULARY Take by mouth.     Omega-3 Fatty Acids (FISH OIL ULTRA) 1400 MG CAPS Take 1,400 mg by mouth 2 (two) times daily.     oxyCODONE-acetaminophen (PERCOCET/ROXICET) 5-325 MG tablet Take by mouth.     tiZANidine (ZANAFLEX) 4 MG tablet Take 4 mg by mouth every 6 (six) hours as needed.     traMADol (ULTRAM) 50 MG tablet Take 50 mg by mouth every 6 (six) hours as needed.     hydrochlorothiazide (MICROZIDE) 12.5 MG capsule Take 1 capsule (12.5 mg total) by mouth every other day. 45 capsule 3   No current facility-administered medications for this visit.   Social history is notable in that she is married for >40 years.  She does not have any children.  She lives with her husband.  She completed 12th grade of education.  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  There is no tobacco history or alcohol use.  She does care for many dogs.  Family History  Problem Relation Age of Onset   Heart failure Mother     Sudden death Father    COPD Father    Cancer Brother    Cancer Sister    Family history is notable that her mother died at age 5 with possible congestive heart failure.  Her father died at 46 with sudden death.  He had COPD.  A brother died at 31 with cancer and a sister died at 59 with cancer.   ROS General: Negative; No fevers, chills, or night sweats HEENT: Negative; No changes in vision or hearing, sinus congestion, difficulty swallowing Pulmonary: Negative; No cough, wheezing, shortness of breath, hemoptysis Cardiovascular:  See HPI;  GI: Negative; No nausea, vomiting, diarrhea, or abdominal pain GU: Occasional urinary incontinence Musculoskeletal: Positive for arthritis in her right knee; status post left knee replacement Hematologic/Oncologic: Negative; no easy  bruising, bleeding Endocrine: History of partial thyroidectomy Neuro: Negative; no changes in balance, headaches Skin: Negative; No rashes or skin lesions Psychiatric: Prior history of depression, currently stable Sleep: Negative; No daytime sleepiness, hypersomnolence, bruxism, restless legs, hypnogagnic hallucinations Other comprehensive 14 point system review is negative   Physical Exam BP (!) 148/52 (BP Location: Left Arm, Patient Position: Sitting, Cuff Size: Normal)   Pulse 70   Ht 5\' 2"  (1.575 m)   Wt 120 lb (54.4 kg)   LMP 07/27/1998 (LMP Unknown)   BMI 21.95 kg/m    Repeat blood pressure by me was 134/66  Wt Readings from Last 3 Encounters:  02/18/22 120 lb (54.4 kg)  08/07/21 130 lb (59 kg)  01/21/21 134 lb 3.2 oz (60.9 kg)   General: Alert, oriented, no distress.  Skin: normal turgor, no rashes, warm and dry HEENT: Normocephalic, atraumatic. Pupils equal round and reactive to light; sclera anicteric; extraocular muscles intact;  Nose without nasal septal hypertrophy Mouth/Parynx benign; Mallinpatti scale 3 Neck: No JVD, no carotid bruits; normal carotid upstroke Lungs: clear to ausculatation  and percussion; no wheezing or rales Chest wall: without tenderness to palpitation Heart: PMI not displaced, RRR, s1 s2 normal, 1/6 systolic murmur, no diastolic murmur, no rubs, gallops, thrills, or heaves Abdomen: Soft abdominal bruit; soft, nontender; no hepatosplenomehaly, BS+; abdominal aorta nontender and not dilated by palpation. Back: no CVA tenderness Pulses 2+ Musculoskeletal: full range of motion, normal strength, no joint deformities Extremities: no clubbing cyanosis or edema, Homan's sign negative  Neurologic: grossly nonfocal; Cranial nerves grossly wnl Psychologic: Normal mood and affect  January 11, 2024ECG (independently read by me): NSR at 47; PVcs  February 18, 2022 ECG (independently read by me): NSR at 70, LBBB   August 07, 2021 ECG (independently read by me):  Sinus rhythm at 74, [facility]  January 21, 2021 ECG (independently read by me): Sinus Bradycardia at 57, ILBBB  February 2022 ECG (independently read by me): NSR at 60; LBBB; QTc 424 msec, no ectopy  August 2021 ECG (independently read by me): Sinus bradycardia 56 bpm.  Left axis deviation.  Borderline left bundle branch block.  No ectopy.    February 2021 ECG (independently read by me): Sinus bradycardia 55 bpm, left axis deviation, left bundle branch block.   October 2020 ECG (independently read by me): Sinus ryhthm at 70 with PVCs; nonspecific ST changes; QTc 124 msec  July 2020 ECG (independently read by me): Sinus Bradycardia at 56; NSSTT changes  January 2010 ECG (independently read by me): Sinus bradycardia 59 bpm.  Left bundle branch block with repolarization changes.  May 09, 2018 ECG (independently read by me): Sinus bradycardia 55 bpm.  Left bundle branch block with repolarization changes.  No ectopy.  November 08, 2017 ECG (independently read by me): Normal sinus rhythm at 66 bpm with occasional PACs.  Left bundle branch block with mild repolarization  October 2018 ECG (independently read by me): Sinus  bradycardia 56 bpm.  Left axis deviation.  Left bundle branch block.  QTc interval 424 ms.  August 2018 ECG (independently read by me): Sinus rhythm at 90 bpm with frequent PVCs with transient bigeminy.  May 2018 ECG (independently read by me): Sinus rhythm at 97 bpm with frequent PVCs.  Left bundle branch block.  PR interval 142.  QTc interval 457 ms.  January 2018 ECG (independently read by me): Sinus rhythm at 64 bpm.  Occasional isolated PVC followed by compensatory pause.  QTc interval 44 ms.  PR  interval 128 ms.  No ST segment changes.  July 2017 ECG (independently read by me): Sinus bradycardia 56 bpm.  Normal intervals.  Previously noted T-wave changes in leads 3 and aVF.  ECG (independently read by me): sinus bradycardia 57.  Normal intervals.  Nondiagnostic T changes in lead 27 Nov 2014 ECG (independently read by me): Sinus bradycardia 52 bpm.  ST changes improved.  QTc interval 381 ms.  07/26/2014 ECG (independently read by me): Normal sinus rhythm at 68 bpm.  There is mild downsloping ST segment depression in leads II, III, and F V4 through V6, which is slightly more progressive since the patient's prior ECG done in [place].  LABS: Laboratory from 11/11/2015 including a lipid panel and CBC were reviewed.     Latest Ref Rng & Units 09/19/2020   12:56 PM 04/27/2019    8:14 AM 08/26/2018    9:54 AM  BMP  Glucose 65 - 99 mg/dL 131  125  114   BUN 8 - 27 mg/dL 20  17  15    Creatinine 0.57 - 1.00 mg/dL 1.42  1.35  1.27   BUN/Creat Ratio 12 - 28 14  13  12    Sodium 134 - 144 mmol/L 141  142  139   Potassium 3.5 - 5.2 mmol/L 4.4  5.1  4.5   Chloride 96 - 106 mmol/L 101  105  101   CO2 20 - 29 mmol/L 23  24  21    Calcium 8.7 - 10.3 mg/dL 10.5  9.3  9.4       Latest Ref Rng & Units 09/19/2020   12:56 PM 04/27/2019    8:14 AM 09/18/2016    1:46 PM  Hepatic Function  Total Protein 6.0 - 8.5 g/dL 6.5  6.8  6.5   Albumin 3.7 - 4.7 g/dL 4.4  4.1  3.9   AST 0 - 40 IU/L 15  16   26    ALT 0 - 32 IU/L 13  13  20    Alk Phosphatase 44 - 121 IU/L 91  97  59   Total Bilirubin 0.0 - 1.2 mg/dL 0.3  0.2  0.6       Latest Ref Rng & Units 09/19/2020   12:56 PM 04/27/2019    8:14 AM 09/30/2016    7:12 AM  CBC  WBC 3.4 - 10.8 x10E3/uL 6.5  10.4  14.1   Hemoglobin 11.1 - 15.9 g/dL 13.2  13.0  10.6   Hematocrit 34.0 - 46.6 % 39.5  39.6  32.4   Platelets 150 - 450 x10E3/uL 278  260  170    Lab Results  Component Value Date   MCV 91 09/19/2020   MCV 88 04/27/2019   MCV 89.3 09/30/2016   Lab Results  Component Value Date   TSH 1.010 09/19/2020  No results found for: "HGBA1C"  Lipid Panel     Component Value Date/Time   CHOL 124 04/27/2019 0814   TRIG 84 04/27/2019 0814   HDL 47 04/27/2019 0814   CHOLHDL 2.6 04/27/2019 0814   CHOLHDL 3.9 02/14/2015 0400   VLDL 22 02/14/2015 0400   LDLCALC 61 04/27/2019 0814   I personally reviewed the blood work done at [facility] clinic from 11/02/2017: Potassium 4.6.  Creatinine 1.27.  Normal LFTs.  Cholesterol 144, triglycerides 90, HDL 43, LDL 82.  TSH 0.96.   RADIOLOGY: No results found.  IMPRESSION:  1. Essential hypertension   2. Abdominal bruit   3. CAD in native  artery   4. Hyperlipidemia LDL goal <70   5. Left bundle branch block   6. Ankle edema, bilateral     ASSESSMENT AND PLAN:  Ms.Kestler is a 72 year-old female who has a long-standing history of hypertension, which remotely improved with the addition of amlodipine 5 mg added to her Ziac and lisinopril therapy.  In 2015 she had experienced episodic chest tightness. Cardiac catheterization performed after a nuclear perfusion study raised the possibility of anteroseptal defect demonstrated smooth 40-50% LAD stenosis which did not completely normalize following intracoronary nitroglycerin, and otherwise normal coronary arteries.  She has remained stable without recurrent episodes of chest pain.  Lipid studies were still elevated in 2017 and  atorvastatin was  increased to 40 mg with improved results. She was hospitalized in [place] and was told of having a stroke which was presumed to be of cerebellar origin based on her description of significant symptoms.  She had an MRI, CT, carotid Doppler studies, and echo Doppler study showed normal ejection fraction at 55-60%.  There was no obvious evidence of a PFO on color Doppler or bubble study. A 2 week event monitor in June 2018 showed sinus rhythm with occasional PACs.  There were episodes of sinus tachycardia.  There was one episode of PSVT with heart rates of 144 bpm.  Of note, there was also an episode of sinus tachycardia at 5:30 AM while sleeping.  Remotely, due to fatigability on metoprolol she was switched to Bystolic and most recently has been on carvedilol 18.75 mg in the [facility] and 12.5 mg at night in addition to diltiazem to 40 mg daily, hydralazine 25 mg twice a day and HCTZ as needed depending upon leg swelling.  At times she has blood pressure lability.  Her resting pulse today is in the mid 70s.  I reviewed her abdominal ultrasound which was done due to to abdominal bruit.  This did not reveal any evidence for abdominal aortic aneurysm.  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  Presently, I am recommending slight titration of her carvedilol to 18.75 mg twice a day.  I am recommending repeat laboratory be obtained on her current therapy with a comprehensive metabolic panel, CBC, TSH, fasting lipid studies and will also check an LP(a).  I will contact her regarding the results.  I will see her in 6 months for reevaluation or sooner as needed.   Troy Sine, MD, [facility] 02/18/2022 5:27 PM

## 2022-08-06 NOTE — Patient Instructions (Addendum)
Medication Instructions:  Your physician has recommended you make the following change in your medication:  INCREASE: Carvedilol 18.75mg  (1.5 tablets twice daily)  *If you need a refill on your cardiac medications before your next appointment, please call your pharmacy*  Lab Work: Your physician recommends that you return at your earliest convenience to have the following labs drawn: CMET, CBC,Lipids, thyroid and LP(a)  If you have labs (blood work) drawn today and your tests are completely normal, you will receive your results only by: [place] (if you have MyChart) OR A paper copy in the mail If you have any lab test that is abnormal or we need to change your treatment, we will call you to review the results.   Testing/Procedures: NONE   Follow-Up: At [facility], you and your health needs are our priority.  As part of our continuing mission to provide you with exceptional heart care, we have created designated Provider Care Teams.  These Care Teams include your primary Cardiologist (physician) and Advanced Practice Providers (APPs -  Physician Assistants and Nurse Practitioners) who all work together to provide you with the care you need, when you need it.  We recommend signing up for the patient portal called "MyChart".  Sign up information is provided on this After Visit Summary.  MyChart is used to connect with patients for Virtual Visits (Telemedicine).  Patients are able to view lab/test results, encounter notes, upcoming appointments, etc.  Non-urgent messages can be sent to your provider as well.   To learn more about what you can do with MyChart, go to [email or web address].    Your next appointment:   6 month(s)  Provider:   Shelva Majestic, MD

## 2022-08-07 ENCOUNTER — Encounter: Payer: Self-pay | Admitting: Cardiovascular Disease

## 2022-08-07 ENCOUNTER — Other Ambulatory Visit: Payer: Self-pay

## 2022-08-07 DIAGNOSIS — I2510 Atherosclerotic heart disease of native coronary artery without angina pectoris: Secondary | ICD-10-CM

## 2022-08-07 DIAGNOSIS — E785 Hyperlipidemia, unspecified: Secondary | ICD-10-CM

## 2022-08-07 LAB — CBC

## 2022-08-08 LAB — COMPREHENSIVE METABOLIC PANEL
ALT: 15 IU/L (ref 0–32)
AST: 18 IU/L (ref 0–40)
Albumin/Globulin Ratio: 2.2 (ref 1.2–2.2)
Albumin: 4 g/dL (ref 3.8–4.8)
Alkaline Phosphatase: 77 IU/L (ref 44–121)
BUN/Creatinine Ratio: 15 (ref 12–28)
BUN: 16 mg/dL (ref 8–27)
Bilirubin Total: 0.4 mg/dL (ref 0.0–1.2)
CO2: 21 mmol/L (ref 20–29)
Calcium: 8.8 mg/dL (ref 8.7–10.3)
Chloride: 104 mmol/L (ref 96–106)
Creatinine, Ser: 1.1 mg/dL — ABNORMAL HIGH (ref 0.57–1.00)
Globulin, Total: 1.8 g/dL (ref 1.5–4.5)
Glucose: 140 mg/dL — ABNORMAL HIGH (ref 70–99)
Potassium: 4.3 mmol/L (ref 3.5–5.2)
Sodium: 139 mmol/L (ref 134–144)
Total Protein: 5.8 g/dL — ABNORMAL LOW (ref 6.0–8.5)
eGFR: 53 mL/min/{1.73_m2} — ABNORMAL LOW (ref >59)

## 2022-08-08 LAB — LIPOPROTEIN A (LPA): Lipoprotein (a): 195.3 nmol/L — ABNORMAL HIGH (ref <75.0)

## 2022-08-08 LAB — CBC
Hematocrit: 33.2 % — ABNORMAL LOW (ref 34.0–46.6)
Hemoglobin: 11 g/dL — ABNORMAL LOW (ref 11.1–15.9)
MCH: 29.3 pg (ref 26.6–33.0)
MCHC: 33.1 g/dL (ref 31.5–35.7)
MCV: 89 fL (ref 79–97)
Platelets: 225 10*3/uL (ref 150–450)
RBC: 3.75 x10E6/uL — ABNORMAL LOW (ref 3.77–5.28)
RDW: 13.4 % (ref 11.7–15.4)
WBC: 12 10*3/uL — ABNORMAL HIGH (ref 3.4–10.8)

## 2022-08-08 LAB — LIPID PANEL
Chol/HDL Ratio: 2.7 ratio (ref 0.0–4.4)
Cholesterol, Total: 115 mg/dL (ref 100–199)
HDL: 43 mg/dL (ref >39)
LDL Chol Calc (NIH): 54 mg/dL (ref 0–99)
Triglycerides: 96 mg/dL (ref 0–149)
VLDL Cholesterol Cal: 18 mg/dL (ref 5–40)

## 2022-08-08 LAB — THYROID PANEL
Free Thyroxine Index: 2.1 (ref 1.2–4.9)
T3 Uptake Ratio: 32 % (ref 24–39)
T4, Total: 6.5 ug/dL (ref 4.5–12.0)

## 2023-04-28 ENCOUNTER — Other Ambulatory Visit: Payer: Self-pay | Admitting: Cardiovascular Disease

## 2023-05-05 ENCOUNTER — Encounter: Payer: Self-pay | Admitting: Cardiovascular Disease

## 2023-05-05 ENCOUNTER — Ambulatory Visit: Payer: Medicare Other | Attending: Cardiovascular Disease | Admitting: Cardiovascular Disease

## 2023-05-05 VITALS — BP 118/74 | HR 62 | Ht 62.0 in | Wt 126.8 lb

## 2023-05-05 DIAGNOSIS — I10 Essential (primary) hypertension: Secondary | ICD-10-CM | POA: Diagnosis present

## 2023-05-05 DIAGNOSIS — I2510 Atherosclerotic heart disease of native coronary artery without angina pectoris: Secondary | ICD-10-CM | POA: Insufficient documentation

## 2023-05-05 DIAGNOSIS — E7841 Elevated Lipoprotein(a): Secondary | ICD-10-CM | POA: Diagnosis present

## 2023-05-05 DIAGNOSIS — I447 Left bundle-branch block, unspecified: Secondary | ICD-10-CM | POA: Insufficient documentation

## 2023-05-05 DIAGNOSIS — E785 Hyperlipidemia, unspecified: Secondary | ICD-10-CM | POA: Insufficient documentation

## 2023-05-05 DIAGNOSIS — K529 Noninfective gastroenteritis and colitis, unspecified: Secondary | ICD-10-CM | POA: Insufficient documentation

## 2023-05-05 NOTE — Patient Instructions (Signed)
Medication Instructions:  No medication changes *If you need a refill on your cardiac medications before your next appointment, please call your pharmacy*   Lab Work: Return for fasting labs in March of 2025...Marland KitchenMarland KitchenMarland Kitchen CMET, LIPID, TSH , CBC If you have labs (blood work) drawn today and your tests are completely normal, you will receive your results only by: MyChart Message (if you have MyChart) OR A paper copy in the mail If you have any lab test that is abnormal or we need to change your treatment, we will call you to review the results.   Testing/Procedures: none   Follow-Up: At [facility], you and your health needs are our priority.  As part of our continuing mission to provide you with exceptional heart care, we have created designated Provider Care Teams.  These Care Teams include your primary Cardiologist (physician) and Advanced Practice Providers (APPs -  Physician Assistants and Nurse Practitioners) who all work together to provide you with the care you need, when you need it.  We recommend signing up for the patient portal called "MyChart".  Sign up information is provided on this After Visit Summary.  MyChart is used to connect with patients for Virtual Visits (Telemedicine).  Patients are able to view lab/test results, encounter notes, upcoming appointments, etc.  Non-urgent messages can be sent to your provider as well.   To learn more about what you can do with MyChart, go to [email or web address].    Your next appointment:   6 month(s)  Provider:   Nicki Gu[place], MD

## 2023-05-05 NOTE — Progress Notes (Signed)
Patient ID: [name], female   DOB: 03-19-1951, 72 y.o.   MRN: [id number]       HPI:  [name] is a 72 y.o. female who is followed by Dr. Tanda Rockers in [place], [place].  Her uncle, [name], was one of my longstanding patients.  She presents to the office today for a 9 month follow-up cardiology evaluation.   [name] has a history of hypertension dating back for at least 2002, and remotely was maintained on lisinopril 10 mg and Ziac 5/6.25 mg daily.  She has experienced episodic chest pressure intermittently and had initially undergone a stress test a  7 years ago.  She also has a history of depression. 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 She had noticed increasing palpitations and also  developed episodes of chest tightness last year. She underwent a nuclear perfusion study in July 2015 She was felt to have a small fixed anteroseptal defect without ischemia.  Ejection fraction was 39%, but was felt that this may not be accurate due to many PVCs during the study.  She subsequently underwent a 2-D echo Doppler study in October 2015 in [place] , which demonstrated an ejection fraction at 55-60%.  There was mild left ventricular posterior wall thickness.  Check equivocal tissue Doppler assessment.  The peak pressure gradient across her aortic valve was 7.6 mm with a mean pressure gradient of 4.3 mm without evidence for significant aortic stenosis.  The patient states that recently she has noticed more chest tightness and this seems to occur with only walking halfway down her driveway.    When I initially saw her in December 2015,  I added amlodipine 5 mg to help both with blood pressure control as well as potential anti-ischemic benefit.   Due to recurrent symptoms, she underwent a catheterization on 08/03/2014 which revealed smooth 40-50% proximal LAD stenosis after the takeoff of the first diagonal vessel which did not significant normalize following  intracoronary nitroglycerin administration.  Otherwise her coronary anatomy was normal.  She had normal LV function without wall motion abnormalities and evidence for very mild  mitral valve prolapse without significant mitral regurgitation.  She was started on statin therapy with her LDL of 113 attempt to induce plaque stability and regression.  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  She denied any recurrent episodes of chest pain or tightness.  She denies presyncope or syncope.  She was told of having mild osteopenia on bone density imaging.  She underwent a CBC, and lipid study by her primary physician.  Her total cholesterol was 142, HDL 43, LDL 83, and triglycerides 76.  Hemoglobin and hematocrit were 13.2 and 39.1.  She denies any palpitations.She denies any recurrent chest pain.  She does note less shortness of breath.  She is on amlodipine 5 mg Ziac 5/6.25 mg, lisinopril 10 mg daily.  She is now on an increased atorvastatin dose at 40 mg daily.  Lab work from one year ago on 11/08/2014 showed a total cholesterol 236, LDL 163, triglycerides 124.  Her lipid studies have improved under increased atorvastatin dose.    When I saw her in January 2018 she had noticed an occasional pause when she takes her pulse.  She denies any sensation of tachycardia.  She denies presyncope.  She denies syncope.  There are no episodes of chest pain.  She was  need for right knee surgery to be done by Dr. Veda Canning.  She underwent her knee surgery vessel by Dr. Veda Canning on 09/28/2016.  She had developed some swelling initially post procedure.  She was doing well but on 12/11/2016, she developed right arm, hand weakness, nausea and vomiting.  She was hospitalized in [place] and was diagnosed as having a stroke.  Apparently, during that evaluation, she underwent CT imaging, MRI, carotid duplex imaging and appears that she may have had an echo bubble study.Since her discharge, she has  noticed significant improvement in her neurologic capacity.  According to her description, it sounds as though she was significant dysarthric and may have had a cerebellar infarct in the etiology of her presentation.  She was discharged on baby aspirin 81 mg in addition to her lisinopril 5 mg, Toprol XL 25 mg, atorvastatin 40 mg, amlodipine 5 mg.  when I saw her in follow-up at her last office visit, she denied any episodes of chest pain, was unaware of any atrial fibrillation and had noticed episodes of heart rates in the 90s to 100s.  I was able to obtain records from the patient's hospitalization in [place].  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  On 12/13/2016 an echo Doppler study showed an EF of 55-60% with normal right ventricular size with normal function.  She had mild left atrial dilation, mild MR, aortic sclerosis without stenosis, and mild TR.  She wore an event monitor in June 2018 which showed sinus rhythm.  She had episodes of sinus tachycardia with PACs, sinus rhythm with PACs, and was an episode of sinus rhythm with PSVT and bigeminal PACs.  Her heart rate had increased to 144 bpm.  During SVT episode.  She also was noted to have sinus tachycardia while sleeping at 5:30 AM for which she was unaware.  When I saw her in follow-up, she had not felt well on the metoprolol and I weaned and ultimately discontinued this and in its place started Bystolic at 5 mg with potential titration to 10 mg as tolerated.  I also discontinued her amlodipine and switch to Cardizem CD 180 mg, both for blood pressure control as well as in attempt to improve rate control.  With this medication adjustment, she has felt significantly better.    When I saw her in October 2018, her ECG was stable with a ventricular rate at 56 bpm.  Her blood pressure was elevated.  She had felt improved on Bystolic 5 mg in addition to Cardizem CD 180  mg.  I recommended further titration of lisinopril.    I saw her in April 2019 and at that time she felt well with reference to palpitations.  Her ECG showed occasional PACs.    I saw her in October 2019 and had noted the development of  right lower extremity edema which ultimately resolved when she wore compressions stockings.  She been her primary doctor who is scheduled her for Doppler studies but apparently this had never been done and her edema resolved.  She denies any chest pain or episodes of presyncope or syncope.   When evaluated in January 2020 she had some issues with urinary incontinence.  She had been given a prescription for Myrbetriq by he physician but she never filled this due to conmcerns about side effects.  She continued to experience right lower extremity swelling occasionally.  Her blood pressure was elevated and I recommended further titration of lisinopril to 30 mg.  I also recommended  HCTZ to take on an as-needed basis in light of her leg swelling.  Subsequent laboratory showed a BUN of 15 creatinine 1.27 on August 26, 2018.  I saw her in July 2020 at which time she was feeling well and denied chest pain, PND or orthopnea.   She brought with her her blood pressure log.  Her blood pressure has been labile and at times has been in the 160s and other times in the 110s to 120s.  She denies any significant leg swelling.  She has not taken the HCTZ.  She can no longer afford Bystolic and wanted to switch and according to her insurance her options are metoprolol tartrate or carvedilol.  In the past she did not tolerate metoprolol due to fatigability.  During that evaluation, I recommended changing Bystolic to carvedilol 12.5 mg twice a day and further titrated lisinopril to 40 mg daily due to her hypertension.  She states at home typically her blood pressure is running in the mid 130s systolically.  She was evaluated by me on May 15, 2019 at which time she continued to be  hypertensive.  Resting pulse was in the 70s and her ECG confirmed sinus rhythm.  I recommended slight titration of carvedilol to 18.75 mg in the [facility] and 12.5 mg at night.  With her history of prior SVT and ongoing hypertension I recommended she change her Cardizem CD to 240 mg at bedtime and continue the lisinopril 40 mg.  She developed bradycardia dose reduction of carvedilol will be necessary.  When I saw her in February 2021 her blood pressure at home has been stable typically running in the 120s to 130 systolically.  She was unaware of any burst of SVT or palpitations.  She has had issues with arthritis of her low back and recently received Depo-Medrol injection by Dr. Tanda Rockers.  She denies any anginal symptoms.  She denies PND orthopnea.  There is no exertional dyspnea.   I saw her in August 2021 and prior to that evaluation she had issues with spinal stenosis of her lower lumbar vertebrae.  She has undergone 2 injections of Depo-Medrol and recently was started on meloxicam.  She was to see Dr. Kyung Rudd next week for follow-up laboratory.  She has been monitoring her blood pressure at home and most of the time blood pressure is very stable less than 130 systolic.  There have been couple instances where it had gotten into the 140s.  She has been on lisinopril 40 mg daily, diltiazem 240 mg, carvedilol 18.75 mg in the [facility] and 12.5 mg at night and has a prescription for HCTZ.  She has been averaging 1-2 times per month 12.5 mg depending upon swelling or blood pressure.  She continues to be on atorvastatin 40 mg.  She is unaware of any chest pain.  She is unaware of any episodes of heart rate irregularity or tachycardia.  She has not been as active as she had in the past due to her arthritic conditions.  Since she had not been taking her hydrochlorothiazide and her blood pressure was mildly elevated and I suggested that she take HCTZ if her blood pressure was greater than 135.  I saw her in February 2022.   She was no longer taking meloxicam and has been taking Tylenol for some arthritic issues.  She has been taking hydrochlorothiazide as a as needed basis.  She was unaware of any palpitations or rhythm disturbance.  She denied presyncope or syncope.  During that evaluation,  she was on carvedilol 18.75 mg in the [facility] and 12.5 mg at night, diltiazem 240 mg daily, lisinopril 40 mg and was taking HCTZ on an as-needed basis.  Her blood pressure was elevated and I added hydralazine 25 mg twice a day to her medical regimen.  She continued to be on atorvastatin 40 mg daily.  I saw her in January 21, 2021.  She had undergone successful left knee replacement surgery by Dr. Donata Duff approximately 7 weeks ago.  She tolerated surgery well.  She denies any chest pain or shortness of breath.  She initially had left leg swelling following her surgery and this has improved.  She has continued to be on carvedilol 18.7 5 in the [facility] and 12.5 mg in the evening, diltiazem CD2 140 mg daily, lisinopril 40 mg, hydralazine 25 mg twice a day.  She is on atorvastatin for hyperlipidemia.  Lab work 2 months ago showed hemoglobin A1c at 6.3.  She has aggressively been watching her diet.  During that evaluation her blood pressure was mildly elevated and I recommended slight titration of hydralazine to 50 mg twice a day with target blood pressure less than 130/80.  I saw her on August 07, 2021.  At that time she felt well but at times after taking her [facility] medicine she would notice her blood pressure dropping into the upper 90s to low 100s and several hours later she felt improved.  She is to undergo laboratory with Dr. Tanda Rockers.  She continues to have some issues with sciatica.  She denies chest pain.  She is unaware of palpitations.  At that time she was no longer taking lisinopril and was taking carvedilol 18.75 mg twice a day, diltiazem 240 mg daily and HCTZ 12.5 mg on an as-needed basis.  With her symptomatology I recommended she reduce  her hydralazine back down to 25 mg twice a day I recommended she take her diltiazem 240 mg .  I saw her on February 18, 2022.  Since her prior evaluation she has felt well and denied any chest pain..  She admits to ankle swelling which is mild which seems to occur on a daily basis.  She is now on carvedilol 18.75 mg in the [facility] and 12.5 mg at night, diltiazem 240 mg at night, hydralazine 25 mg twice a day, and rarely has taken HCTZ.  She continues to be on atorvastatin 40 mg for hyperlipidemia.  She is on Neurontin for neuropathy.  She continues to be on Zanaflex as needed for muscle spasms.  At that time I suggested she take HCTZ 12.5 mg initially every other day and depending upon blood pressure and edema adjustments could be made to her usage.  With her age of 29 years I recommended she undergo abdominal aortic aneurysm ultrasound screening.  On March 05, 2022 her ultrasound of her abdominal aorta was normal with no evidence for abdominal aortic aneurysm.  There was evidence for minimal atherosclerosis in the iliac arteries without significant stenosis.  I last saw her on August 06, 2022 at which time she felt well from a cardiac standpoint but was having issues with her cervical spine and disc disease involving C3 and C5.  She has continued to be on atorvastatin 40 mg for hyperlipidemia.  She is on carvedilol 18.75 mg in the [facility] and 12.5 mg at night, diltiazem 240 mg daily, hydralazine 25 mg twice a day, HCTZ 12.5 mg every other day or as needed for swelling.  She sees Dr. Tanda Rockers in  [place] for primary care.  During that evaluation I recommended slight titration of carvedilol to 18.75 mg twice a day and recommended follow-up laboratory with an evaluation also for LP(a).  LP(a) was significantly elevated at 1 9523.  Since I last saw her, she became dehydrated and apparently was hospitalized in late September for 7 days with abdominal pain diarrhea and apparently was diagnosed with colitis.  She  was treated with antibiotics.  With her low blood pressure, she was taken off hydralazine and hydrochlorothiazide.  Colonoscopy was recommended to be done in [place].  Presently, she feels improved.  She is on atorvastatin 40 mg and aspirin for lipid management and with her LP(a) elevation.  She continues to be on carvedilol 18.75 mg twice a day, diltiazem 240 mg daily, and has a prescription for HCTZ but is not taking presently.  She denies any chest pain or increasing shortness of breath.  She presents for evaluation.   Past medical history is notable for hypertension, palpitations, depressive disorder, osteoarthritis.  Past surgical history is notable for partial thyroidectomy while she was in eighth grade, and recent right knee surgery  Current Outpatient Medications  Medication Sig Dispense Refill   amoxicillin (AMOXIL) 500 MG capsule Take 500 mg by mouth. Before dental procedures     ARTIFICIAL TEAR OP Apply 1 drop to eye daily as needed (dry eyes).     aspirin EC 81 MG tablet Take 1 tablet (81 mg total) by mouth daily. 90 tablet 3   atorvastatin (LIPITOR) 40 MG tablet TAKE 1 TABLET(40 MG) BY MOUTH DAILY 90 tablet 3   calcium carbonate (OS-CAL) 600 MG TABS tablet Take by mouth.     carvedilol (COREG) 12.5 MG tablet Take 1.5 tablets (18.75 mg total) by mouth in the [facility] and at bedtime. 270 tablet 3   Cholecalciferol 125 MCG (5000 UT) capsule Take by mouth.     Coenzyme Q10 (COQ-10) 200 MG CAPS Take 200 mg by mouth daily.     diltiazem (CARDIZEM CD) 240 MG 24 hr capsule TAKE 1 CAPSULE(240 MG) BY MOUTH DAILY 90 capsule 3   FEROSUL 325 (65 Fe) MG tablet Take 325 mg by mouth daily.     nitroGLYCERIN (NITROSTAT) 0.4 MG SL tablet Place 1 tablet (0.4 mg total) under the tongue every 5 (five) minutes as needed for chest pain. 25 tablet 3   NON FORMULARY Take by mouth.     ondansetron (ZOFRAN) 4 MG tablet Take 4 mg by mouth every 8 (eight) hours as needed.     tiZANidine (ZANAFLEX) 4 MG  tablet Take 4 mg by mouth every 6 (six) hours as needed.     traMADol (ULTRAM) 50 MG tablet Take 50 mg by mouth every 6 (six) hours as needed.     Biotin 1 MG CAPS Take by mouth. (Patient not taking: Reported on 05/05/2023)     CALCIUM PO Take 1,200 mg by mouth 2 (two) times daily. (Patient not taking: Reported on 05/05/2023)     gabapentin (NEURONTIN) 300 MG capsule Take 300 mg by mouth at bedtime. (Patient not taking: Reported on 05/05/2023)     hydrochlorothiazide (MICROZIDE) 12.5 MG capsule Take 1 capsule (12.5 mg total) by mouth every other day. 45 capsule 3   Omega-3 Fatty Acids (FISH OIL ULTRA) 1400 MG CAPS Take 1,400 mg by mouth 2 (two) times daily. (Patient not taking: Reported on 05/05/2023)     oxyCODONE-acetaminophen (PERCOCET/ROXICET) 5-325 MG tablet Take by mouth. (Patient not taking: Reported on 05/05/2023)  No current facility-administered medications for this visit.   Social history is notable in that she is married for >40 years.  She does not have any children.  She lives with her husband.  She completed 12th grade of education.  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  There is no tobacco history or alcohol use.  She does care for many dogs.  Family History  Problem Relation Age of Onset   Heart failure Mother    Sudden death Father    COPD Father    Cancer Brother    Cancer Sister    Family history is notable that her mother died at age 21 with possible congestive heart failure.  Her father died at 79 with sudden death.  He had COPD.  A brother died at 73 with cancer and a sister died at 78 with cancer.   ROS General: Negative; No fevers, chills, or night sweats HEENT: Negative; No changes in vision or hearing, sinus congestion, difficulty swallowing Pulmonary: Negative; No cough, wheezing, shortness of breath, hemoptysis Cardiovascular:  See HPI;  GI: Recent dehydration, abdominal pain, diarrhea, diagnosed with colitis treated  with antibiotics. GU: Occasional urinary incontinence Musculoskeletal: Positive for arthritis in her right knee; status post left knee replacement Hematologic/Oncologic: Negative; no easy bruising, bleeding Endocrine: History of partial thyroidectomy Neuro: Negative; no changes in balance, headaches Skin: Negative; No rashes or skin lesions Psychiatric: Prior history of depression, currently stable Sleep: Negative; No daytime sleepiness, hypersomnolence, bruxism, restless legs, hypnogagnic hallucinations Other comprehensive 14 point system review is negative   Physical Exam BP 118/74   Pulse 62   Ht 5\' 2"  (1.575 m)   Wt 126 lb 12.8 oz (57.5 kg)   LMP 07/27/1998 (LMP Unknown)   SpO2 98%   BMI 23.19 kg/m    Repeat blood pressure by me was 134/66  Wt Readings from Last 3 Encounters:  05/05/23 126 lb 12.8 oz (57.5 kg)  08/06/22 122 lb 12.8 oz (55.7 kg)  02/18/22 120 lb (54.4 kg)   General: Alert, oriented, no distress.  Skin: normal turgor, no rashes, warm and dry HEENT: Normocephalic, atraumatic. Pupils equal round and reactive to light; sclera anicteric; extraocular muscles intact;  Nose without nasal septal hypertrophy Mouth/Parynx benign; Mallinpatti scale 3 Neck: No JVD, no carotid bruits; normal carotid upstroke Lungs: clear to ausculatation and percussion; no wheezing or rales Chest wall: without tenderness to palpitation Heart: PMI not displaced, RRR, s1 s2 normal, 1/6 systolic murmur, no diastolic murmur, no rubs, gallops, thrills, or heaves Abdomen: soft, nontender; no hepatosplenomehaly, BS+; abdominal aorta nontender and not dilated by palpation. Back: no CVA tenderness Pulses 2+ Musculoskeletal: full range of motion, normal strength, no joint deformities Extremities: no clubbing cyanosis or edema, Homan's sign negative  Neurologic: grossly nonfocal; Cranial nerves grossly wnl Psychologic: Normal mood and affect  EKG Interpretation Date/Time:  Wednesday May 05 2023 11:48:49 EDT Ventricular Rate:  56 PR Interval:  154 QRS Duration:  118 QT Interval:  456 QTC Calculation: 440 R Axis:   137  Text Interpretation: Sinus bradycardia Right axis deviation Incomplete left bundle branch block When compared with ECG of 03-Aug-2014 10:20, Incomplete left bundle branch block is now Present Confirmed by Nicki Guadalajara ([id number]) on 05/08/2023 12:51:55 PM    August 06, 2022 ECG (independently read by me): NSR at 67; PVcs  February 18, 2022 ECG (independently read by me): NSR at 70, LBBB   August 07, 2021 ECG (independently  read by me):  Sinus rhythm at 74, [facility]  January 21, 2021 ECG (independently read by me): Sinus Bradycardia at 57, ILBBB  February 2022 ECG (independently read by me): NSR at 60; LBBB; QTc 424 msec, no ectopy  August 2021 ECG (independently read by me): Sinus bradycardia 56 bpm.  Left axis deviation.  Borderline left bundle branch block.  No ectopy.    February 2021 ECG (independently read by me): Sinus bradycardia 55 bpm, left axis deviation, left bundle branch block.   October 2020 ECG (independently read by me): Sinus ryhthm at 70 with PVCs; nonspecific ST changes; QTc 124 msec  July 2020 ECG (independently read by me): Sinus Bradycardia at 56; NSSTT changes  January 2010 ECG (independently read by me): Sinus bradycardia 59 bpm.  Left bundle branch block with repolarization changes.  May 09, 2018 ECG (independently read by me): Sinus bradycardia 55 bpm.  Left bundle branch block with repolarization changes.  No ectopy.  November 08, 2017 ECG (independently read by me): Normal sinus rhythm at 66 bpm with occasional PACs.  Left bundle branch block with mild repolarization  October 2018 ECG (independently read by me): Sinus bradycardia 56 bpm.  Left axis deviation.  Left bundle branch block.  QTc interval 424 ms.  August 2018 ECG (independently read by me): Sinus rhythm at 90 bpm with frequent PVCs with transient bigeminy.  May 2018 ECG  (independently read by me): Sinus rhythm at 97 bpm with frequent PVCs.  Left bundle branch block.  PR interval 142.  QTc interval 457 ms.  January 2018 ECG (independently read by me): Sinus rhythm at 64 bpm.  Occasional isolated PVC followed by compensatory pause.  QTc interval 44 ms.  PR interval 128 ms.  No ST segment changes.  July 2017 ECG (independently read by me): Sinus bradycardia 56 bpm.  Normal intervals.  Previously noted T-wave changes in leads 3 and aVF.  ECG (independently read by me): sinus bradycardia 57.  Normal intervals.  Nondiagnostic T changes in lead 27 Nov 2014 ECG (independently read by me): Sinus bradycardia 52 bpm.  ST changes improved.  QTc interval 381 ms.  07/26/2014 ECG (independently read by me): Normal sinus rhythm at 68 bpm.  There is mild downsloping ST segment depression in leads II, III, and F V4 through V6, which is slightly more progressive since the patient's prior ECG done in [place].  LABS: Laboratory from 11/11/2015 including a lipid panel and CBC were reviewed.     Latest Ref Rng & Units 08/07/2022    9:26 AM 09/19/2020   12:56 PM 04/27/2019    8:14 AM  BMP  Glucose 70 - 99 mg/dL 696  295  284   BUN 8 - 27 mg/dL 16  20  17    Creatinine 0.57 - 1.00 mg/dL 1.32  4.40  1.02   BUN/Creat Ratio 12 - 28 15  14  13    Sodium 134 - 144 mmol/L 139  141  142   Potassium 3.5 - 5.2 mmol/L 4.3  4.4  5.1   Chloride 96 - 106 mmol/L 104  101  105   CO2 20 - 29 mmol/L 21  23  24    Calcium 8.7 - 10.3 mg/dL 8.8  72.5  9.3       Latest Ref Rng & Units 08/07/2022    9:26 AM 09/19/2020   12:56 PM 04/27/2019    8:14 AM  Hepatic Function  Total Protein 6.0 - 8.5 g/dL 5.8  6.5  6.8   Albumin 3.8 - 4.8 g/dL 4.0  4.4  4.1   AST 0 - 40 IU/L 18  15  16    ALT 0 - 32 IU/L 15  13  13    Alk Phosphatase 44 - 121 IU/L 77  91  97   Total Bilirubin 0.0 - 1.2 mg/dL 0.4  0.3  0.2       Latest Ref Rng & Units 08/07/2022    9:26 AM 09/19/2020   12:56 PM 04/27/2019    8:14  AM  CBC  WBC 3.4 - 10.8 x10E3/uL 12.0  6.5  10.4   Hemoglobin 11.1 - 15.9 g/dL 16.1  09.6  04.5   Hematocrit 34.0 - 46.6 % 33.2  39.5  39.6   Platelets 150 - 450 x10E3/uL 225  278  260    Lab Results  Component Value Date   MCV 89 08/07/2022   MCV 91 09/19/2020   MCV 88 04/27/2019   Lab Results  Component Value Date   TSH 1.010 09/19/2020  No results found for: "HGBA1C"  Lipid Panel     Component Value Date/Time   CHOL 115 08/07/2022 0926   TRIG 96 08/07/2022 0926   HDL 43 08/07/2022 0926   CHOLHDL 2.7 08/07/2022 0926   CHOLHDL 3.9 02/14/2015 0400   VLDL 22 02/14/2015 0400   LDLCALC 54 08/07/2022 0926   I personally reviewed the blood work done at [facility] in clinic from 11/02/2017: Potassium 4.6.  Creatinine 1.27.  Normal LFTs.  Cholesterol 144, triglycerides 90, HDL 43, LDL 82.  TSH 0.96.   RADIOLOGY: No results found.  IMPRESSION:  1. Essential hypertension   2. CAD in native artery   3. Left bundle branch block   4. Hyperlipidemia LDL goal <70   5. Elevated Lp(a)   6. Colitis      ASSESSMENT AND PLAN:  Ms.Zingale is a 72 year-old female who has a long-standing history of hypertension, which remotely improved with the addition of amlodipine 5 mg added to her Ziac and lisinopril therapy.  In 2015 she had experienced episodic chest tightness. Cardiac catheterization performed after a nuclear perfusion study raised the possibility of anteroseptal defect demonstrated smooth 40-50% LAD stenosis which did not completely normalize following intracoronary nitroglycerin, and otherwise normal coronary arteries.  She has remained stable without recurrent episodes of chest pain.  Lipid studies were still elevated in 2017 and  atorvastatin was increased to 40 mg with improved results. 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  She had an MRI, CT, carotid  Doppler studies, and echo Doppler study showed normal ejection fraction at 55-60%.  There was no obvious evidence of a PFO on color Doppler or bubble study. A 2 week event monitor in June 2018 showed sinus rhythm with occasional PACs.  There were episodes of sinus tachycardia.  There was one episode of PSVT with heart rates of 144 bpm.  Of note, there was also an episode of sinus tachycardia at 5:30 AM while sleeping.  Remotely, due to fatigability on metoprolol she was switched to Bystolic and most recently has been on carvedilol 18.75 mg in the [facility] and 12.5 mg at night in addition to diltiazem to 40 mg daily, hydralazine 25 mg twice a day and HCTZ as needed depending upon leg swelling.  Carvedilol was increased to 18.75 mg in January 2024.  Her abdominal ultrasound  did not reveal any evidence for abdominal aortic aneurysm as a cause for her soft bruit.  She recently was hospitalized in the setting of dehydration, abdominal pain, was felt to be septic and was found to have colitis requiring antibiotic therapy.  She had blood in her feces and also low iron levels.  She subsequently has undergone colonoscopy.  Presently she is improved and no longer on hydralazine and HCTZ.  Her blood pressure today is relatively stable and on presentation was 118/74.  LP(a) is significantly elevated at 195.3.  LDL cholesterol in January 2024 was excellent at 54.  With her elevated LP(a) she is on aspirin 81 mg.  I have recommended follow-up laboratory be obtained in March 2025 and I will see her in 6 months with her husband for reevaluation.    Lennette Bihari, MD, [facility] 05/08/2023 12:56 PM

## 2023-05-08 ENCOUNTER — Encounter: Payer: Self-pay | Admitting: Cardiovascular Disease

## 2023-08-10 ENCOUNTER — Other Ambulatory Visit: Payer: Self-pay | Admitting: Cardiovascular Disease

## 2023-08-12 ENCOUNTER — Other Ambulatory Visit: Payer: Self-pay | Admitting: Cardiovascular Disease

## 2023-10-19 LAB — CBC

## 2023-10-20 LAB — LIPID PANEL
Chol/HDL Ratio: 2.7 ratio (ref 0.0–4.4)
Cholesterol, Total: 118 mg/dL (ref 100–199)
HDL: 44 mg/dL (ref >39)
LDL Chol Calc (NIH): 56 mg/dL (ref 0–99)
Triglycerides: 97 mg/dL (ref 0–149)
VLDL Cholesterol Cal: 18 mg/dL (ref 5–40)

## 2023-10-20 LAB — CBC
Hematocrit: 37.4 % (ref 34.0–46.6)
Hemoglobin: 12.4 g/dL (ref 11.1–15.9)
MCH: 30.8 pg (ref 26.6–33.0)
MCHC: 33.2 g/dL (ref 31.5–35.7)
MCV: 93 fL (ref 79–97)
Platelets: 269 10*3/uL (ref 150–450)
RBC: 4.03 x10E6/uL (ref 3.77–5.28)
RDW: 14.4 % (ref 11.7–15.4)
WBC: 8.9 10*3/uL (ref 3.4–10.8)

## 2023-10-20 LAB — TSH: TSH: 1.58 u[IU]/mL (ref 0.450–4.500)

## 2023-10-20 LAB — COMPREHENSIVE METABOLIC PANEL
ALT: 21 IU/L (ref 0–32)
AST: 25 IU/L (ref 0–40)
Albumin: 4 g/dL (ref 3.8–4.8)
Alkaline Phosphatase: 84 IU/L (ref 44–121)
BUN/Creatinine Ratio: 19 (ref 12–28)
BUN: 22 mg/dL (ref 8–27)
Bilirubin Total: 0.3 mg/dL (ref 0.0–1.2)
CO2: 23 mmol/L (ref 20–29)
Calcium: 9.2 mg/dL (ref 8.7–10.3)
Chloride: 104 mmol/L (ref 96–106)
Creatinine, Ser: 1.15 mg/dL — ABNORMAL HIGH (ref 0.57–1.00)
Globulin, Total: 2.3 g/dL (ref 1.5–4.5)
Glucose: 122 mg/dL — ABNORMAL HIGH (ref 70–99)
Potassium: 4.7 mmol/L (ref 3.5–5.2)
Sodium: 140 mmol/L (ref 134–144)
Total Protein: 6.3 g/dL (ref 6.0–8.5)
eGFR: 50 mL/min/{1.73_m2} — ABNORMAL LOW (ref >59)

## 2023-11-01 ENCOUNTER — Telehealth: Payer: Self-pay | Admitting: Cardiovascular Disease

## 2023-11-01 NOTE — Telephone Encounter (Signed)
 Pt calling in regards to results. Please advise

## 2023-11-01 NOTE — Telephone Encounter (Signed)
 Patient identification verified by 2 forms. Marilynn Rail, RN    Called and spoke to patient  Patient states she is aware of Mychart message with results  Patient has no further questions at this time

## 2023-12-02 ENCOUNTER — Ambulatory Visit: Payer: Medicare Other | Attending: Cardiovascular Disease | Admitting: Cardiovascular Disease

## 2023-12-02 ENCOUNTER — Encounter: Payer: Self-pay | Admitting: Cardiovascular Disease

## 2023-12-02 VITALS — BP 142/58 | HR 54 | Ht 62.0 in | Wt 131.8 lb

## 2023-12-02 DIAGNOSIS — E7841 Elevated Lipoprotein(a): Secondary | ICD-10-CM

## 2023-12-02 DIAGNOSIS — E785 Hyperlipidemia, unspecified: Secondary | ICD-10-CM | POA: Diagnosis present

## 2023-12-02 DIAGNOSIS — I10 Essential (primary) hypertension: Secondary | ICD-10-CM

## 2023-12-02 DIAGNOSIS — R0989 Other specified symptoms and signs involving the circulatory and respiratory systems: Secondary | ICD-10-CM | POA: Diagnosis present

## 2023-12-02 DIAGNOSIS — I2510 Atherosclerotic heart disease of native coronary artery without angina pectoris: Secondary | ICD-10-CM | POA: Diagnosis present

## 2023-12-02 DIAGNOSIS — I447 Left bundle-branch block, unspecified: Secondary | ICD-10-CM

## 2023-12-02 DIAGNOSIS — K529 Noninfective gastroenteritis and colitis, unspecified: Secondary | ICD-10-CM

## 2023-12-02 MED ORDER — NITROGLYCERIN 0.4 MG SL SUBL: 0.4000 mg | SUBLINGUAL_TABLET | SUBLINGUAL | 3 refills | Status: AC | PRN

## 2023-12-02 NOTE — Patient Instructions (Signed)
 Medication Instructions:  Your physician recommends that you continue on your current medications as directed. Please refer to the Current Medication list given to you today.  *If you need a refill on your cardiac medications before your next appointment, please call your pharmacy*  Lab Work: None ordered  If you have labs (blood work) drawn today and your tests are completely normal, you will receive your results only by: MyChart Message (if you have MyChart) OR A paper copy in the mail If you have any lab test that is abnormal or we need to change your treatment, we will call you to review the results.  Testing/Procedures: None ordered  Follow-Up: At [facility], you and your health needs are our priority.  As part of our continuing mission to provide you with exceptional heart care, our providers are all part of one team.  This team includes your primary Cardiologist (physician) and Advanced Practice Providers or APPs (Physician Assistants and Nurse Practitioners) who all work together to provide you with the care you need, when you need it.  Your next appointment:   6 month(s)  Provider:   Wendie Hamburg, MD    We recommend signing up for the patient portal called "MyChart".  Sign up information is provided on this After Visit Summary.  MyChart is used to connect with patients for Virtual Visits (Telemedicine).  Patients are able to view lab/test results, encounter notes, upcoming appointments, etc.  Non-urgent messages can be sent to your provider as well.   To learn more about what you can do with MyChart, go to [email or web address].   Other Instructions Monitor your blood pressure more closely.

## 2023-12-02 NOTE — Progress Notes (Signed)
 Patient ID: [name], female   DOB: [birth year], 73 y.o.   MRN: [id number]       HPI:  [name] is a 73 y.o. female who is followed by Dr. Rankin Kaiser in [place], [place] .  Her uncle, [name], was one of my longstanding patients.  [name] has a history of hypertension dating back for at least 2002, and remotely was maintained on lisinopril  10 mg and Ziac 5/6.25 mg daily.  She has experienced episodic chest pressure intermittently and had initially undergone a stress test a  7 years ago.  She also has a history of depression. 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 She had noticed increasing palpitations and also  developed episodes of chest tightness last year. She underwent a nuclear perfusion study in July 2015 She was felt to have a small fixed anteroseptal defect without ischemia.  Ejection fraction was 39%, but was felt that this may not be accurate due to many PVCs during the study.  She subsequently underwent a 2-D echo Doppler study in October 2015 in [place]  , which demonstrated an ejection fraction at 55-60%.  There was mild left ventricular posterior wall thickness.  Check equivocal tissue Doppler assessment.  The peak pressure gradient across her aortic valve was 7.6 mm with a mean pressure gradient of 4.3 mm without evidence for significant aortic stenosis.  The patient states that recently she has noticed more chest tightness and this seems to occur with only walking halfway down her driveway.    When I initially saw her in December 2015,  I added amlodipine  5 mg to help both with blood pressure control as well as potential anti-ischemic benefit.   Due to recurrent symptoms, she underwent a catheterization on 08/03/2014 which revealed smooth 40-50% proximal LAD stenosis after the takeoff of the first diagonal vessel which did not significant normalize following  intracoronary nitroglycerin  administration.  Otherwise her coronary anatomy was normal.  She had normal LV function without wall motion abnormalities and evidence for very mild  mitral valve prolapse without significant mitral regurgitation.  She was started on statin therapy with her LDL of 113 attempt to induce plaque stability and regression.  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  She denied any recurrent episodes of chest pain or tightness.  She denies presyncope or syncope.  She was told of having mild osteopenia on bone density imaging.  She underwent a CBC, and lipid study by her primary physician.  Her total cholesterol was 142, HDL 43, LDL 83, and triglycerides 76.  Hemoglobin and hematocrit were 13.2 and 39.1.  She denies any palpitations.She denies any recurrent chest pain.  She does note less shortness of breath.  She is on amlodipine  5 mg Ziac 5/6.25 mg, lisinopril  10 mg daily.  She is now on an increased atorvastatin  dose at 40 mg daily.  Lab work from one year ago on 11/08/2014 showed a total cholesterol 236, LDL 163, triglycerides 124.  Her lipid studies have improved under increased atorvastatin  dose.    When I saw her in January 2018 she had noticed an occasional pause when she takes her pulse.  She denies any sensation of tachycardia.  She denies presyncope.  She denies syncope.  There are no episodes of chest pain.  She was  need for right knee surgery to be done by Dr. Carlye Kaiser.  She underwent her knee surgery vessel by Dr. Carlye Kaiser on 09/28/2016.  She had developed some swelling initially post procedure.  She was doing well but on 12/11/2016, she developed right arm, hand weakness, nausea and vomiting.  She was hospitalized in [place] and was diagnosed as having a stroke.  Apparently, during that evaluation, she underwent CT imaging, MRI, carotid duplex imaging and appears that she may have had an echo bubble study.Since her discharge, she has  noticed significant improvement in her neurologic capacity.  According to her description, it sounds as though she was significant dysarthric and may have had a cerebellar infarct in the etiology of her presentation.  She was discharged on baby aspirin  81 mg in addition to her lisinopril  5 mg, Toprol  XL 25 mg, atorvastatin  40 mg, amlodipine  5 mg.  when I saw her in follow-up at her last office visit, she denied any episodes of chest pain, was unaware of any atrial fibrillation and had noticed episodes of heart rates in the 90s to 100s.  I was able to obtain records from the patient's hospitalization in [place].  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  On 12/13/2016 an echo Doppler study showed an EF of 55-60% with normal right ventricular size with normal function.  She had mild left atrial dilation, mild MR, aortic sclerosis without stenosis, and mild TR.  She wore an event monitor in June 2018 which showed sinus rhythm.  She had episodes of sinus tachycardia with PACs, sinus rhythm with PACs, and was an episode of sinus rhythm with PSVT and bigeminal PACs.  Her heart rate had increased to 144 bpm.  During SVT episode.  She also was noted to have sinus tachycardia while sleeping at 5:30 AM for which she was unaware.  When I saw her in follow-up, she had not felt well on the metoprolol  and I weaned and ultimately discontinued this and in its place started Bystolic  at 5 mg with potential titration to 10 mg as tolerated.  I also discontinued her amlodipine  and switch to Cardizem  CD 180 mg, both for blood pressure control as well as in attempt to improve rate control.  With this medication adjustment, she has felt significantly better.    When I saw her in October 2018, her ECG was stable with a ventricular rate at 56 bpm.  Her blood pressure was elevated.  She had felt improved on Bystolic  5 mg in addition to Cardizem  CD 180  mg.  I recommended further titration of lisinopril .    I saw her in April 2019 and at that time she felt well with reference to palpitations.  Her ECG showed occasional PACs.    I saw her in October 2019 and had noted the development of  right lower extremity edema which ultimately resolved when she wore compressions stockings.  She been her primary doctor who is scheduled her for Doppler studies but apparently this had never been done and her edema resolved.  She denies any chest pain or episodes of presyncope or syncope.   When evaluated in January 2020 she had some issues with urinary incontinence.  She had been given a prescription for Myrbetriq by he physician but she never filled this due to conmcerns about side effects.  She continued to experience right lower extremity swelling occasionally.  Her blood pressure was elevated and I recommended further titration of lisinopril  to 30 mg.  I also recommended  HCTZ to take on an as-needed basis in light of her leg swelling.  Subsequent laboratory showed a BUN of 15 creatinine 1.27 on August 26, 2018.  I saw her in July 2020 at which time she was feeling well and denied chest pain, PND or orthopnea.   She brought with her her blood pressure log.  Her blood pressure has been labile and at times has been in the 160s and other times in the 110s to 120s.  She denies any significant leg swelling.  She has not taken the HCTZ.  She can no longer afford Bystolic  and wanted to switch and according to her insurance her options are metoprolol  tartrate or carvedilol .  In the past she did not tolerate metoprolol  due to fatigability.  During that evaluation, I recommended changing Bystolic  to carvedilol  12.5 mg twice a day and further titrated lisinopril  to 40 mg daily due to her hypertension.  She states at home typically her blood pressure is running in the mid 130s systolically.  She was evaluated by me on May 15, 2019 at which time she continued to be  hypertensive.  Resting pulse was in the 70s and her ECG confirmed sinus rhythm.  I recommended slight titration of carvedilol  to 18.75 mg in the morning and 12.5 mg at night.  With her history of prior SVT and ongoing hypertension I recommended she change her Cardizem  CD to 240 mg at bedtime and continue the lisinopril  40 mg.  She developed bradycardia dose reduction of carvedilol  will be necessary.  When I saw her in February 2021 her blood pressure at home has been stable typically running in the 120s to 130 systolically.  She was unaware of any burst of SVT or palpitations.  She has had issues with arthritis of her low back and recently received Depo-Medrol injection by Dr. Rankin Kaiser.  She denies any anginal symptoms.  She denies PND orthopnea.  There is no exertional dyspnea.   I saw her in August 2021 and prior to that evaluation she had issues with spinal stenosis of her lower lumbar vertebrae.  She has undergone 2 injections of Depo-Medrol and recently was started on meloxicam.  She was to see Dr. Purnell Bruch next week for follow-up laboratory.  She has been monitoring her blood pressure at home and most of the time blood pressure is very stable less than 130 systolic.  There have been couple instances where it had gotten into the 140s.  She has been on lisinopril  40 mg daily, diltiazem  240 mg, carvedilol  18.75 mg in the morning and 12.5 mg at night and has a prescription for HCTZ.  She has been averaging 1-2 times per month 12.5 mg depending upon swelling or blood pressure.  She continues to be on atorvastatin  40 mg.  She is unaware of any chest pain.  She is unaware of any episodes of heart rate irregularity or tachycardia.  She has not been as active as she had in the past due to her arthritic conditions.  Since she had not been taking her hydrochlorothiazide  and her blood pressure was mildly elevated and I suggested that she take HCTZ if her blood pressure was greater than 135.  I saw her in February 2022.   She was no longer taking meloxicam and has been taking Tylenol  for some arthritic issues.  She has been taking hydrochlorothiazide  as a as needed basis.  She was unaware of any palpitations or rhythm disturbance.  She denied presyncope or syncope.  During that evaluation,  she was on carvedilol  18.75 mg in the morning and 12.5 mg at night, diltiazem  240 mg daily, lisinopril  40 mg and was taking HCTZ on an as-needed basis.  Her blood pressure was elevated and I added hydralazine  25 mg twice a day to her medical regimen.  She continued to be on atorvastatin  40 mg daily.  I saw her in January 21, 2021.  She had undergone successful left knee replacement surgery by Dr. Berlinda Breeze approximately 7 weeks ago.  She tolerated surgery well.  She denies any chest pain or shortness of breath.  She initially had left leg swelling following her surgery and this has improved.  She has continued to be on carvedilol  18.7 5 in the morning and 12.5 mg in the evening, diltiazem  CD2 140 mg daily, lisinopril  40 mg, hydralazine  25 mg twice a day.  She is on atorvastatin  for hyperlipidemia.  Lab work 2 months ago showed hemoglobin A1c at 6.3.  She has aggressively been watching her diet.  During that evaluation her blood pressure was mildly elevated and I recommended slight titration of hydralazine  to 50 mg twice a day with target blood pressure less than 130/80.  I saw her on August 07, 2021.  At that time she felt well but at times after taking her morning medicine she would notice her blood pressure dropping into the upper 90s to low 100s and several hours later she felt improved.  She is to undergo laboratory with Dr. Rankin Kaiser.  She continues to have some issues with sciatica.  She denies chest pain.  She is unaware of palpitations.  At that time she was no longer taking lisinopril  and was taking carvedilol  18.75 mg twice a day, diltiazem  240 mg daily and HCTZ 12.5 mg on an as-needed basis.  With her symptomatology I recommended she reduce  her hydralazine  back down to 25 mg twice a day I recommended she take her diltiazem  240 mg .  I saw her on February 18, 2022.  Since her prior evaluation she has felt well and denied any chest pain..  She admits to ankle swelling which is mild which seems to occur on a daily basis.  She is now on carvedilol  18.75 mg in the morning and 12.5 mg at night, diltiazem  240 mg at night, hydralazine  25 mg twice a day, and rarely has taken HCTZ.  She continues to be on atorvastatin  40 mg for hyperlipidemia.  She is on Neurontin for neuropathy.  She continues to be on Zanaflex as needed for muscle spasms.  At that time I suggested she take HCTZ 12.5 mg initially every other day and depending upon blood pressure and edema adjustments could be made to her usage.  With her age of 80 years I recommended she undergo abdominal aortic aneurysm ultrasound screening.  On March 05, 2022 her ultrasound of her abdominal aorta was normal with no evidence for abdominal aortic aneurysm.  There was evidence for minimal atherosclerosis in the iliac arteries without significant stenosis.  I saw her on August 06, 2022 at which time she felt well from a cardiac standpoint but was having issues with her cervical spine and disc disease involving C3 and C5.  She has continued to be on atorvastatin  40 mg for hyperlipidemia.  She is on carvedilol  18.75 mg in the morning and 12.5 mg at night, diltiazem  240 mg daily, hydralazine  25 mg twice a day, HCTZ 12.5 mg every other day or as needed for swelling.  She sees Dr. Rankin Kaiser in [place]  for primary care.  During that evaluation I recommended slight titration of carvedilol  to 18.75 mg twice a day and recommended follow-up laboratory with an evaluation also for LP(a).  LP(a) was significantly elevated at 1 9523.  I last saw her on May 05, 2023.  Since her prior evaluation she became dehydrated and was hospitalized in late September for 7 days with abdominal pain diarrhea and apparently was  diagnosed with colitis.  She was treated with antibiotics.  With her low blood pressure, she was taken off hydralazine  and hydrochlorothiazide .  Colonoscopy was recommended to be done in [place].  I last saw her on May 05, 2023.  She felt well.  She is on atorvastatin  40 mg and aspirin  for lipid management.  She continues to be on carvedilol  18.75 mg twice a day, diltiazem  240 mg daily, and has a prescription for HCTZ but is not taking presently.  With her LP (a) elevation at 195.3, she also was on aspirin  81 mg.  She was no longer on hydralazine .  She denies any chest pain or increasing shortness of breath.    Since I last saw her, she has felt well.  She was evaluated at the [facility] for primary care in April 2025.  She continues to be on carvedilol  18.75 mg twice a day, diltiazem  240 mg daily for hypertension and heart rate and on atorvastatin  40 mg in addition to aspirin  81 mg daily.  She denies chest pain or shortness of breath.  She is unaware of any palpitations.  She had undergone laboratory on October 19, 2023 which showed a creatinine of 1.15 with estimated GFR at 50, consistent with mild stage IIIa CKD.  Laboratory showed total cholesterol 118, triglycerides 97, HDL 44 and LDL 56.  TSH was 1.58.  She presents for evaluation.   Past medical history is notable for hypertension, palpitations, depressive disorder, osteoarthritis.  Past surgical history is notable for partial thyroidectomy while she was in eighth grade, and recent right knee surgery  Current Outpatient Medications  Medication Sig Dispense Refill   amoxicillin (AMOXIL) 500 MG capsule Take 500 mg by mouth. Before dental procedures     ARTIFICIAL TEAR OP Apply 1 drop to eye daily as needed (dry eyes).     aspirin  EC 81 MG tablet Take 1 tablet (81 mg total) by mouth daily. 90 tablet 3   atorvastatin  (LIPITOR) 40 MG tablet TAKE 1 TABLET(40 MG) BY MOUTH DAILY 90 tablet 3   carvedilol  (COREG ) 12.5 MG tablet TAKE 1 AND  1/2 TABLETS(18.75 MG) BY MOUTH IN THE MORNING AND AT BEDTIME 270 tablet 2   Coenzyme Q10 (COQ-10) 200 MG CAPS Take 200 mg by mouth daily.     diltiazem  (CARDIZEM  CD) 240 MG 24 hr capsule TAKE 1 CAPSULE(240 MG) BY MOUTH DAILY 90 capsule 3   FEROSUL 325 (65 Fe) MG tablet Take 325 mg by mouth daily.     NON FORMULARY Take by mouth.     ondansetron  (ZOFRAN ) 4 MG tablet Take 4 mg by mouth every 8 (eight) hours as needed.     PSYLLIUM PO Take 1 packet by mouth.     tiZANidine (ZANAFLEX) 4 MG tablet Take 4 mg by mouth every 6 (six) hours as needed.     traMADol (ULTRAM) 50 MG tablet Take 50 mg by mouth every 6 (six) hours as needed.     nitroGLYCERIN  (NITROSTAT ) 0.4 MG SL tablet Place 1 tablet (0.4 mg total) under the tongue every 5 (five) minutes  as needed for chest pain. 25 tablet 3   No current facility-administered medications for this visit.   Social history is notable in that she is married for >40 years.  She does not have any children.  She lives with her husband.  She completed 12th grade of education.  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  There is no tobacco history or alcohol use.  She does care for many dogs.  Family History  Problem Relation Age of Onset   Heart failure Mother    Sudden death Father    COPD Father    Cancer Brother    Cancer Sister    Family history is notable that her mother died at age 53 with possible congestive heart failure.  Her father died at 56 with sudden death.  He had COPD.  A brother died at 49 with cancer and a sister died at 26 with cancer.   ROS General: Negative; No fevers, chills, or night sweats HEENT: Negative; No changes in vision or hearing, sinus congestion, difficulty swallowing Pulmonary: Negative; No cough, wheezing, shortness of breath, hemoptysis Cardiovascular:  See HPI;  GI: Recent dehydration, abdominal pain, diarrhea, diagnosed with colitis treated with antibiotics. GU: Occasional  urinary incontinence Musculoskeletal: Positive for arthritis in her right knee; status post left knee replacement Hematologic/Oncologic: Negative; no easy bruising, bleeding Endocrine: History of partial thyroidectomy Neuro: Negative; no changes in balance, headaches Skin: Negative; No rashes or skin lesions Psychiatric: Prior history of depression, currently stable Sleep: Negative; No daytime sleepiness, hypersomnolence, bruxism, restless legs, hypnogagnic hallucinations Other comprehensive 14 point system review is negative   Physical Exam BP (!) 142/58 (BP Location: Left Arm, Patient Position: Sitting, Cuff Size: Normal)   Pulse (!) 54   Ht 5\' 2"  (1.575 m)   Wt 131 lb 12.8 oz (59.8 kg)   LMP 07/27/1998 (LMP Unknown)   BMI 24.11 kg/m    Repeat blood pressure by me was 144/68 and on repeat 138/70.  Wt Readings from Last 3 Encounters:  12/02/23 131 lb 12.8 oz (59.8 kg)  05/05/23 126 lb 12.8 oz (57.5 kg)  08/06/22 122 lb 12.8 oz (55.7 kg)   General: Alert, oriented, no distress.  Skin: normal turgor, no rashes, warm and dry HEENT: Normocephalic, atraumatic. Pupils equal round and reactive to light; sclera anicteric; extraocular muscles intact; Nose without nasal septal hypertrophy Mouth/Parynx benign; Mallinpatti scale 3 Neck: No JVD, no carotid bruits; normal carotid upstroke Lungs: clear to ausculatation and percussion; no wheezing or rales Chest wall: without tenderness to palpitation Heart: PMI not displaced, RRR, s1 s2 normal, 1/6 systolic murmur, no diastolic murmur, no rubs, gallops, thrills, or heaves Abdomen: soft, nontender; no hepatosplenomehaly, BS+; abdominal aorta nontender and not dilated by palpation. Back: no CVA tenderness Pulses 2+ Musculoskeletal: full range of motion, normal strength, no joint deformities Extremities: no clubbing cyanosis or edema, Homan's sign negative  Neurologic: grossly nonfocal; Cranial nerves grossly wnl Psychologic: Normal mood and  affect  EKG Interpretation Date/Time:  Thursday Dec 02 2023 12:03:25 EDT Ventricular Rate:  56 PR Interval:  160 QRS Duration:  138 QT Interval:  450 QTC Calculation: 434 R Axis:   -62  Text Interpretation: Sinus bradycardia Left axis deviation Left bundle branch block When compared with ECG of 05-May-2023 11:48, Left bundle branch block has replaced Incomplete left bundle branch block Confirmed by Magnus Schuller ([id number]) on 12/02/2023 12:26:36 PM    May 05, 2023 ECG (independently read by  me): Sinus bradycardia 56 bpm, right axis deviation, incomplete left bundle branch block.  August 06, 2022 ECG (independently read by me): NSR at 67; PVcs  February 18, 2022 ECG (independently read by me): NSR at 70, LBBB   August 07, 2021 ECG (independently read by me):  Sinus rhythm at 74, [facility]  January 21, 2021 ECG (independently read by me): Sinus Bradycardia at 57, ILBBB  February 2022 ECG (independently read by me): NSR at 60; LBBB; QTc 424 msec, no ectopy  August 2021 ECG (independently read by me): Sinus bradycardia 56 bpm.  Left axis deviation.  Borderline left bundle branch block.  No ectopy.    February 2021 ECG (independently read by me): Sinus bradycardia 55 bpm, left axis deviation, left bundle branch block.   October 2020 ECG (independently read by me): Sinus ryhthm at 70 with PVCs; nonspecific ST changes; QTc 124 msec  July 2020 ECG (independently read by me): Sinus Bradycardia at 56; NSSTT changes  January 2010 ECG (independently read by me): Sinus bradycardia 59 bpm.  Left bundle branch block with repolarization changes.  May 09, 2018 ECG (independently read by me): Sinus bradycardia 55 bpm.  Left bundle branch block with repolarization changes.  No ectopy.  November 08, 2017 ECG (independently read by me): Normal sinus rhythm at 66 bpm with occasional PACs.  Left bundle branch block with mild repolarization  October 2018 ECG (independently read by me): Sinus bradycardia 56 bpm.   Left axis deviation.  Left bundle branch block.  QTc interval 424 ms.  August 2018 ECG (independently read by me): Sinus rhythm at 90 bpm with frequent PVCs with transient bigeminy.  May 2018 ECG (independently read by me): Sinus rhythm at 97 bpm with frequent PVCs.  Left bundle branch block.  PR interval 142.  QTc interval 457 ms.  January 2018 ECG (independently read by me): Sinus rhythm at 64 bpm.  Occasional isolated PVC followed by compensatory pause.  QTc interval 44 ms.  PR interval 128 ms.  No ST segment changes.  July 2017 ECG (independently read by me): Sinus bradycardia 56 bpm.  Normal intervals.  Previously noted T-wave changes in leads 3 and aVF.  ECG (independently read by me): sinus bradycardia 57.  Normal intervals.  Nondiagnostic T changes in lead 27 Nov 2014 ECG (independently read by me): Sinus bradycardia 52 bpm.  ST changes improved.  QTc interval 381 ms.  07/26/2014 ECG (independently read by me): Normal sinus rhythm at 68 bpm.  There is mild downsloping ST segment depression in leads II, III, and F V4 through V6, which is slightly more progressive since the patient's prior ECG done in [place].  LABS: Laboratory from 11/11/2015 including a lipid panel and CBC were reviewed.     Latest Ref Rng & Units 10/19/2023    9:16 AM 08/07/2022    9:26 AM 09/19/2020   12:56 PM  BMP  Glucose 70 - 99 mg/dL 409  811  914   BUN 8 - 27 mg/dL 22  16  20    Creatinine 0.57 - 1.00 mg/dL 7.82  9.56  2.13   BUN/Creat Ratio 12 - 28 19  15  14    Sodium 134 - 144 mmol/L 140  139  141   Potassium 3.5 - 5.2 mmol/L 4.7  4.3  4.4   Chloride 96 - 106 mmol/L 104  104  101   CO2 20 - 29 mmol/L 23  21  23    Calcium  8.7 - 10.3 mg/dL  9.2  8.8  10.5       Latest Ref Rng & Units 10/19/2023    9:16 AM 08/07/2022    9:26 AM 09/19/2020   12:56 PM  Hepatic Function  Total Protein 6.0 - 8.5 g/dL 6.3  5.8  6.5   Albumin 3.8 - 4.8 g/dL 4.0  4.0  4.4   AST 0 - 40 IU/L 25  18  15    ALT 0 - 32 IU/L  21  15  13    Alk Phosphatase 44 - 121 IU/L 84  77  91   Total Bilirubin 0.0 - 1.2 mg/dL 0.3  0.4  0.3       Latest Ref Rng & Units 10/19/2023    9:16 AM 08/07/2022    9:26 AM 09/19/2020   12:56 PM  CBC  WBC 3.4 - 10.8 x10E3/uL 8.9  12.0  6.5   Hemoglobin 11.1 - 15.9 g/dL 16.1  09.6  04.5   Hematocrit 34.0 - 46.6 % 37.4  33.2  39.5   Platelets 150 - 450 x10E3/uL 269  225  278    Lab Results  Component Value Date   MCV 93 10/19/2023   MCV 89 08/07/2022   MCV 91 09/19/2020   Lab Results  Component Value Date   TSH 1.580 10/19/2023  No results found for: "HGBA1C"  Lipid Panel     Component Value Date/Time   CHOL 118 10/19/2023 0916   TRIG 97 10/19/2023 0916   HDL 44 10/19/2023 0916   CHOLHDL 2.7 10/19/2023 0916   CHOLHDL 3.9 02/14/2015 0400   VLDL 22 02/14/2015 0400   LDLCALC 56 10/19/2023 0916   I personally reviewed the blood work done at [facility] in clinic from 11/02/2017: Potassium 4.6.  Creatinine 1.27.  Normal LFTs.  Cholesterol 144, triglycerides 90, HDL 43, LDL 82.  TSH 0.96.   RADIOLOGY: No results found.  IMPRESSION:  1. Essential hypertension   2. CAD in native artery   3. Elevated Lp(a)   4. Hyperlipidemia LDL goal < 50   5. Left bundle branch block   6. Abdominal bruit   7. History of colitis    ASSESSMENT AND PLAN:  Ms.Hege is a 74 year-old female who has a long-standing history of hypertension, which remotely improved with the addition of amlodipine  5 mg added to her Ziac and lisinopril  therapy.  In 2015 she had experienced episodic chest tightness. Cardiac catheterization performed after a nuclear perfusion study raised the possibility of anteroseptal defect demonstrated smooth 40-50% LAD stenosis which did not completely normalize following intracoronary nitroglycerin , and otherwise normal coronary arteries.  She has remained stable without recurrent episodes of chest pain.  Lipid studies were still elevated in 2017 and  atorvastatin  was increased  to 40 mg with improved results. 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  She had an MRI, CT, carotid Doppler studies, and echo Doppler study showed normal ejection fraction at 55-60%.  There was no obvious evidence of a PFO on color Doppler or bubble study. A 2 week event monitor in June 2018 showed sinus rhythm with occasional PACs.  There were episodes of sinus tachycardia.  There was one episode of PSVT with heart rates of 144 bpm.  Of note, there was also an episode of sinus tachycardia at 5:30 AM while sleeping.  Remotely, due to fatigability on metoprolol  she was switched to Bystolic   and most recently has been on carvedilol  18.75 mg in the morning and 12.5 mg at night in addition to diltiazem  240 mg daily, hydralazine  25 mg twice a day and HCTZ as needed depending upon leg swelling.  Carvedilol  was increased to 18.75 mg in January 2024.  Her abdominal ultrasound did not reveal any evidence for abdominal aortic aneurysm as a cause for her soft bruit.  She  was hospitalized in the setting of dehydration, abdominal pain, was felt to be septic and found to have colitis requiring antibiotic therapy.  She had blood in her feces and also low iron levels.  She subsequently has undergone colonoscopy.  She has continued to be on carvedilol  18.75 mg twice a day and Cardizem  CD 240 mg daily.  Resting pulse today is 54.  She is unaware of any tachycardia or recurrent episodes of PSVT.  She denies chest pain or shortness of breath.  Repeat blood pressure by me today was 138/70.  She continues to be on atorvastatin  40 mg and is on baby aspirin  with a elevated LP(a) at 195.3.  Recent laboratory from March 26 shows total cholesterol 118 triglycerides 97 HDL 44 and LDL 56.  Baseline creatinine was 1.15 with estimated GFR 50 consistent with mild stage IIIa CKD.  She is aware of my upcoming retirement next  month.  I will transition both she and her husband to the care of Dr. Carson Clara for follow-up Cardiologic evaluation.   Sarah Ally, MD, [facility] 12/04/2023 9:33 AM

## 2023-12-04 ENCOUNTER — Encounter: Payer: Self-pay | Admitting: Cardiovascular Disease

## 2024-05-28 NOTE — Progress Notes (Unsigned)
 Cardiology Office Note:    Date:  05/30/2024   ID:  [name], DOB 1950-09-25, MRN [id number]  PCP:  Hanna Fallow, MD  Cardiologist:  Lonni LITTIE Nanas, MD  Electrophysiologist:  None   Referring MD: Hanna Fallow, MD   Chief Complaint  Patient presents with   Dizziness    History of Present Illness:    [name] is a 73 y.o. female with a hx of hypertension, hyperlipidemia who presents for follow-up.  Previously followed with Dr. Burnard.  Reported chest pain and underwent cardiac catheterization in 2015 which showed 40 to 50% LAD stenosis, otherwise normal coronary arteries.  Carotid duplex in 2018 showed normal carotid arteries.  Echocardiogram 11/2016 showed EF 55 to 60%, normal RV function, no significant valvular disease.  Cardiac monitor in 2018 showed 8% PVC burden.  Since last clinic visit, she is reporting lightheadedness.  Happening several times per week.  Denies any syncope but reports has had near syncopal episodes.  Also reports having burning in her chest.  Reports in center chest, can last up to 1 minute.  Has not noted any clear cause.   Past Medical History:  Diagnosis Date   Depressive disorder    Dyslipidemia    Hypertension    Nonalcoholic liver disease, chronic    Osteopenia    Palpitations     Past Surgical History:  Procedure Laterality Date   KNEE ARTHROPLASTY Right 09/28/2016   Procedure: COMPUTER ASSISTED TOTAL KNEE ARTHROPLASTY;  Surgeon: Redell Shoals, MD;  Location: [facility] OR;  Service: Orthopedics;  Laterality: Right;   LEFT HEART CATHETERIZATION WITH CORONARY ANGIOGRAM N/A 08/03/2014   Procedure: LEFT HEART CATHETERIZATION WITH CORONARY ANGIOGRAM;  Surgeon: Debby DELENA Burnard, MD;  Location: [facility] CATH LAB;  Service: Cardiovascular;  Laterality: N/A;   THYROIDECTOMY, PARTIAL      Current Medications: Current Meds  Medication Sig   ARTIFICIAL TEAR OP Apply 1 drop to eye daily as needed (dry eyes).   aspirin  EC 81 MG tablet Take  1 tablet (81 mg total) by mouth daily.   atorvastatin  (LIPITOR) 40 MG tablet TAKE 1 TABLET(40 MG) BY MOUTH DAILY   carvedilol  (COREG ) 12.5 MG tablet TAKE 1 AND 1/2 TABLETS(18.75 MG) BY MOUTH IN THE MORNING AND AT BEDTIME   cholecalciferol  (VITAMIN D3) 25 MCG (1000 UNIT) tablet Take 1,000 Units by mouth daily.   Coenzyme Q10 (COQ-10) 200 MG CAPS Take 200 mg by mouth daily.   diltiazem  (CARDIZEM  CD) 120 MG 24 hr capsule Take 1 capsule (120 mg total) by mouth daily.   FEROSUL 325 (65 Fe) MG tablet Take 325 mg by mouth daily.   nitroGLYCERIN  (NITROSTAT ) 0.4 MG SL tablet Place 1 tablet (0.4 mg total) under the tongue every 5 (five) minutes as needed for chest pain.   PSYLLIUM PO Take 1 packet by mouth.   traMADol (ULTRAM) 50 MG tablet Take 50 mg by mouth every 6 (six) hours as needed.   [DISCONTINUED] diltiazem  (CARDIZEM  CD) 240 MG 24 hr capsule TAKE 1 CAPSULE(240 MG) BY MOUTH DAILY     Allergies:   No known allergies and Tape   Social History   Socioeconomic History   Marital status: Married    Spouse name: Not on file   Number of children: Not on file   Years of education: Not on file   Highest education level: Not on file  Occupational History   Not on file  Tobacco Use   Smoking status: Never   Smokeless tobacco: Never  Substance and Sexual Activity   Alcohol use: No    Alcohol/week: 0.0 standard drinks of alcohol   Drug use: No   Sexual activity: Not on file  Other Topics Concern   Not on file  Social History Narrative   Not on file   Social Drivers of Health   Financial Resource Strain: Not on file  Food Insecurity: Not on file  Transportation Needs: Not on file  Physical Activity: Not on file  Stress: Not on file  Social Connections: Not on file     Family History: The patient's family history includes COPD in her father; Cancer in her brother and sister; Heart failure in her mother; Sudden death in her father.  ROS:   Please see the history of present illness.      All other systems reviewed and are negative.  EKGs/Labs/Other Studies Reviewed:    The following studies were reviewed today:   EKG:   05/30/2024: Sinus bradycardia, rate 51, left bundle branch block  Recent Labs: 10/19/2023: ALT 21; BUN 22; Creatinine, Ser 1.15; Hemoglobin 12.4; Platelets 269; Potassium 4.7; Sodium 140; TSH 1.580  Recent Lipid Panel    Component Value Date/Time   CHOL 118 10/19/2023 0916   TRIG 97 10/19/2023 0916   HDL 44 10/19/2023 0916   CHOLHDL 2.7 10/19/2023 0916   CHOLHDL 3.9 02/14/2015 0400   VLDL 22 02/14/2015 0400   LDLCALC 56 10/19/2023 0916    Physical Exam:    VS:  BP (!) 102/50 (BP Location: Left Arm, Patient Position: Sitting)   Pulse (!) 55   Resp 14   Ht 5' 2 (1.575 m)   Wt 132 lb 3.2 oz (60 kg)   LMP 07/27/1998 (LMP Unknown)   SpO2 99%   BMI 24.18 kg/m     Wt Readings from Last 3 Encounters:  05/30/24 132 lb 3.2 oz (60 kg)  12/02/23 131 lb 12.8 oz (59.8 kg)  05/05/23 126 lb 12.8 oz (57.5 kg)     GEN:  Well nourished, well developed in no acute distress HEENT: Normal NECK: No JVD; No carotid bruits LYMPHATICS: No lymphadenopathy CARDIAC: RRR, no murmurs, rubs, gallops RESPIRATORY:  Clear to auscultation without rales, wheezing or rhonchi  ABDOMEN: Soft, non-tender, non-distended MUSCULOSKELETAL:  No edema; No deformity  SKIN: Warm and dry NEUROLOGIC:  Alert and oriented x 3 PSYCHIATRIC:  Normal affect   ASSESSMENT:    1. CAD in native artery   2. Chest pain, unspecified type   3. Near syncope   4. PVC's (premature ventricular contractions)   5. Essential hypertension   6. Hyperlipidemia LDL goal <70    PLAN:    CAD: underwent cardiac catheterization in 2015 which showed 40 to 50% LAD stenosis, otherwise normal coronary arteries. Echocardiogram 11/2016 showed EF 55 to 60%, normal RV function, no significant valvular disease.   - She is reporting recent chest pain, will evaluate with coronary CTA.  Can continue home  carvedilol  for study.  Check BMET  Lightheadedness/near syncope: She is on both carvedilol  18.75 mg twice daily and diltiazem  240 mg daily and bradycardic and soft BP in clinic today, suspect the combination of carvedilol  and diltiazem  is causing her symptoms.  Recommended weaning off diltiazem .  Will reduce to 120 mg daily for 1 week then discontinue.  Will continue carvedilol .  Recommend Zio patch x 7 days once off diltiazem   PVCs: Cardiac monitor in 2018 showed 8% PVC burden.  On carvedilol  and diltiazem  as above, will plan to wean  off diltiazem  and repeat monitor to evaluate PVC burden  Hypertension: On carvedilol  18.75 mg twice daily and diltiazem  240 mg daily.  Soft BP as above, will wean off diltiazem   Hyperlipidemia: On atorvastatin  40 mg daily.  LDL 56 on 10/19/2023  RTC in 3 months   Medication Adjustments/Labs and Tests Ordered: Current medicines are reviewed at length with the patient today.  Concerns regarding medicines are outlined above.  Orders Placed This Encounter  Procedures   CT CORONARY MORPH W/CTA COR W/SCORE W/CA W/CM &/OR WO/CM   Basic metabolic panel with GFR   LONG TERM MONITOR (3-14 DAYS)   EKG 12-Lead   Meds ordered this encounter  Medications   diltiazem  (CARDIZEM  CD) 120 MG 24 hr capsule    Sig: Take 1 capsule (120 mg total) by mouth daily.    Dispense:  7 capsule    Refill:  0    Patient Instructions  Medication Instructions:  Decrease Diltiazem  to 120 mg once daily. After 7 days, STOP Diltiazem  *If you need a refill on your cardiac medications before your next appointment, please call your pharmacy*  Lab Work: BMET today at [facility] If you have labs (blood work) drawn today and your tests are completely normal, you will receive your results only by: MyChart Message (if you have MyChart) OR A paper copy in the mail If you have any lab test that is abnormal or we need to change your treatment, we will call you to review the  results.  Testing/Procedures: 7 Day Zio Heart Monitor (will be sent in 2 weeks) Your physician has requested that you wear a Zio heart monitor for _7____ days. This will be mailed to your home with instructions on how to apply the monitor and how to return it when finished. Please allow 2 weeks after returning the heart monitor before our office calls you with the results.   Coronary CTA  Follow-Up: At [facility], you and your health needs are our priority.  As part of our continuing mission to provide you with exceptional heart care, our providers are all part of one team.  This team includes your primary Cardiologist (physician) and Advanced Practice Providers or APPs (Physician Assistants and Nurse Practitioners) who all work together to provide you with the care you need, when you need it.  Your next appointment:   3 month(s)  Provider:   Lonni LITTIE Nanas, MD    We recommend signing up for the patient portal called MyChart.  Sign up information is provided on this After Visit Summary.  MyChart is used to connect with patients for Virtual Visits (Telemedicine).  Patients are able to view lab/test results, encounter notes, upcoming appointments, etc.  Non-urgent messages can be sent to your provider as well.   To learn more about what you can do with MyChart, go to [email or web address].   Other Instructions ZIO XT- Long Term Monitor Instructions  Your physician has requested you wear a ZIO patch monitor for 7 days.  This is a single patch monitor. [employer] supplies one patch monitor per enrollment. Additional stickers are not available. Please do not apply patch if you will be having a Nuclear Stress Test,  Echocardiogram, Cardiac CT, MRI, or Chest Xray during the period you would be wearing the  monitor. The patch cannot be worn during these tests. You cannot remove and re-apply the  ZIO XT patch monitor.  Your ZIO patch monitor will be mailed 3 day USPS to your  address on file.  It may take 3-5 days  to receive your monitor after you have been enrolled.  Once you have received your monitor, please review the enclosed instructions. Your monitor  has already been registered assigning a specific monitor serial # to you.  Billing and Patient Assistance Program Information  We have supplied [employer] with any of your insurance information on file for billing purposes. [employer] offers a sliding scale Patient Assistance Program for patients that do not have  insurance, or whose insurance does not completely cover the cost of the ZIO monitor.  You must apply for the Patient Assistance Program to qualify for this discounted rate.  To apply, please call [employer] at [phone], select option 4, select option 2, ask to apply for  Patient Assistance Program. Meredeth will ask your household income, and how many people  are in your household. They will quote your out-of-pocket cost based on that information.  [employer] will also be able to set up a 85-month, interest-free payment plan if needed.  Applying the monitor   Shave hair from upper left chest.  Hold abrader disc by orange tab. Rub abrader in 40 strokes over the upper left chest as  indicated in your monitor instructions.  Clean area with 4 enclosed alcohol pads. Let dry.  Apply patch as indicated in monitor instructions. Patch will be placed under collarbone on left  side of chest with arrow pointing upward.  Rub patch adhesive wings for 2 minutes. Remove white label marked 1. Remove the white  label marked 2. Rub patch adhesive wings for 2 additional minutes.  While looking in a mirror, press and release button in center of patch. A small green light will  flash 3-4 times. This will be your only indicator that the monitor has been turned on.  Do not shower for the first 24 hours. You may shower after the first 24 hours.  Press the button if you feel a symptom. You will hear a small click. Record  Date, Time and  Symptom in the Patient Logbook.  When you are ready to remove the patch, follow instructions on the last 2 pages of Patient  Logbook. Stick patch monitor onto the last page of Patient Logbook.  Place Patient Logbook in the blue and white box. Use locking tab on box and tape box closed  securely. The blue and white box has prepaid postage on it. Please place it in the mailbox as  soon as possible. Your physician should have your test results approximately 7 days after the  monitor has been mailed back to [facility].  Call [facility] Customer Care at [phone] if you have questions regarding  your ZIO XT patch monitor. Call them immediately if you see an orange light blinking on your  monitor.  If your monitor falls off in less than 4 days, contact our Monitor department at [phone].  If your monitor becomes loose or falls off after 4 days call [employer] at [phone] for  suggestions on securing your monitor    Coronary CTA Instructions   Your cardiac CT will be scheduled at one of the below locations:   [facility] [address]  [place], [place] [address]  If scheduled at the Heart and V[facility] Tower at [address], please enter the parking lot using the [address] entrance and use the FREE valet service at the patient drop-off area. Enter the building and check-in with registration on the main floor.  Please follow these instructions carefully (  unless otherwise directed):  An IV will be required for this test and Nitroglycerin  will be given.  Hold all erectile dysfunction medications at least 3 days (72 hrs) prior to test. (Ie viagra, cialis, sildenafil, tadalafil, etc)   On the Night Before the Test: Be sure to Drink plenty of water. Do not consume any caffeinated/decaffeinated beverages or chocolate 12 hours prior to your test. Do not take any antihistamines 12 hours prior to your test.  On the Day of the  Test: Drink plenty of water until 1 hour prior to the test. Do not eat any food 1 hour prior to test. You may take your regular medications prior to the test.  If you take Furosemide/Hydrochlorothiazide /Spironolactone/Chlorthalidone, please HOLD on the morning of the test. Patients who wear a continuous glucose monitor MUST remove the device prior to scanning. FEMALES- please wear underwire-free bra if available, avoid dresses & tight clothing      After the Test: Drink plenty of water. After receiving IV contrast, you may experience a mild flushed feeling. This is normal. On occasion, you may experience a mild rash up to 24 hours after the test. This is not dangerous. If this occurs, you can take Benadryl  25 mg, Zyrtec, Claritin, or Allegra and increase your fluid intake. (Patients taking Tikosyn should avoid Benadryl , and may take Zyrtec, Claritin, or Allegra) If you experience trouble breathing, this can be serious. If it is severe call 911 IMMEDIATELY. If it is mild, please call our office.  We will call to schedule your test 2-4 weeks out understanding that some insurance companies will need an authorization prior to the service being performed.   For more information and frequently asked questions, please visit our website : [email or web address]  For non-scheduling related questions, please contact the cardiac imaging nurse navigator should you have any questions/concerns: Cardiac Imaging Nurse Navigators Direct Office Dial: [phone]   For scheduling needs, including cancellations and rescheduling, please call Brittany, [phone].          Signed, Lonni LITTIE Nanas, MD  05/30/2024 10:47 AM    [facility]

## 2024-05-30 ENCOUNTER — Ambulatory Visit

## 2024-05-30 ENCOUNTER — Ambulatory Visit: Attending: Cardiology | Admitting: Cardiology

## 2024-05-30 VITALS — BP 102/50 | HR 55 | Resp 14 | Ht 62.0 in | Wt 132.2 lb

## 2024-05-30 DIAGNOSIS — I2510 Atherosclerotic heart disease of native coronary artery without angina pectoris: Secondary | ICD-10-CM | POA: Insufficient documentation

## 2024-05-30 DIAGNOSIS — I10 Essential (primary) hypertension: Secondary | ICD-10-CM | POA: Insufficient documentation

## 2024-05-30 DIAGNOSIS — R55 Syncope and collapse: Secondary | ICD-10-CM | POA: Insufficient documentation

## 2024-05-30 DIAGNOSIS — R079 Chest pain, unspecified: Secondary | ICD-10-CM | POA: Diagnosis not present

## 2024-05-30 DIAGNOSIS — I493 Ventricular premature depolarization: Secondary | ICD-10-CM | POA: Diagnosis not present

## 2024-05-30 DIAGNOSIS — E785 Hyperlipidemia, unspecified: Secondary | ICD-10-CM | POA: Insufficient documentation

## 2024-05-30 MED ORDER — DILTIAZEM HCL ER COATED BEADS 120 MG PO CP24: 120.0000 mg | ORAL_CAPSULE | Freq: Every day | ORAL | 0 refills | Status: AC

## 2024-05-30 NOTE — Patient Instructions (Addendum)
 Medication Instructions:  Decrease Diltiazem  to 120 mg once daily. After 7 days, STOP Diltiazem  *If you need a refill on your cardiac medications before your next appointment, please call your pharmacy*  Lab Work: BMET today at [employer] [facility] If you have labs (blood work) drawn today and your tests are completely normal, you will receive your results only by: MyChart Message (if you have MyChart) OR A paper copy in the mail If you have any lab test that is abnormal or we need to change your treatment, we will call you to review the results.  Testing/Procedures: 7 Day Zio Heart Monitor (will be sent in 2 weeks) Your physician has requested that you wear a Zio heart monitor for _7____ days. This will be mailed to your home with instructions on how to apply the monitor and how to return it when finished. Please allow 2 weeks after returning the heart monitor before our office calls you with the results.   Coronary CTA  Follow-Up: At [facility], you and your health needs are our priority.  As part of our continuing mission to provide you with exceptional heart care, our providers are all part of one team.  This team includes your primary Cardiologist (physician) and Advanced Practice Providers or APPs (Physician Assistants and Nurse Practitioners) who all work together to provide you with the care you need, when you need it.  Your next appointment:   3 month(s)  Provider:   Lonni LITTIE Nanas, MD    We recommend signing up for the patient portal called MyChart.  Sign up information is provided on this After Visit Summary.  MyChart is used to connect with patients for Virtual Visits (Telemedicine).  Patients are able to view lab/test results, encounter notes, upcoming appointments, etc.  Non-urgent messages can be sent to your provider as well.   To learn more about what you can do with MyChart, go to [email or web address].   Other Instructions ZIO XT- Long Term Monitor  Instructions  Your physician has requested you wear a ZIO patch monitor for 7 days.  This is a single patch monitor. [employer] supplies one patch monitor per enrollment. Additional stickers are not available. Please do not apply patch if you will be having a Nuclear Stress Test,  Echocardiogram, Cardiac CT, MRI, or Chest Xray during the period you would be wearing the  monitor. The patch cannot be worn during these tests. You cannot remove and re-apply the  ZIO XT patch monitor.  Your ZIO patch monitor will be mailed 3 day USPS to your address on file. It may take 3-5 days  to receive your monitor after you have been enrolled.  Once you have received your monitor, please review the enclosed instructions. Your monitor  has already been registered assigning a specific monitor serial # to you.  Billing and Patient Assistance Program Information  We have supplied [employer] with any of your insurance information on file for billing purposes. [employer] offers a sliding scale Patient Assistance Program for patients that do not have  insurance, or whose insurance does not completely cover the cost of the ZIO monitor.  You must apply for the Patient Assistance Program to qualify for this discounted rate.  To apply, please call [employer] at [phone], select option 4, select option 2, ask to apply for  Patient Assistance Program. Meredeth will ask your household income, and how many people  are in your household. They will quote your out-of-pocket cost based on that information.  [employer] will also  be able to set up a 36-month, interest-free payment plan if needed.  Applying the monitor   Shave hair from upper left chest.  Hold abrader disc by orange tab. Rub abrader in 40 strokes over the upper left chest as  indicated in your monitor instructions.  Clean area with 4 enclosed alcohol pads. Let dry.  Apply patch as indicated in monitor instructions. Patch will be placed under collarbone on left  side  of chest with arrow pointing upward.  Rub patch adhesive wings for 2 minutes. Remove white label marked 1. Remove the white  label marked 2. Rub patch adhesive wings for 2 additional minutes.  While looking in a mirror, press and release button in center of patch. A small green light will  flash 3-4 times. This will be your only indicator that the monitor has been turned on.  Do not shower for the first 24 hours. You may shower after the first 24 hours.  Press the button if you feel a symptom. You will hear a small click. Record Date, Time and  Symptom in the Patient Logbook.  When you are ready to remove the patch, follow instructions on the last 2 pages of Patient  Logbook. Stick patch monitor onto the last page of Patient Logbook.  Place Patient Logbook in the blue and white box. Use locking tab on box and tape box closed  securely. The blue and white box has prepaid postage on it. Please place it in the mailbox as  soon as possible. Your physician should have your test results approximately 7 days after the  monitor has been mailed back to [facility].  Call [facility] Customer Care at [phone] if you have questions regarding  your ZIO XT patch monitor. Call them immediately if you see an orange light blinking on your  monitor.  If your monitor falls off in less than 4 days, contact our Monitor department at [phone].  If your monitor becomes loose or falls off after 4 days call [employer] at [phone] for  suggestions on securing your monitor    Coronary CTA Instructions   Your cardiac CT will be scheduled at one of the below locations:   [facility] [address]  [place], [place] [address]  If scheduled at the [facility] at [address], please enter the parking lot using the [address] entrance and use the FREE valet service at the patient drop-off area. Enter the building and check-in with registration on  the main floor.  Please follow these instructions carefully (unless otherwise directed):  An IV will be required for this test and Nitroglycerin  will be given.  Hold all erectile dysfunction medications at least 3 days (72 hrs) prior to test. (Ie viagra, cialis, sildenafil, tadalafil, etc)   On the Night Before the Test: Be sure to Drink plenty of water. Do not consume any caffeinated/decaffeinated beverages or chocolate 12 hours prior to your test. Do not take any antihistamines 12 hours prior to your test.  On the Day of the Test: Drink plenty of water until 1 hour prior to the test. Do not eat any food 1 hour prior to test. You may take your regular medications prior to the test.  If you take Furosemide/Hydrochlorothiazide /Spironolactone/Chlorthalidone, please HOLD on the morning of the test. Patients who wear a continuous glucose monitor MUST remove the device prior to scanning. FEMALES- please wear underwire-free bra if available, avoid dresses & tight clothing  After the Test: Drink plenty of water. After receiving IV contrast, you may experience a mild flushed feeling. This is normal. On occasion, you may experience a mild rash up to 24 hours after the test. This is not dangerous. If this occurs, you can take Benadryl  25 mg, Zyrtec, Claritin, or Allegra and increase your fluid intake. (Patients taking Tikosyn should avoid Benadryl , and may take Zyrtec, Claritin, or Allegra) If you experience trouble breathing, this can be serious. If it is severe call 911 IMMEDIATELY. If it is mild, please call our office.  We will call to schedule your test 2-4 weeks out understanding that some insurance companies will need an authorization prior to the service being performed.   For more information and frequently asked questions, please visit our website : [email or web address]  For non-scheduling related questions, please contact the cardiac imaging nurse navigator should you  have any questions/concerns: Cardiac Imaging Nurse Navigators Direct Office Dial: [phone]   For scheduling needs, including cancellations and rescheduling, please call Brittany, [phone].

## 2024-05-30 NOTE — Progress Notes (Unsigned)
 Enrolled for [employer] to mail a ZIO XT long term holter monitor to the patients address on file.   Requested delivery 06/13/2024.

## 2024-05-31 ENCOUNTER — Ambulatory Visit: Payer: Self-pay | Admitting: Cardiology

## 2024-05-31 LAB — BASIC METABOLIC PANEL WITH GFR
BUN/Creatinine Ratio: 17 (ref 12–28)
BUN: 22 mg/dL (ref 8–27)
CO2: 23 mmol/L (ref 20–29)
Calcium: 8.9 mg/dL (ref 8.7–10.3)
Chloride: 103 mmol/L (ref 96–106)
Creatinine, Ser: 1.32 mg/dL — ABNORMAL HIGH (ref 0.57–1.00)
Glucose: 124 mg/dL — ABNORMAL HIGH (ref 70–99)
Potassium: 4.4 mmol/L (ref 3.5–5.2)
Sodium: 141 mmol/L (ref 134–144)
eGFR: 42 mL/min/1.73 — ABNORMAL LOW (ref >59)

## 2024-07-05 ENCOUNTER — Encounter (HOSPITAL_COMMUNITY): Payer: Self-pay

## 2024-07-17 ENCOUNTER — Ambulatory Visit (HOSPITAL_COMMUNITY)

## 2024-07-18 ENCOUNTER — Encounter (HOSPITAL_COMMUNITY): Payer: Self-pay

## 2024-07-18 DIAGNOSIS — R55 Syncope and collapse: Secondary | ICD-10-CM

## 2024-07-21 ENCOUNTER — Ambulatory Visit (HOSPITAL_COMMUNITY)
Admission: RE | Admit: 2024-07-21 | Discharge: 2024-07-21 | Disposition: A | Discharge status: Home / Self Care | Source: Ambulatory Visit | Attending: Cardiology | Admitting: Cardiology

## 2024-07-21 ENCOUNTER — Other Ambulatory Visit: Payer: Self-pay | Admitting: Cardiology

## 2024-07-21 ENCOUNTER — Ambulatory Visit (HOSPITAL_BASED_OUTPATIENT_CLINIC_OR_DEPARTMENT_OTHER)
Admission: RE | Admit: 2024-07-21 | Discharge: 2024-07-21 | Disposition: A | Discharge status: Home / Self Care | Source: Ambulatory Visit | Attending: Cardiology | Admitting: Cardiology

## 2024-07-21 DIAGNOSIS — I2510 Atherosclerotic heart disease of native coronary artery without angina pectoris: Secondary | ICD-10-CM | POA: Insufficient documentation

## 2024-07-21 DIAGNOSIS — R079 Chest pain, unspecified: Secondary | ICD-10-CM | POA: Diagnosis present

## 2024-07-21 DIAGNOSIS — R931 Abnormal findings on diagnostic imaging of heart and coronary circulation: Secondary | ICD-10-CM | POA: Diagnosis present

## 2024-07-21 MED ORDER — IOHEXOL 350 MG/ML SOLN
100.0000 mL | Freq: Once | INTRAVENOUS | Status: AC | PRN
  Administered 2024-07-21: 100 mL via INTRAVENOUS

## 2024-07-21 MED ORDER — NITROGLYCERIN 0.4 MG SL SUBL
0.8000 mg | SUBLINGUAL_TABLET | Freq: Once | SUBLINGUAL | Status: AC
  Administered 2024-07-21: 0.8 mg via SUBLINGUAL

## 2024-09-22 ENCOUNTER — Ambulatory Visit: Admitting: Cardiology
